# Patient Record
Sex: Female | Born: 1945
Health system: Southern US, Community
[De-identification: ages and names within clinical notes are randomized; demographics above are authoritative.]

## PROBLEM LIST (undated history)

## (undated) DIAGNOSIS — E669 Obesity, unspecified: Secondary | ICD-10-CM

## (undated) DIAGNOSIS — I10 Essential (primary) hypertension: Secondary | ICD-10-CM

## (undated) DIAGNOSIS — D699 Hemorrhagic condition, unspecified: Secondary | ICD-10-CM

## (undated) DIAGNOSIS — R079 Chest pain, unspecified: Secondary | ICD-10-CM

## (undated) DIAGNOSIS — N959 Unspecified menopausal and perimenopausal disorder: Secondary | ICD-10-CM

## (undated) DIAGNOSIS — E7800 Pure hypercholesterolemia, unspecified: Secondary | ICD-10-CM

## (undated) DIAGNOSIS — D219 Benign neoplasm of connective and other soft tissue, unspecified: Secondary | ICD-10-CM

## (undated) DIAGNOSIS — E119 Type 2 diabetes mellitus without complications: Secondary | ICD-10-CM

## (undated) DIAGNOSIS — F411 Generalized anxiety disorder: Secondary | ICD-10-CM

## (undated) HISTORY — DX: Chest pain, unspecified: R07.9

## (undated) HISTORY — PX: BREAST BIOPSY: SHX20

## (undated) HISTORY — DX: Type 2 diabetes mellitus without complications: E11.9

## (undated) HISTORY — PX: OVARIAN CYST SURGERY: SHX726

## (undated) HISTORY — DX: Obesity, unspecified: E66.9

## (undated) HISTORY — PX: FOOT SURGERY: SHX648

## (undated) HISTORY — PX: OOPHORECTOMY: SHX86

## (undated) HISTORY — DX: Generalized anxiety disorder: F41.1

## (undated) HISTORY — PX: SALPINGOOPHORECTOMY: SHX82

## (undated) HISTORY — DX: Essential (primary) hypertension: I10

## (undated) HISTORY — DX: Pure hypercholesterolemia, unspecified: E78.00

## (undated) HISTORY — PX: MOUTH SURGERY: SHX715

## (undated) HISTORY — DX: Hemorrhagic condition, unspecified: D69.9

## (undated) HISTORY — PX: CHOLECYSTECTOMY: SHX55

## (undated) HISTORY — DX: Benign neoplasm of connective and other soft tissue, unspecified: D21.9

## (undated) HISTORY — DX: Unspecified menopausal and perimenopausal disorder: N95.9

## (undated) HISTORY — PX: CATARACT EXTRACTION, BILATERAL: SHX1313

---

## 1981-10-09 HISTORY — PX: ABDOMINAL HYSTERECTOMY: SHX81

## 1999-03-18 ENCOUNTER — Encounter: Admission: RE | Admit: 1999-03-18 | Discharge: 1999-06-16 | Payer: Self-pay | Admitting: Internal Medicine

## 1999-06-21 ENCOUNTER — Encounter: Admission: RE | Admit: 1999-06-21 | Discharge: 1999-09-19 | Payer: Self-pay | Admitting: Internal Medicine

## 1999-12-28 ENCOUNTER — Encounter: Admission: RE | Admit: 1999-12-28 | Discharge: 1999-12-28 | Payer: Self-pay | Admitting: Family Medicine

## 1999-12-28 ENCOUNTER — Encounter: Payer: Self-pay | Admitting: Family Medicine

## 2000-02-27 ENCOUNTER — Ambulatory Visit (HOSPITAL_COMMUNITY): Admission: RE | Admit: 2000-02-27 | Discharge: 2000-02-27 | Payer: Self-pay | Admitting: Gastroenterology

## 2000-03-06 ENCOUNTER — Ambulatory Visit (HOSPITAL_COMMUNITY): Admission: RE | Admit: 2000-03-06 | Discharge: 2000-03-06 | Payer: Self-pay | Admitting: Gastroenterology

## 2000-04-02 ENCOUNTER — Encounter: Payer: Self-pay | Admitting: General Surgery

## 2000-04-02 ENCOUNTER — Ambulatory Visit (HOSPITAL_COMMUNITY): Admission: RE | Admit: 2000-04-02 | Discharge: 2000-04-02 | Payer: Self-pay | Admitting: General Surgery

## 2000-05-11 ENCOUNTER — Inpatient Hospital Stay (HOSPITAL_COMMUNITY): Admission: EM | Admit: 2000-05-11 | Discharge: 2000-05-12 | Payer: Self-pay | Admitting: General Surgery

## 2001-10-11 ENCOUNTER — Other Ambulatory Visit: Admission: RE | Admit: 2001-10-11 | Discharge: 2001-10-11 | Payer: Self-pay | Admitting: Obstetrics and Gynecology

## 2002-06-11 ENCOUNTER — Encounter: Payer: Self-pay | Admitting: Family Medicine

## 2002-06-11 ENCOUNTER — Encounter: Admission: RE | Admit: 2002-06-11 | Discharge: 2002-06-11 | Payer: Self-pay | Admitting: Family Medicine

## 2003-03-19 ENCOUNTER — Other Ambulatory Visit: Admission: RE | Admit: 2003-03-19 | Discharge: 2003-03-19 | Payer: Self-pay | Admitting: Obstetrics and Gynecology

## 2003-05-12 ENCOUNTER — Encounter: Admission: RE | Admit: 2003-05-12 | Discharge: 2003-05-12 | Payer: Self-pay | Admitting: Family Medicine

## 2003-05-12 ENCOUNTER — Encounter: Payer: Self-pay | Admitting: Family Medicine

## 2003-07-21 ENCOUNTER — Encounter: Admission: RE | Admit: 2003-07-21 | Discharge: 2003-10-19 | Payer: Self-pay | Admitting: Family Medicine

## 2004-03-22 ENCOUNTER — Other Ambulatory Visit: Admission: RE | Admit: 2004-03-22 | Discharge: 2004-03-22 | Payer: Self-pay | Admitting: Obstetrics and Gynecology

## 2004-05-12 ENCOUNTER — Encounter: Admission: RE | Admit: 2004-05-12 | Discharge: 2004-05-12 | Payer: Self-pay | Admitting: Obstetrics and Gynecology

## 2004-08-19 ENCOUNTER — Ambulatory Visit: Payer: Self-pay | Admitting: Internal Medicine

## 2004-08-26 ENCOUNTER — Ambulatory Visit (HOSPITAL_COMMUNITY): Admission: RE | Admit: 2004-08-26 | Discharge: 2004-08-26 | Payer: Self-pay | Admitting: Internal Medicine

## 2005-04-19 ENCOUNTER — Other Ambulatory Visit: Admission: RE | Admit: 2005-04-19 | Discharge: 2005-04-19 | Payer: Self-pay | Admitting: Addiction Medicine

## 2005-06-05 ENCOUNTER — Encounter: Admission: RE | Admit: 2005-06-05 | Discharge: 2005-06-05 | Payer: Self-pay | Admitting: Obstetrics and Gynecology

## 2006-05-09 ENCOUNTER — Other Ambulatory Visit: Admission: RE | Admit: 2006-05-09 | Discharge: 2006-05-09 | Payer: Self-pay | Admitting: Obstetrics and Gynecology

## 2006-06-06 ENCOUNTER — Encounter: Admission: RE | Admit: 2006-06-06 | Discharge: 2006-06-06 | Payer: Self-pay | Admitting: Obstetrics and Gynecology

## 2007-07-02 ENCOUNTER — Encounter: Admission: RE | Admit: 2007-07-02 | Discharge: 2007-07-02 | Payer: Self-pay | Admitting: Obstetrics and Gynecology

## 2007-07-30 ENCOUNTER — Other Ambulatory Visit: Admission: RE | Admit: 2007-07-30 | Discharge: 2007-07-30 | Payer: Self-pay | Admitting: Obstetrics and Gynecology

## 2008-07-06 ENCOUNTER — Ambulatory Visit (HOSPITAL_BASED_OUTPATIENT_CLINIC_OR_DEPARTMENT_OTHER): Admission: RE | Admit: 2008-07-06 | Discharge: 2008-07-06 | Payer: Self-pay | Admitting: Obstetrics and Gynecology

## 2009-02-16 ENCOUNTER — Encounter: Payer: Self-pay | Admitting: Obstetrics and Gynecology

## 2009-02-16 ENCOUNTER — Ambulatory Visit: Payer: Self-pay | Admitting: Obstetrics and Gynecology

## 2009-02-16 ENCOUNTER — Other Ambulatory Visit: Admission: RE | Admit: 2009-02-16 | Discharge: 2009-02-16 | Payer: Self-pay | Admitting: Obstetrics and Gynecology

## 2009-02-25 ENCOUNTER — Ambulatory Visit: Payer: Self-pay | Admitting: Obstetrics and Gynecology

## 2009-03-09 ENCOUNTER — Ambulatory Visit: Payer: Self-pay | Admitting: Obstetrics and Gynecology

## 2009-07-20 ENCOUNTER — Ambulatory Visit (HOSPITAL_BASED_OUTPATIENT_CLINIC_OR_DEPARTMENT_OTHER): Admission: RE | Admit: 2009-07-20 | Discharge: 2009-07-20 | Payer: Self-pay | Admitting: Family Medicine

## 2009-07-20 ENCOUNTER — Ambulatory Visit: Payer: Self-pay | Admitting: Diagnostic Radiology

## 2010-07-27 ENCOUNTER — Ambulatory Visit (HOSPITAL_BASED_OUTPATIENT_CLINIC_OR_DEPARTMENT_OTHER): Admission: RE | Admit: 2010-07-27 | Discharge: 2010-07-27 | Payer: Self-pay | Admitting: Family Medicine

## 2010-07-27 ENCOUNTER — Ambulatory Visit: Payer: Self-pay | Admitting: Diagnostic Radiology

## 2010-09-16 ENCOUNTER — Other Ambulatory Visit
Admission: RE | Admit: 2010-09-16 | Discharge: 2010-09-16 | Payer: Self-pay | Source: Home / Self Care | Admitting: Obstetrics and Gynecology

## 2010-09-16 ENCOUNTER — Ambulatory Visit: Payer: Self-pay | Admitting: Women's Health

## 2011-02-10 ENCOUNTER — Other Ambulatory Visit: Payer: Self-pay | Admitting: Family Medicine

## 2011-02-24 NOTE — Discharge Summary (Signed)
[facility]  Patient:    Sydney Avila, Sydney Avila                      MRN: [id number] Adm. Date:  [id number] Disc. Date: [id number] Attending:  Arlis Porta CC:         Talmadge Coventry, M.D.  Anselmo Rod, M.D.   Discharge Summary  PRINCIPAL DISCHARGE DIAGNOSIS:  Gastroesophageal reflux disease.  SECONDARY DIAGNOSES: 1. Small hiatal hernia. 2. Right shoulder strain. 3. Asthma.  PROCEDURES:  Laparoscopic Nissen fundoplication with hiatal hernia repair.  REASON FOR ADMISSION:  This 65 year old female has significant gastroesophageal reflux disease with supralaryngeal manifestations including bronchial asthma.  She was admitted for elective Nissen fundoplication.  HOSPITAL COURSE:  She underwent the above procedure and, postoperatively, she complains of some point tenderness in her right shoulder with some pain.  She had no significant nausea or vomiting.  Her asthma was well controlled.  She had no wheezing, and she was placed back on her medications and p.r.n. albuterol.  She had full range of motion of her right shoulder.  The pain ______  the shoulder was improved.  On postoperative day #2, she was afebrile, tolerating a full liquid diet, and was able to be discharged.  It was felt that the right shoulder strain was potentially secondary to positioning on the operative table.  DISPOSITION:  Discharged to home.  CONDITION ON DISCHARGE:  Satisfactory.  FOLLOW-UP:  She will follow up with me in the office in two weeks.  DIET:  Full liquids.  ACTIVITY:  She was given activity restrictions.  MEDICATIONS:  She is to continue all her home medicines, except stop taking her antireflux medicine.  She was given Tylox for pain, Phenergan suppositories p.r.n., and told to take Advil p.r.n. for her right shoulder strain. DD:  05/12/00 TD:  05/14/00 Job: [id number]

## 2011-02-24 NOTE — Procedures (Signed)
[facility]  Patient:    Sydney Avila, Sydney Avila                      MRN: [id number] Proc. Date: 03/06/00 Adm. Date:  [id number] Attending:  Charna Elizabeth CC:         Talmadge Coventry, M.D.             Adolph Pollack, M.D.                           Procedure Report  DATE OF BIRTH:  February ______, 1947  PROCEDURE PERFORMED:  Esophagogastroduodenoscopy.  ENDOSCOPIST:  Anselmo Rod, M.D.  INSTRUMENT USED:  Olympus video panendoscope.  INDICATIONS:  Epigastric pain and reflux in a 65 year old white female, whose DeMeester score on a 24-hour pH study was 39.3.  Patient is being considered for a Nissen fundoplication.  Rule out peptic ulcer disease, esophagitis, gastritis, etc.  PREPROCEDURE PREPARATION:  Informed consent was procured from the patient. The patient was fasted for 8 hours prior to the procedure.  PREPROCEDURE PHYSICAL:  Patient has stable vital signs.  NECK:  Supple.  CHEST:  Clear to auscultation. S1, S2 regular.  ABDOMEN:  Soft with normal abdominal bowel sounds.  DESCRIPTION OF PROCEDURE:  The patient was placed in left lateral decubitus position and sedated with 70 mg of Demerol and 5 mg of Versed intravenously. Once the patient was adequately sedated and maintained on low-flow oxygen and continuous cardiac monitoring, the Olympus video panendoscope was advanced through the mouth piece, over the tongue into the esophagus under direct vision.  The entire esophagus appeared normal without evidence of rings, strictures, masses, lesions, esophagitis or Barretts mucosa.  The scope was then advanced into the stomach.  There was moderate antral gastritis.  No frank ulcers, erosions or masses were seen.  The duodenal bulb and the small bowel distal to the bulb appeared normal up to 60 cm.  A small hiatal hernia was seen on retroflexion in the high cardia.  The patient tolerated the procedure well without  complications.  IMPRESSION: 1. Small hiatal hernia. 2. Moderate antral gastritis. 3. Otherwise normal EGD.  RECOMMENDATIONS: 1. The patient has been advised to follow up with Dr. Abbey Chatters for    preoperative evaluation for a Nissen fundoplication. 2. Continue PPI for now. 3. Avoid all nonsteroidals. 4. Outpatient follow-up in the next two weeks. DD:  03/06/00 TD:  03/07/00 Job: [id number]

## 2011-02-24 NOTE — H&P (Signed)
[facility]  Patient:    Sydney Avila                       MRN: [id number] Adm. Date:  [id number] Attending:  Arlis Porta                         History and Physical  REASON FOR ADMISSION:  Elective Nissen fundoplication.  HISTORY OF PRESENT ILLNESS:  Ms. Hauschild is a 65 year old female who has been having progressively increasing heartburn from gastroesophageal reflux.  She had started taking over-the-counter medications for this and exercising and noticed that during exercise, she was getting short of breath.  She subsequently started on prescription for her reflux and then was diagnosed with bronchial asthma and had to be treated with inhaler and steroids.  She was started on a proton pump inhibitor and gave her some relief for the heartburn, but she still had problems with what was felt to be reflux-induced asthma and has been treated by Dr. Charlcie Cradle. Delford Field.  She has been evaluated by Dr. Anselmo Rod and underwent a 24-hour pH study, which was abnormal, showing a DeMeester score of 39.3, with the normal being less than 22.  Her esophageal manometry demonstrated normal esophageal motility; however, her lower esophageal sphincter pressure was borderline-low at 15, with the low being 10.  She had a normal gastric emptying scan and she has had a previous cholecystectomy.  She is admitted now for elective fundoplication.  PAST MEDICAL HISTORY: 1. Bronchial asthma. 2. Hiatal hernia. 3. Gastroesophageal reflux disease.  PREVIOUS OPERATIONS:  Laparoscopic cholecystectomy; partial hysterectomy; completion of hysterectomy; tonsillectomy.  ALLERGIES:  SULFA MEDICATIONS.  MEDICINES:  Prevacid, Allegra, Estrace and Questran p.r.n. diarrhea.  SOCIAL HISTORY:  She is divorced.  She works for [employer] [place].  No tobacco or alcohol use.  FAMILY HISTORY:  Father died from complications of interstitial lung disease; he also had heart  problems and diabetes.  Her mother died from cancer.  REVIEW OF SYSTEMS:  Positive for the diarrhea, which is treated with Questran p.r.n., and is felt possibly secondary to post-cholecystectomy diarrhea.  PHYSICAL EXAMINATION:  GENERAL:  A slightly to moderately obese female in no acute distress, pleasant and cooperative.  Weight was 204 pounds in the office.  HEENT:  Eyes:  Extraocular motions were intact and sclerae clear.  NECK:  No palpable masses or thyromegaly.  CARDIOVASCULAR:  Heart demonstrated an increased rate with a regular rhythm and a soft systolic murmur.  There is no lower extremity edema present.  She had palpable pedal pulses.  RESPIRATORY:  Breath sounds are equal and clear and respirations nonlabored. No wheezing present.  ABDOMEN:  Soft with a lower midline scar and multiple small upper abdominal scars.  No tenderness or masses and no hernia present.  MUSCULOSKELETAL:  Full range of motion of extremities with a normal station and gait.  IMPRESSION:  Gastroesophageal reflux disease with supralaryngeal complications including bronchial asthma.  She has fairly adequate therapy with proton pump inhibitor but still had breakthrough problems with asthma which is felt to be reflux induced.  PLAN:  Laparoscopic and possibly open Nissen fundoplication.  The procedure and the risks including, but not limited to, bleeding, infection, injury to stomach, esophagus or intestines, and the risks of anesthetic were explained to her; also explained to her was the fact that there is a 90% success rate of the operation but that means  that 10% of folk may not get improvement.  She seems to understand to all of this and agrees to proceed. DD:  05/10/00 TD:  05/10/00 Job: [id number]

## 2011-02-24 NOTE — Op Note (Signed)
[facility]  Patient:    Sydney Avila                       MRN: [id number] Proc. Date: 05/10/00 Adm. Date:  [id number] Attending:  Arlis Porta CC:         Talmadge Coventry, M.D.  Anselmo Rod, M.D.  Charlcie Cradle Delford Field, M.D. [facility]   Operative Report  PREOPERATIVE DIAGNOSIS:  Gastroesophageal reflux disease with small hiatal hernia.  POSTOPERATIVE DIAGNOSIS:  Gastroesophageal reflux disease with small hiatal hernia.  PROCEDURE:  Laparoscopic hiatal hernia repair and Nissen fundoplication.  SURGEON:  Adolph Pollack, M.D.  ASSISTANT:  Currie Paris, M.D.  ANESTHESIA:  General.  INDICATIONS:  A 65 year old female who has had gastroesophageal reflux disease and ______ manifestations including bronchial asthma.  She still has difficulty with control.  She has a positive pH study and a borderline lower esophageal sphincter pressure.  She has normal esophageal motility.  She now presents for Nissen fundoplication and hiatal hernia repair.  DESCRIPTION OF PROCEDURE:  She was placed supine on the operating table and a general anesthetic was administered.  The abdomen was sterilely prepped and draped.  A small epigastric incision was made, incising the skin sharply.  The subcutaneous tissues were dissected bluntly down to the fascia and a 1 cm incision made in the fascia.  The peritoneal cavity was entered bluntly and under direct vision.  A pursestring suture of 0 Vicryl was placed around the fascial edges.  A Hassan trocar was introduced through the peritoneal cavity and pneumoperitoneum created by insufflation of CO2 gas.  Next, a 30 degree laparoscope was introduced into the peritoneal cavity and I could see some adhesions from previous laparoscopic cholecystectomy.  A 5 mm trocar was placed in the right lateral abdomen and a self-retaining ______ retractor was placed through this and retracting the left lobe of the  liver, exposing the gastroesophageal junction.  Next, an 11 mm trocar was placed through a left upper quadrant incision and two 11 mm trocars were placed through the two right upper quadrant incisions.  The thin gastrohepatic ligament was divided up to the level of the _____ ring was freed up anteriorly by using harmonic scalpel.  I then separated the right crus and the esophagus with blunt dissection, and began a retroesophageal dissection.  I subsequently directed my attention over to the right nasogastrics and mid fundus along the angle of His all the way to the left crus, freeing up the proximal half of the fundus.  Also, I was able to skeletonize the left crus, and I then made a retroesophageal window without difficulty.  There was a small hiatal hernia present and this was easily closed with a single 0 Monocryl suture.  I then passed the fundus through the retroesophageal window and created a left and right leaf for wrap.  Before the wrap was performed however, a size 50 lighted bougie was placed through the gastroesophageal junction into the stomach.  Next, using a size 0 nonabsorbable suture, a 360 degree fundoplication was performed, going from 2.5 cm using three sutures.  The first two proximal sutures incorporated the esophagus and the last one was suture from the right to the left leaf from the wrap.  The dilator was then removed and the wrap was floppy under no tension, and the sutures remained in the 10 oclock to 11 oclock position.  I then anchored the right leaf on the wrap to  the right crus with a single 0 nonabsorbable suture.  I then inspected the area and no bleeding was noted.  The liver retractor was removed and no liver trauma was noted.  All the trocars were removed and the pneumoperitoneum released.  The epigastric fascial defect was closed by tightening up and tying down the pursestring suture.  The skin incisions were closed with 4-0  Monocryl subcuticular stitches followed by Steri-Strips and sterile dressings.  The patient tolerated the procedure well without any apparent complications and was taken to the recovery room in satisfactory condition. DD:  05/10/00 TD:  05/11/00 Job: [id number]

## 2011-05-18 ENCOUNTER — Other Ambulatory Visit: Payer: Self-pay | Admitting: *Deleted

## 2011-05-19 MED ORDER — ESTRADIOL 0.06 MG/24HR TD PTWK: 1.0000 | MEDICATED_PATCH | TRANSDERMAL | Status: DC

## 2011-05-22 ENCOUNTER — Other Ambulatory Visit: Payer: Self-pay | Admitting: *Deleted

## 2011-05-22 MED ORDER — ALPRAZOLAM 0.25 MG PO TABS: 0.2500 mg | ORAL_TABLET | Freq: Three times a day (TID) | ORAL | Status: AC

## 2011-05-22 NOTE — Telephone Encounter (Signed)
Called in refill for Alprazolam 0.25 #30 no refills to [facility] [phone].

## 2011-06-20 ENCOUNTER — Other Ambulatory Visit (HOSPITAL_BASED_OUTPATIENT_CLINIC_OR_DEPARTMENT_OTHER): Payer: Self-pay | Admitting: Family Medicine

## 2011-06-20 DIAGNOSIS — Z139 Encounter for screening, unspecified: Secondary | ICD-10-CM

## 2011-06-23 ENCOUNTER — Other Ambulatory Visit: Payer: Self-pay

## 2011-06-23 MED ORDER — ALPRAZOLAM 0.25 MG PO TABS: 0.2500 mg | ORAL_TABLET | Freq: Every evening | ORAL | Status: DC | PRN

## 2011-06-23 NOTE — Telephone Encounter (Signed)
FAXED TO PHARMACY 06-22-11.

## 2011-07-24 ENCOUNTER — Other Ambulatory Visit: Payer: Self-pay | Admitting: Women's Health

## 2011-07-24 DIAGNOSIS — F419 Anxiety disorder, unspecified: Secondary | ICD-10-CM

## 2011-07-24 DIAGNOSIS — E8940 Asymptomatic postprocedural ovarian failure: Secondary | ICD-10-CM

## 2011-07-24 MED ORDER — ESTRADIOL 0.06 MG/24HR TD PTWK: 1.0000 | MEDICATED_PATCH | TRANSDERMAL | Status: DC

## 2011-07-24 NOTE — Telephone Encounter (Signed)
Left message on patient's cell but that prescription request for phoned into [facility] per her request.

## 2011-07-31 ENCOUNTER — Ambulatory Visit (HOSPITAL_BASED_OUTPATIENT_CLINIC_OR_DEPARTMENT_OTHER): Payer: Self-pay

## 2011-08-01 ENCOUNTER — Ambulatory Visit (HOSPITAL_BASED_OUTPATIENT_CLINIC_OR_DEPARTMENT_OTHER)
Admission: RE | Admit: 2011-08-01 | Discharge: 2011-08-01 | Disposition: A | Discharge status: Home / Self Care | Payer: Managed Care, Other (non HMO) | Source: Ambulatory Visit | Attending: Family Medicine | Admitting: Family Medicine

## 2011-08-01 DIAGNOSIS — Z1231 Encounter for screening mammogram for malignant neoplasm of breast: Secondary | ICD-10-CM

## 2011-08-01 DIAGNOSIS — Z139 Encounter for screening, unspecified: Secondary | ICD-10-CM

## 2011-10-14 ENCOUNTER — Other Ambulatory Visit: Payer: Self-pay | Admitting: Women's Health

## 2011-10-16 NOTE — Telephone Encounter (Signed)
Patient is overdue for yearly. Last one 09/16/10. Chart is on your desk.

## 2011-10-16 NOTE — Telephone Encounter (Signed)
Have pt schedule aex

## 2011-10-16 NOTE — Telephone Encounter (Signed)
Pharmacy notified by telephone that refill request okay.  I will call patient to schedule CE for her.

## 2011-10-19 ENCOUNTER — Other Ambulatory Visit: Payer: Self-pay

## 2011-10-19 MED ORDER — ALPRAZOLAM 0.25 MG PO TABS: 0.2500 mg | ORAL_TABLET | Freq: Every evening | ORAL | Status: DC | PRN

## 2011-10-19 NOTE — Telephone Encounter (Signed)
Sydney Avila, Her last RGCE 09/17/11 with you. Nothing scheduled. I will call her and schedule CE.

## 2011-10-19 NOTE — Telephone Encounter (Signed)
Addended by: Keenan Bachelor on: 10/19/2011 04:47 PM   Modules accepted: Orders

## 2011-10-19 NOTE — Telephone Encounter (Signed)
We received fax request for Sydney Avila for generic Alprazolam 0.25mg .  [place] okayed it and I told her I would call patient and schedule CE. I contacted patient and told her [place] had okayed refill on RX but asked that I call her to schedule CE since last seen last Dec.  Patient said she was not sure she was coming back here and it would not be [place] it would be Dr. Reece Agar if she did. She said Dr. Sigmund Hazel is who prescribes her Xanax and she is not sure why I was calling her.  I told her we received faxed refill request with [place] name on it and were just trying to take care of that.  She asked me for my name I told her my first name and she wanted my last name as well which I kindly provided.  Meantime she will check with Dr. Hyacinth Meeker for her Xanax and I did NOT refill it.

## 2011-10-19 NOTE — Telephone Encounter (Signed)
Deleted the refill that Retta Mac and contact [facility] and told them NOT to send that refill request here again.

## 2011-10-19 NOTE — Telephone Encounter (Signed)
Needs aex 

## 2012-06-11 ENCOUNTER — Encounter: Payer: Self-pay | Admitting: Gynecology

## 2012-06-11 DIAGNOSIS — E118 Type 2 diabetes mellitus with unspecified complications: Secondary | ICD-10-CM | POA: Insufficient documentation

## 2012-06-11 DIAGNOSIS — D219 Benign neoplasm of connective and other soft tissue, unspecified: Secondary | ICD-10-CM | POA: Insufficient documentation

## 2012-06-12 ENCOUNTER — Encounter: Payer: Managed Care, Other (non HMO) | Admitting: Obstetrics and Gynecology

## 2012-06-19 ENCOUNTER — Encounter: Payer: Self-pay | Admitting: Obstetrics and Gynecology

## 2012-06-19 ENCOUNTER — Ambulatory Visit (INDEPENDENT_AMBULATORY_CARE_PROVIDER_SITE_OTHER): Payer: Managed Care, Other (non HMO) | Admitting: Obstetrics and Gynecology

## 2012-06-19 VITALS — BP 124/80 | Ht 62.0 in | Wt 179.0 lb

## 2012-06-19 DIAGNOSIS — Z01419 Encounter for gynecological examination (general) (routine) without abnormal findings: Secondary | ICD-10-CM

## 2012-06-19 DIAGNOSIS — E8941 Symptomatic postprocedural ovarian failure: Secondary | ICD-10-CM

## 2012-06-19 DIAGNOSIS — E8940 Asymptomatic postprocedural ovarian failure: Secondary | ICD-10-CM

## 2012-06-19 DIAGNOSIS — N898 Other specified noninflammatory disorders of vagina: Secondary | ICD-10-CM

## 2012-06-19 LAB — WET PREP FOR TRICH, YEAST, CLUE: Clue Cells Wet Prep HPF POC: NONE SEEN

## 2012-06-19 MED ORDER — ESTRADIOL 0.06 MG/24HR TD PTWK: 1.0000 | MEDICATED_PATCH | TRANSDERMAL | Status: DC

## 2012-06-19 MED ORDER — KETOCONAZOLE 200 MG PO TABS: 200.0000 mg | ORAL_TABLET | Freq: Two times a day (BID) | ORAL | Status: AC

## 2012-06-19 MED ORDER — TERCONAZOLE 0.8 % VA CREA: 1.0000 | TOPICAL_CREAM | Freq: Every day | VAGINAL | Status: AC

## 2012-06-19 NOTE — Progress Notes (Signed)
Patient came to see me today for her annual GYN exam. She remains on Climara patch for hot flashes. She only needs to take it during the summer. She is having significant vaginal itching and discharge. She had a bad case of poison sumac. She was on systemic steroids. The vaginal infection started then. She took 3 days of Diflucan. She has been intermittently better. She has had no vaginal bleeding. She has had no pelvic pain. She is due for her mammogram in November. She has had 2 normal bone densities. Her last bone density was 2010. She had a TAH, BSO in 1983 for fibroids. She has always had normal Pap smears. Her last Pap smear was 2011. She does her lab through her PCP.  HEENT: Within normal limits.Kennon Portela present. Neck: No masses. Supraclavicular lymph nodes: Not enlarged. Breasts: Examined in both sitting and lying position. Symmetrical without skin changes or masses. Abdomen: Soft no masses guarding or rebound. No hernias. Pelvic: External within normal limits. BUS within normal limits. Vaginal examination shows good estrogen effect, no cystocele enterocele or rectocele. There is some vaginal discharge consistent with yeast but not confirmed on wet prep. Cervix and uterus absent. Adnexa within normal limits. Rectovaginal confirmatory. Extremities within normal limits.  Assessment: #1. Yeast vaginitis #2. Menopausal symptoms  Plan: Terconazole 3 cream. Patient also has a prescription for Nizoral if needed. Climara patch refilled. She will use as above. No Pap done.The new Pap smear guidelines were discussed with the patient.

## 2012-06-19 NOTE — Patient Instructions (Signed)
Mammogram in November

## 2012-06-20 LAB — URINALYSIS W MICROSCOPIC + REFLEX CULTURE
Bilirubin Urine: NEGATIVE
Casts: NONE SEEN
Crystals: NONE SEEN
Ketones, ur: NEGATIVE mg/dL
Nitrite: NEGATIVE
Protein, ur: NEGATIVE mg/dL
Urobilinogen, UA: 0.2 mg/dL (ref 0.0–1.0)

## 2012-06-21 LAB — URINE CULTURE

## 2012-06-24 ENCOUNTER — Telehealth: Payer: Self-pay | Admitting: *Deleted

## 2012-06-24 NOTE — Telephone Encounter (Signed)
Pt informed with the below note. 

## 2012-06-24 NOTE — Telephone Encounter (Signed)
Left message for pt to call.

## 2012-06-24 NOTE — Telephone Encounter (Signed)
Initially I thought it was be bv but after the exam I thought it was yeast. I gave her terconazole 3 cream and the Nizoral was only if the terconazole did not work. Let me know if her symptoms persist.

## 2012-06-24 NOTE — Telephone Encounter (Signed)
Pt was seen for annual on 06/19/12, pt said you told her that she had BV infection. Pt asked should the ketoconazole take care of BV?

## 2012-06-26 ENCOUNTER — Encounter: Payer: Self-pay | Admitting: Obstetrics and Gynecology

## 2012-07-02 ENCOUNTER — Other Ambulatory Visit: Payer: Self-pay | Admitting: Obstetrics and Gynecology

## 2012-07-02 DIAGNOSIS — Z1231 Encounter for screening mammogram for malignant neoplasm of breast: Secondary | ICD-10-CM

## 2012-07-08 ENCOUNTER — Other Ambulatory Visit: Payer: Self-pay | Admitting: Dermatology

## 2012-07-24 LAB — HM PAP SMEAR: HM Pap smear: NORMAL

## 2012-08-01 ENCOUNTER — Ambulatory Visit (HOSPITAL_BASED_OUTPATIENT_CLINIC_OR_DEPARTMENT_OTHER): Payer: Managed Care, Other (non HMO)

## 2012-08-15 ENCOUNTER — Ambulatory Visit (HOSPITAL_BASED_OUTPATIENT_CLINIC_OR_DEPARTMENT_OTHER)
Admission: RE | Admit: 2012-08-15 | Discharge: 2012-08-15 | Disposition: A | Discharge status: Home / Self Care | Payer: Managed Care, Other (non HMO) | Source: Ambulatory Visit | Attending: Obstetrics and Gynecology | Admitting: Obstetrics and Gynecology

## 2012-08-15 DIAGNOSIS — Z1231 Encounter for screening mammogram for malignant neoplasm of breast: Secondary | ICD-10-CM | POA: Insufficient documentation

## 2012-10-09 LAB — HM DIABETES EYE EXAM

## 2013-04-30 ENCOUNTER — Encounter: Payer: Self-pay | Admitting: Cardiovascular Disease

## 2013-06-30 ENCOUNTER — Encounter: Payer: Self-pay | Admitting: *Deleted

## 2013-06-30 ENCOUNTER — Ambulatory Visit (INDEPENDENT_AMBULATORY_CARE_PROVIDER_SITE_OTHER): Payer: Managed Care, Other (non HMO) | Admitting: Cardiovascular Disease

## 2013-06-30 VITALS — BP 128/78 | HR 78 | Ht 62.0 in | Wt 180.8 lb

## 2013-06-30 DIAGNOSIS — R0789 Other chest pain: Secondary | ICD-10-CM

## 2013-06-30 NOTE — Patient Instructions (Addendum)
Your physician recommends that you schedule a follow-up appointment in: as needed basis   Your physician recommends that you continue on your current medications as directed. Please refer to the Current Medication list given to you today.   

## 2013-06-30 NOTE — Progress Notes (Signed)
Sydney Avila Date of Birth  [birth year]       [facility] Office [address], [address]  [address], [address] [place], [place]  [address]   [place], [place]  [address] [phone]     [phone]   Fax  [phone]    Fax [phone]  Problem List: 1. Atypical CP ( worse with picking up bag of fertilizer)  2. diabetes mellitus 3. Hyperlipidemia 4. Anxiety 5. Obesity 6. Hypertension  History of Present Illness:  Sydney Avila is a 67 yo with multiple risk factors for CAD.  Her brother recently had CABG and has been begging both his sisters to be evaluated.  Her glucose levels have been elevated ( was using raw sugar).  Her new diabetic meds have been changed.  She used to exercise regularly but has not been exercising regularly as she used to.  She walks 15 minutes twice a day.  She has no symptoms with her walking.  She has lost several pounds.  A screening ECG showed poor R wave progression.  She denies any CP or dyspnea.    She had some tightness that occurred immediately when she picked up a bag of fertilizer this past summer.    Current Outpatient Prescriptions on File Prior to Visit  Medication Sig Dispense Refill  . ALPRAZolam (XANAX) 0.25 MG tablet TAKE 1 TABLET BY MOUTH EVERY 8 HOURS AS NEEDED  30 tablet  0  . Cholestyramine (QUESTRAN PO) Take by mouth.      . estradiol (CLIMARA) 0.06 MG/24HR Place 1 patch onto the skin once a week.  12 patch  3  . lisinopril (PRINIVIL,ZESTRIL) 10 MG tablet Take 10 mg by mouth daily.      . metFORMIN (GLUCOPHAGE) 500 MG tablet Take 500 mg by mouth 2 (two) times daily with a meal.      . simvastatin (ZOCOR) 20 MG tablet Take 20 mg by mouth every evening.       No current facility-administered medications on file prior to visit.    Allergies  Allergen Reactions  . Sulfa Antibiotics     Past Medical History  Diagnosis Date  . Fibroid   . HTN (hypertension)   . Hypercholesterolemia   . Obesity   .  Anxiety state, unspecified   . Unspecified menopausal and postmenopausal disorder   . Chest pain, unspecified   . Unspecified hemorrhagic conditions   . Type II or unspecified type diabetes mellitus without mention of complication, not stated as uncontrolled     Past Surgical History  Procedure Laterality Date  . Oophorectomy      BSO  . Abdominal hysterectomy  1983    TAH BSO  . Cholecystectomy    . Mouth surgery    . Ovarian cyst surgery    . Foot surgery    . Cataract extraction, bilateral    . Salpingoophorectomy      History  Smoking status  . Former Smoker  Smokeless tobacco  . Not on file    History  Alcohol Use  . Yes    Comment: Rare    Family History  Problem Relation Age of Onset  . Uterine cancer Mother   . Diabetes Father   . Heart disease Father   . Diabetes Maternal Grandmother   . Diabetes Maternal Grandfather     Reviw of Systems:  Reviewed in the HPI.  All other systems are negative.  Physical Exam: Blood  pressure 128/78, pulse 78, height 5\' 2"  (1.575 m), weight 180 lb 12.8 oz (82.01 kg). General: Well developed, well nourished, in no acute distress.  Head: Normocephalic, atraumatic, sclera non-icteric, mucus membranes are moist,   Neck: Supple. Carotids are 2 + without bruits. No JVD   Lungs: Clear   Heart: RR, normal S1, S2.   Abdomen: Soft, non-tender, non-distended with normal bowel sounds.  Msk:  Strength and tone are normal   Extremities: No clubbing or cyanosis. No edema.  Distal pedal pulses are 2+ and equal    Neuro: CN II - XII intact.  Alert and oriented X 3.   Psych:  Normal   ECG: Sept. 22, 2014:  NSR at 49.  Low voltage.   Assessment / Plan:

## 2013-06-30 NOTE — Assessment & Plan Note (Signed)
Sydney Avila presents with atypical CP that occurred when she was lifting up a bag of fertilizer. The Chest pain was clearly muscle skeletal. She does have some risk factors for coronary artery disease but these seemed to be well-controlled. Her LDL is 48. She notes that her diabetes needs better control and she is working with her medical doctor on this. Her triglycerides are elevated but this is related to her diabetes mellitus.  Her EKG shows poor R-wave progression which I suspect is due to lead placement abnormalities.    there is no evidence of a previous myocardial infarction.  She exercises fairly regularly without chest pain or dyspnea.  At this point given her lack of symptoms, I do not think that stress testing would be productive. Her pretest probability of having significant coronary artery disease is low.

## 2013-07-14 ENCOUNTER — Other Ambulatory Visit (HOSPITAL_BASED_OUTPATIENT_CLINIC_OR_DEPARTMENT_OTHER): Payer: Self-pay | Admitting: Family Medicine

## 2013-07-14 DIAGNOSIS — Z1231 Encounter for screening mammogram for malignant neoplasm of breast: Secondary | ICD-10-CM

## 2013-07-30 LAB — HM DIABETES EYE EXAM: HM Diabetic Eye Exam: NORMAL

## 2013-08-20 ENCOUNTER — Ambulatory Visit (HOSPITAL_BASED_OUTPATIENT_CLINIC_OR_DEPARTMENT_OTHER): Payer: Managed Care, Other (non HMO)

## 2013-09-08 ENCOUNTER — Other Ambulatory Visit: Payer: Self-pay | Admitting: Obstetrics and Gynecology

## 2013-09-10 ENCOUNTER — Ambulatory Visit (HOSPITAL_BASED_OUTPATIENT_CLINIC_OR_DEPARTMENT_OTHER): Payer: Managed Care, Other (non HMO)

## 2013-09-26 ENCOUNTER — Ambulatory Visit (HOSPITAL_BASED_OUTPATIENT_CLINIC_OR_DEPARTMENT_OTHER): Payer: Managed Care, Other (non HMO)

## 2013-10-15 ENCOUNTER — Ambulatory Visit (HOSPITAL_BASED_OUTPATIENT_CLINIC_OR_DEPARTMENT_OTHER)
Admission: RE | Admit: 2013-10-15 | Discharge: 2013-10-15 | Disposition: A | Discharge status: Home / Self Care | Payer: Managed Care, Other (non HMO) | Source: Ambulatory Visit | Attending: Family Medicine | Admitting: Family Medicine

## 2013-10-15 DIAGNOSIS — Z1231 Encounter for screening mammogram for malignant neoplasm of breast: Secondary | ICD-10-CM | POA: Insufficient documentation

## 2013-10-15 LAB — HM MAMMOGRAPHY: HM MAMMO: NORMAL

## 2013-10-24 ENCOUNTER — Telehealth: Payer: Self-pay

## 2013-10-24 NOTE — Telephone Encounter (Signed)
The patient is hoping to be seen as a new pt.  Do you want her added onto your schedule?

## 2013-10-24 NOTE — Telephone Encounter (Signed)
ok 

## 2013-10-24 NOTE — Telephone Encounter (Signed)
Called pt, lvmom.

## 2013-10-27 LAB — BASIC METABOLIC PANEL
BUN: 12 mg/dL (ref 4–21)
Creatinine: 0.8 mg/dL (ref 0.5–1.1)
GLUCOSE: 218 mg/dL
Potassium: 3.9 mmol/L (ref 3.4–5.3)
Sodium: 138 mmol/L (ref 137–147)

## 2013-10-27 LAB — LIPID PANEL
CHOLESTEROL: 107 mg/dL (ref 0–200)
HDL: 41 mg/dL (ref 35–70)
LDL CALC: 30 mg/dL
LDl/HDL Ratio: 2.6
Triglycerides: 179 mg/dL — AB (ref 40–160)

## 2013-10-27 LAB — CBC AND DIFFERENTIAL
HCT: 36 % (ref 36–46)
Hemoglobin: 12 g/dL (ref 12.0–16.0)
Platelets: 176 10*3/uL (ref 150–399)
WBC: 5.7 10*3/mL

## 2013-10-27 LAB — HEPATIC FUNCTION PANEL
ALK PHOS: 61 U/L (ref 25–125)
ALT: 21 U/L (ref 7–35)
AST: 18 U/L (ref 13–35)
Bilirubin, Total: 0.9 mg/dL

## 2013-10-27 LAB — HEMOGLOBIN A1C: Hgb A1c MFr Bld: 8.1 % — AB (ref 4.0–6.0)

## 2013-10-31 ENCOUNTER — Encounter: Payer: Self-pay | Admitting: Internal Medicine

## 2013-10-31 ENCOUNTER — Ambulatory Visit (INDEPENDENT_AMBULATORY_CARE_PROVIDER_SITE_OTHER): Payer: Managed Care, Other (non HMO) | Admitting: Internal Medicine

## 2013-10-31 VITALS — BP 136/88 | HR 81 | Temp 97.8°F | Resp 16 | Ht 62.0 in | Wt 182.0 lb

## 2013-10-31 DIAGNOSIS — E785 Hyperlipidemia, unspecified: Secondary | ICD-10-CM | POA: Insufficient documentation

## 2013-10-31 DIAGNOSIS — I10 Essential (primary) hypertension: Secondary | ICD-10-CM | POA: Insufficient documentation

## 2013-10-31 DIAGNOSIS — E669 Obesity, unspecified: Secondary | ICD-10-CM | POA: Insufficient documentation

## 2013-10-31 DIAGNOSIS — Z0000 Encounter for general adult medical examination without abnormal findings: Secondary | ICD-10-CM | POA: Insufficient documentation

## 2013-10-31 DIAGNOSIS — E1165 Type 2 diabetes mellitus with hyperglycemia: Principal | ICD-10-CM

## 2013-10-31 DIAGNOSIS — Z1231 Encounter for screening mammogram for malignant neoplasm of breast: Secondary | ICD-10-CM | POA: Insufficient documentation

## 2013-10-31 DIAGNOSIS — IMO0001 Reserved for inherently not codable concepts without codable children: Secondary | ICD-10-CM

## 2013-10-31 LAB — HM DIABETES FOOT EXAM

## 2013-10-31 MED ORDER — LISINOPRIL 10 MG PO TABS: 10.0000 mg | ORAL_TABLET | Freq: Every day | ORAL | Status: DC

## 2013-10-31 MED ORDER — CANAGLIFLOZIN-METFORMIN HCL 150-500 MG PO TABS: 1.0000 | ORAL_TABLET | Freq: Two times a day (BID) | ORAL | Status: DC

## 2013-10-31 NOTE — Patient Instructions (Signed)
Type 2 Diabetes Mellitus, Adult Type 2 diabetes mellitus, often simply referred to as type 2 diabetes, is a long-lasting (chronic) disease. In type 2 diabetes, the pancreas does not make enough insulin (a hormone), the cells are less responsive to the insulin that is made (insulin resistance), or both. Normally, insulin moves sugars from food into the tissue cells. The tissue cells use the sugars for energy. The lack of insulin or the lack of normal response to insulin causes excess sugars to build up in the blood instead of going into the tissue cells. As a result, high blood sugar (hyperglycemia) develops. The effect of high sugar (glucose) levels can cause many complications. Type 2 diabetes was also previously called adult-onset diabetes but it can occur at any age.  RISK FACTORS  A person is predisposed to developing type 2 diabetes if someone in the family has the disease and also has one or more of the following primary risk factors:  Overweight.  An inactive lifestyle.  A history of consistently eating high-calorie foods. Maintaining a normal weight and regular physical activity can reduce the chance of developing type 2 diabetes. SYMPTOMS  A person with type 2 diabetes may not show symptoms initially. The symptoms of type 2 diabetes appear slowly. The symptoms include:  Increased thirst (polydipsia).  Increased urination (polyuria).  Increased urination during the night (nocturia).  Weight loss. This weight loss may be rapid.  Frequent, recurring infections.  Tiredness (fatigue).  Weakness.  Vision changes, such as blurred vision.  Fruity smell to your breath.  Abdominal pain.  Nausea or vomiting.  Cuts or bruises which are slow to heal.  Tingling or numbness in the hands or feet. DIAGNOSIS Type 2 diabetes is frequently not diagnosed until complications of diabetes are present. Type 2 diabetes is diagnosed when symptoms or complications are present and when blood  glucose levels are increased. Your blood glucose level may be checked by one or more of the following blood tests:  A fasting blood glucose test. You will not be allowed to eat for at least 8 hours before a blood sample is taken.  A random blood glucose test. Your blood glucose is checked at any time of the day regardless of when you ate.  A hemoglobin A1c blood glucose test. A hemoglobin A1c test provides information about blood glucose control over the previous 3 months.  An oral glucose tolerance test (OGTT). Your blood glucose is measured after you have not eaten (fasted) for 2 hours and then after you drink a glucose-containing beverage. TREATMENT   You may need to take insulin or diabetes medicine daily to keep blood glucose levels in the desired range.  You will need to match insulin dosing with exercise and healthy food choices. The treatment goal is to maintain the before meal blood sugar (preprandial glucose) level at 70 130 mg/dL. HOME CARE INSTRUCTIONS   Have your hemoglobin A1c level checked twice a year.  Perform daily blood glucose monitoring as directed by your caregiver.  Monitor urine ketones when you are ill and as directed by your caregiver.  Take your diabetes medicine or insulin as directed by your caregiver to maintain your blood glucose levels in the desired range.  Never run out of diabetes medicine or insulin. It is needed every day.  Adjust insulin based on your intake of carbohydrates. Carbohydrates can raise blood glucose levels but need to be included in your diet. Carbohydrates provide vitamins, minerals, and fiber which are an essential part of   a healthy diet. Carbohydrates are found in fruits, vegetables, whole grains, dairy products, legumes, and foods containing added sugars.    Eat healthy foods. Alternate 3 meals with 3 snacks.  Lose weight if overweight.  Carry a medical alert card or wear your medical alert jewelry.  Carry a 15 gram  carbohydrate snack with you at all times to treat low blood glucose (hypoglycemia). Some examples of 15 gram carbohydrate snacks include:  Glucose tablets, 3 or 4   Glucose gel, 15 gram tube  Raisins, 2 tablespoons (24 grams)  Jelly beans, 6  Animal crackers, 8  Regular pop, 4 ounces (120 mL)  Gummy treats, 9  Recognize hypoglycemia. Hypoglycemia occurs with blood glucose levels of 70 mg/dL and below. The risk for hypoglycemia increases when fasting or skipping meals, during or after intense exercise, and during sleep. Hypoglycemia symptoms can include:  Tremors or shakes.  Decreased ability to concentrate.  Sweating.  Increased heart rate.  Headache.  Dry mouth.  Hunger.  Irritability.  Anxiety.  Restless sleep.  Altered speech or coordination.  Confusion.  Treat hypoglycemia promptly. If you are alert and able to safely swallow, follow the 15:15 rule:  Take 15 20 grams of rapid-acting glucose or carbohydrate. Rapid-acting options include glucose gel, glucose tablets, or 4 ounces (120 mL) of fruit juice, regular soda, or low fat milk.  Check your blood glucose level 15 minutes after taking the glucose.  Take 15 20 grams more of glucose if the repeat blood glucose level is still 70 mg/dL or below.  Eat a meal or snack within 1 hour once blood glucose levels return to normal.    Be alert to polyuria and polydipsia which are early signs of hyperglycemia. An early awareness of hyperglycemia allows for prompt treatment. Treat hyperglycemia as directed by your caregiver.  Engage in at least 150 minutes of moderate-intensity physical activity a week, spread over at least 3 days of the week or as directed by your caregiver. In addition, you should engage in resistance exercise at least 2 times a week or as directed by your caregiver.  Adjust your medicine and food intake as needed if you start a new exercise or sport.  Follow your sick day plan at any time you  are unable to eat or drink as usual.  Avoid tobacco use.  Limit alcohol intake to no more than 1 drink per day for nonpregnant women and 2 drinks per day for men. You should drink alcohol only when you are also eating food. Talk with your caregiver whether alcohol is safe for you. Tell your caregiver if you drink alcohol several times a week.  Follow up with your caregiver regularly.  Schedule an eye exam soon after the diagnosis of type 2 diabetes and then annually.  Perform daily skin and foot care. Examine your skin and feet daily for cuts, bruises, redness, nail problems, bleeding, blisters, or sores. A foot exam by a caregiver should be done annually.  Brush your teeth and gums at least twice a day and floss at least once a day. Follow up with your dentist regularly.  Share your diabetes management plan with your workplace or school.  Stay up-to-date with immunizations.  Learn to manage stress.  Obtain ongoing diabetes education and support as needed.  Participate in, or seek rehabilitation as needed to maintain or improve independence and quality of life. Request a physical or occupational therapy referral if you are having foot or hand numbness or difficulties with grooming,   dressing, eating, or physical activity. SEEK MEDICAL CARE IF:   You are unable to eat food or drink fluids for more than 6 hours.  You have nausea and vomiting for more than 6 hours.  Your blood glucose level is over 240 mg/dL.  There is a change in mental status.  You develop an additional serious illness.  You have diarrhea for more than 6 hours.  You have been sick or have had a fever for a couple of days and are not getting better.  You have pain during any physical activity.  SEEK IMMEDIATE MEDICAL CARE IF:  You have difficulty breathing.  You have moderate to large ketone levels. MAKE SURE YOU:  Understand these instructions.  Will watch your condition.  Will get help right away if  you are not doing well or get worse. Document Released: 09/25/2005 Document Revised: 06/19/2012 Document Reviewed: 04/23/2012 [employer] Patient Information 2014 [employer].  

## 2013-10-31 NOTE — Assessment & Plan Note (Signed)
Her BP is well controlled 

## 2013-10-31 NOTE — Progress Notes (Signed)
Subjective:    Patient ID: Sydney Avila, female    DOB: [birth year], 68 y.o.   MRN: [id number]  Diabetes She presents for her follow-up diabetic visit. She has type 2 diabetes mellitus. The initial diagnosis of diabetes was made 3 years ago. Her disease course has been worsening. There are no hypoglycemic associated symptoms. Pertinent negatives for hypoglycemia include no dizziness, headaches or tremors. There are no diabetic associated symptoms. Pertinent negatives for diabetes include no blurred vision, no chest pain, no fatigue, no foot paresthesias, no foot ulcerations, no polydipsia, no polyphagia, no polyuria, no visual change, no weakness and no weight loss. There are no hypoglycemic complications. Symptoms are stable. There are no diabetic complications. Current diabetic treatment includes oral agent (monotherapy). She is compliant with treatment most of the time. Her weight is stable. She is following a generally healthy diet. Meal planning includes avoidance of concentrated sweets. She has not had a previous visit with a dietician. She participates in exercise intermittently. Her home blood glucose trend is increasing steadily. Her breakfast blood glucose range is generally >200 mg/dl. Her lunch blood glucose range is generally >200 mg/dl. Her dinner blood glucose range is generally >200 mg/dl. Her highest blood glucose is >200 mg/dl. Her overall blood glucose range is >200 mg/dl. An ACE inhibitor/angiotensin II receptor blocker is being taken. She does not see a podiatrist.Eye exam is current.      Review of Systems  Constitutional: Negative.  Negative for fever, chills, weight loss, diaphoresis, appetite change and fatigue.  HENT: Negative.   Eyes: Negative.  Negative for blurred vision.  Respiratory: Negative.  Negative for cough, choking, chest tightness, shortness of breath, wheezing and stridor.   Cardiovascular: Negative.  Negative for chest pain, palpitations and leg swelling.    Gastrointestinal: Negative.  Negative for nausea, vomiting, abdominal pain, diarrhea, constipation and blood in stool.  Endocrine: Negative.  Negative for polydipsia, polyphagia and polyuria.  Genitourinary: Negative.   Skin: Negative.   Allergic/Immunologic: Negative.   Neurological: Negative.  Negative for dizziness, tremors, weakness, light-headedness and headaches.  Hematological: Negative.  Negative for adenopathy. Does not bruise/bleed easily.  Psychiatric/Behavioral: Negative.        Objective:   Physical Exam  Vitals reviewed. Constitutional: She is oriented to person, place, and time. She appears well-developed and well-nourished. No distress.  HENT:  Head: Normocephalic and atraumatic.  Mouth/Throat: Oropharynx is clear and moist. No oropharyngeal exudate.  Eyes: Conjunctivae are normal. Right eye exhibits no discharge. Left eye exhibits no discharge. No scleral icterus.  Neck: Normal range of motion. Neck supple. No JVD present. No tracheal deviation present. No thyromegaly present.  Cardiovascular: Normal rate, regular rhythm, normal heart sounds and intact distal pulses.  Exam reveals no gallop and no friction rub.   No murmur heard. Pulmonary/Chest: Effort normal and breath sounds normal. No stridor. No respiratory distress. She has no wheezes. She has no rales. She exhibits no tenderness.  Abdominal: Soft. Bowel sounds are normal. She exhibits no distension and no mass. There is no tenderness. There is no rebound and no guarding.  Musculoskeletal: Normal range of motion. She exhibits no edema and no tenderness.  Lymphadenopathy:    She has no cervical adenopathy.  Neurological: She is oriented to person, place, and time.  Skin: Skin is warm and dry. No rash noted. She is not diaphoretic. No erythema. No pallor.  Psychiatric: She has a normal mood and affect. Her behavior is normal. Judgment and thought content normal.  Lab Results  Component Value Date   WBC 5.7  10/27/2013   HGB 12.0 10/27/2013   HCT 36 10/27/2013   PLT 176 10/27/2013   CHOL 107 10/27/2013   TRIG 179* 10/27/2013   HDL 41 10/27/2013   LDLCALC 30 10/27/2013   ALT 21 10/27/2013   AST 18 10/27/2013   NA 138 10/27/2013   K 3.9 10/27/2013   CREATININE 0.8 10/27/2013   BUN 12 10/27/2013   HGBA1C 8.1* 10/27/2013       Assessment & Plan:

## 2013-10-31 NOTE — Assessment & Plan Note (Signed)
I have asked her to switch to invokana for control of the blood sugar

## 2013-10-31 NOTE — Assessment & Plan Note (Signed)
She has achieved her LDL goal 

## 2013-10-31 NOTE — Progress Notes (Signed)
Pre visit review using our clinic review tool, if applicable. No additional management support is needed unless otherwise documented below in the visit note. 

## 2013-10-31 NOTE — Assessment & Plan Note (Signed)
She will work on her diet and exercise to lose weight

## 2013-11-03 ENCOUNTER — Telehealth: Payer: Self-pay | Admitting: Internal Medicine

## 2013-11-03 NOTE — Telephone Encounter (Signed)
Relevant patient education assigned to patient using Emmi. ° °

## 2013-11-04 ENCOUNTER — Telehealth: Payer: Self-pay

## 2013-11-04 NOTE — Telephone Encounter (Signed)
Relevant patient education assigned to patient using Emmi. ° °

## 2013-12-12 ENCOUNTER — Encounter: Payer: Self-pay | Admitting: Internal Medicine

## 2013-12-22 ENCOUNTER — Telehealth: Payer: Self-pay | Admitting: *Deleted

## 2013-12-22 NOTE — Telephone Encounter (Signed)
Pt called requesting refill on Losartin, however medication is no longer on current medication list.  On historical list there is a note stating change in therapy.  Please advise

## 2013-12-22 NOTE — Telephone Encounter (Signed)
Left detailed message on pts VM. 

## 2013-12-22 NOTE — Telephone Encounter (Signed)
It appears that this was changed to lisinopril

## 2014-01-07 ENCOUNTER — Other Ambulatory Visit: Payer: Self-pay | Admitting: *Deleted

## 2014-01-07 DIAGNOSIS — E8940 Asymptomatic postprocedural ovarian failure: Secondary | ICD-10-CM

## 2014-01-07 MED ORDER — ESTRADIOL 0.06 MG/24HR TD PTWK: 1.0000 | MEDICATED_PATCH | TRANSDERMAL | Status: DC

## 2014-01-19 ENCOUNTER — Other Ambulatory Visit: Payer: Self-pay | Admitting: *Deleted

## 2014-01-19 DIAGNOSIS — I10 Essential (primary) hypertension: Secondary | ICD-10-CM

## 2014-01-19 DIAGNOSIS — E1165 Type 2 diabetes mellitus with hyperglycemia: Principal | ICD-10-CM

## 2014-01-19 DIAGNOSIS — E8940 Asymptomatic postprocedural ovarian failure: Secondary | ICD-10-CM

## 2014-01-19 DIAGNOSIS — IMO0001 Reserved for inherently not codable concepts without codable children: Secondary | ICD-10-CM

## 2014-01-19 MED ORDER — CHOLESTYRAMINE 4 GM/DOSE PO POWD: 4.0000 g | Freq: Every day | ORAL | Status: DC

## 2014-01-19 MED ORDER — LISINOPRIL 10 MG PO TABS: 10.0000 mg | ORAL_TABLET | Freq: Every day | ORAL | Status: DC

## 2014-01-19 MED ORDER — SIMVASTATIN 20 MG PO TABS: 20.0000 mg | ORAL_TABLET | Freq: Every evening | ORAL | Status: DC

## 2014-01-29 LAB — HM DIABETES EYE EXAM

## 2014-02-16 ENCOUNTER — Telehealth: Payer: Self-pay | Admitting: Internal Medicine

## 2014-02-16 NOTE — Telephone Encounter (Signed)
Rx refill for xanax was denied. Pt needs a follow up appt.

## 2014-02-16 NOTE — Telephone Encounter (Signed)
rx refill for Alprazolam .5mg . OV scheduled for 02/25/14. Is this okay to refill?

## 2014-02-17 ENCOUNTER — Telehealth: Payer: Self-pay | Admitting: Internal Medicine

## 2014-02-17 NOTE — Telephone Encounter (Signed)
Pt is very upset that her xanax was denied.  She has an appt on May 20.  She want to know if it can be refilled before the appt.

## 2014-02-18 NOTE — Telephone Encounter (Signed)
rx called in and pt informed.

## 2014-02-18 NOTE — Telephone Encounter (Signed)
yes

## 2014-02-25 ENCOUNTER — Other Ambulatory Visit (INDEPENDENT_AMBULATORY_CARE_PROVIDER_SITE_OTHER): Payer: Managed Care, Other (non HMO)

## 2014-02-25 ENCOUNTER — Ambulatory Visit (INDEPENDENT_AMBULATORY_CARE_PROVIDER_SITE_OTHER): Payer: Managed Care, Other (non HMO) | Admitting: Internal Medicine

## 2014-02-25 ENCOUNTER — Encounter: Payer: Self-pay | Admitting: Internal Medicine

## 2014-02-25 ENCOUNTER — Telehealth: Payer: Self-pay | Admitting: Internal Medicine

## 2014-02-25 VITALS — BP 124/70 | HR 74 | Temp 97.8°F | Resp 16 | Ht 62.0 in | Wt 176.0 lb

## 2014-02-25 DIAGNOSIS — Z0000 Encounter for general adult medical examination without abnormal findings: Secondary | ICD-10-CM

## 2014-02-25 DIAGNOSIS — IMO0001 Reserved for inherently not codable concepts without codable children: Secondary | ICD-10-CM

## 2014-02-25 DIAGNOSIS — F411 Generalized anxiety disorder: Secondary | ICD-10-CM

## 2014-02-25 DIAGNOSIS — I10 Essential (primary) hypertension: Secondary | ICD-10-CM

## 2014-02-25 DIAGNOSIS — E1165 Type 2 diabetes mellitus with hyperglycemia: Principal | ICD-10-CM

## 2014-02-25 DIAGNOSIS — L301 Dyshidrosis [pompholyx]: Secondary | ICD-10-CM | POA: Insufficient documentation

## 2014-02-25 LAB — BASIC METABOLIC PANEL
BUN: 21 mg/dL (ref 6–23)
CHLORIDE: 103 meq/L (ref 96–112)
CO2: 27 meq/L (ref 19–32)
CREATININE: 1 mg/dL (ref 0.4–1.2)
Calcium: 9.1 mg/dL (ref 8.4–10.5)
GFR: 58.22 mL/min — ABNORMAL LOW (ref >60.00)
GLUCOSE: 132 mg/dL — AB (ref 70–99)
Potassium: 4.1 mEq/L (ref 3.5–5.1)
Sodium: 138 mEq/L (ref 135–145)

## 2014-02-25 LAB — HEMOGLOBIN A1C: HEMOGLOBIN A1C: 7.8 % — AB (ref 4.6–6.5)

## 2014-02-25 MED ORDER — TRIAMCINOLONE ACETONIDE 0.5 % EX CREA: 1.0000 "application " | TOPICAL_CREAM | Freq: Three times a day (TID) | CUTANEOUS | Status: DC

## 2014-02-25 MED ORDER — ALPRAZOLAM 0.5 MG PO TABS: 0.5000 mg | ORAL_TABLET | Freq: Every evening | ORAL | Status: DC | PRN

## 2014-02-25 MED ORDER — INSULIN DETEMIR 100 UNIT/ML FLEXPEN: 20.0000 [IU] | PEN_INJECTOR | Freq: Every day | SUBCUTANEOUS | Status: DC

## 2014-02-25 NOTE — Progress Notes (Signed)
Subjective:    Patient ID: Sydney Avila, female    DOB: 12/20/1945, 68 y.o.   MRN: [id number]  Diabetes She presents for her follow-up diabetic visit. She has type 2 diabetes mellitus. Her disease course has been stable. Hypoglycemia symptoms include nervousness/anxiousness. Pertinent negatives for hypoglycemia include no confusion, dizziness, headaches or pallor. Associated symptoms include polydipsia, polyphagia and polyuria. Pertinent negatives for diabetes include no blurred vision, no chest pain, no fatigue, no foot paresthesias, no foot ulcerations, no visual change, no weakness and no weight loss. There are no hypoglycemic complications. Symptoms are stable. There are no diabetic complications. Current diabetic treatment includes oral agent (dual therapy). She is compliant with treatment all of the time. Her weight is decreasing steadily. She is following a generally healthy diet. Meal planning includes avoidance of concentrated sweets. She participates in exercise intermittently. There is no change in her home blood glucose trend. An ACE inhibitor/angiotensin II receptor blocker is being taken. She does not see a podiatrist.Eye exam is current.      Review of Systems  Constitutional: Negative.  Negative for fever, chills, weight loss, diaphoresis, appetite change and fatigue.  HENT: Negative.   Eyes: Negative.  Negative for blurred vision.  Respiratory: Negative.   Cardiovascular: Negative.  Negative for chest pain, palpitations and leg swelling.  Gastrointestinal: Negative.  Negative for nausea, vomiting, abdominal pain, diarrhea and constipation.  Endocrine: Positive for polydipsia, polyphagia and polyuria.  Genitourinary: Negative.  Negative for dysuria, frequency, vaginal discharge, enuresis, difficulty urinating and vaginal pain.  Musculoskeletal: Negative.  Negative for arthralgias, back pain, myalgias and neck stiffness.  Skin: Positive for rash. Negative for color change,  pallor and wound.       She has a recurrent itchy rash on her right calf.  Allergic/Immunologic: Negative.   Neurological: Negative.  Negative for dizziness, weakness and headaches.  Hematological: Negative.  Negative for adenopathy. Does not bruise/bleed easily.  Psychiatric/Behavioral: Positive for sleep disturbance. Negative for suicidal ideas, hallucinations, behavioral problems, confusion, self-injury, dysphoric mood, decreased concentration and agitation. The patient is nervous/anxious. The patient is not hyperactive.        Objective:   Physical Exam  Vitals reviewed. Constitutional: She is oriented to person, place, and time. She appears well-developed and well-nourished. No distress.  HENT:  Head: Normocephalic and atraumatic.  Mouth/Throat: Oropharynx is clear and moist. No oropharyngeal exudate.  Eyes: Conjunctivae are normal. Right eye exhibits no discharge. Left eye exhibits no discharge. No scleral icterus.  Neck: Normal range of motion. Neck supple. No JVD present. No tracheal deviation present. No thyromegaly present.  Cardiovascular: Normal rate, regular rhythm, normal heart sounds and intact distal pulses.  Exam reveals no gallop and no friction rub.   No murmur heard. Pulmonary/Chest: Effort normal and breath sounds normal. No stridor. No respiratory distress. She has no wheezes. She has no rales. She exhibits no tenderness.  Abdominal: Soft. Bowel sounds are normal. She exhibits no distension and no mass. There is no tenderness. There is no rebound and no guarding.  Musculoskeletal: Normal range of motion. She exhibits no edema and no tenderness.  Lymphadenopathy:    She has no cervical adenopathy.  Neurological: She is oriented to person, place, and time.  Skin: Skin is warm, dry and intact. Rash noted. No abrasion, no bruising, no burn, no ecchymosis, no laceration, no lesion, no petechiae and no purpura noted. Rash is papular. Rash is not macular, not maculopapular,  not nodular, not pustular, not vesicular and not urticarial.  She is not diaphoretic. No erythema. No pallor.     Psychiatric: She has a normal mood and affect. Her behavior is normal. Judgment and thought content normal.     Lab Results  Component Value Date   WBC 5.7 10/27/2013   HGB 12.0 10/27/2013   HCT 36 10/27/2013   PLT 176 10/27/2013   CHOL 107 10/27/2013   TRIG 179* 10/27/2013   HDL 41 10/27/2013   LDLCALC 30 10/27/2013   ALT 21 10/27/2013   AST 18 10/27/2013   NA 138 10/27/2013   K 3.9 10/27/2013   CREATININE 0.8 10/27/2013   BUN 12 10/27/2013   HGBA1C 8.1* 10/27/2013       Assessment & Plan:

## 2014-02-25 NOTE — Patient Instructions (Signed)
Type 2 Diabetes Mellitus, Adult Type 2 diabetes mellitus, often simply referred to as type 2 diabetes, is a long-lasting (chronic) disease. In type 2 diabetes, the pancreas does not make enough insulin (a hormone), the cells are less responsive to the insulin that is made (insulin resistance), or both. Normally, insulin moves sugars from food into the tissue cells. The tissue cells use the sugars for energy. The lack of insulin or the lack of normal response to insulin causes excess sugars to build up in the blood instead of going into the tissue cells. As a result, high blood sugar (hyperglycemia) develops. The effect of high sugar (glucose) levels can cause many complications. Type 2 diabetes was also previously called adult-onset diabetes but it can occur at any age.  RISK FACTORS  A person is predisposed to developing type 2 diabetes if someone in the family has the disease and also has one or more of the following primary risk factors:  Overweight.  An inactive lifestyle.  A history of consistently eating high-calorie foods. Maintaining a normal weight and regular physical activity can reduce the chance of developing type 2 diabetes. SYMPTOMS  A person with type 2 diabetes may not show symptoms initially. The symptoms of type 2 diabetes appear slowly. The symptoms include:  Increased thirst (polydipsia).  Increased urination (polyuria).  Increased urination during the night (nocturia).  Weight loss. This weight loss may be rapid.  Frequent, recurring infections.  Tiredness (fatigue).  Weakness.  Vision changes, such as blurred vision.  Fruity smell to your breath.  Abdominal pain.  Nausea or vomiting.  Cuts or bruises which are slow to heal.  Tingling or numbness in the hands or feet. DIAGNOSIS Type 2 diabetes is frequently not diagnosed until complications of diabetes are present. Type 2 diabetes is diagnosed when symptoms or complications are present and when blood  glucose levels are increased. Your blood glucose level may be checked by one or more of the following blood tests:  A fasting blood glucose test. You will not be allowed to eat for at least 8 hours before a blood sample is taken.  A random blood glucose test. Your blood glucose is checked at any time of the day regardless of when you ate.  A hemoglobin A1c blood glucose test. A hemoglobin A1c test provides information about blood glucose control over the previous 3 months.  An oral glucose tolerance test (OGTT). Your blood glucose is measured after you have not eaten (fasted) for 2 hours and then after you drink a glucose-containing beverage. TREATMENT   You may need to take insulin or diabetes medicine daily to keep blood glucose levels in the desired range.  You will need to match insulin dosing with exercise and healthy food choices. The treatment goal is to maintain the before meal blood sugar (preprandial glucose) level at 70 130 mg/dL. HOME CARE INSTRUCTIONS   Have your hemoglobin A1c level checked twice a year.  Perform daily blood glucose monitoring as directed by your caregiver.  Monitor urine ketones when you are ill and as directed by your caregiver.  Take your diabetes medicine or insulin as directed by your caregiver to maintain your blood glucose levels in the desired range.  Never run out of diabetes medicine or insulin. It is needed every day.  Adjust insulin based on your intake of carbohydrates. Carbohydrates can raise blood glucose levels but need to be included in your diet. Carbohydrates provide vitamins, minerals, and fiber which are an essential part of   a healthy diet. Carbohydrates are found in fruits, vegetables, whole grains, dairy products, legumes, and foods containing added sugars.    Eat healthy foods. Alternate 3 meals with 3 snacks.  Lose weight if overweight.  Carry a medical alert card or wear your medical alert jewelry.  Carry a 15 gram  carbohydrate snack with you at all times to treat low blood glucose (hypoglycemia). Some examples of 15 gram carbohydrate snacks include:  Glucose tablets, 3 or 4   Glucose gel, 15 gram tube  Raisins, 2 tablespoons (24 grams)  Jelly beans, 6  Animal crackers, 8  Regular pop, 4 ounces (120 mL)  Gummy treats, 9  Recognize hypoglycemia. Hypoglycemia occurs with blood glucose levels of 70 mg/dL and below. The risk for hypoglycemia increases when fasting or skipping meals, during or after intense exercise, and during sleep. Hypoglycemia symptoms can include:  Tremors or shakes.  Decreased ability to concentrate.  Sweating.  Increased heart rate.  Headache.  Dry mouth.  Hunger.  Irritability.  Anxiety.  Restless sleep.  Altered speech or coordination.  Confusion.  Treat hypoglycemia promptly. If you are alert and able to safely swallow, follow the 15:15 rule:  Take 15 20 grams of rapid-acting glucose or carbohydrate. Rapid-acting options include glucose gel, glucose tablets, or 4 ounces (120 mL) of fruit juice, regular soda, or low fat milk.  Check your blood glucose level 15 minutes after taking the glucose.  Take 15 20 grams more of glucose if the repeat blood glucose level is still 70 mg/dL or below.  Eat a meal or snack within 1 hour once blood glucose levels return to normal.    Be alert to polyuria and polydipsia which are early signs of hyperglycemia. An early awareness of hyperglycemia allows for prompt treatment. Treat hyperglycemia as directed by your caregiver.  Engage in at least 150 minutes of moderate-intensity physical activity a week, spread over at least 3 days of the week or as directed by your caregiver. In addition, you should engage in resistance exercise at least 2 times a week or as directed by your caregiver.  Adjust your medicine and food intake as needed if you start a new exercise or sport.  Follow your sick day plan at any time you  are unable to eat or drink as usual.  Avoid tobacco use.  Limit alcohol intake to no more than 1 drink per day for nonpregnant women and 2 drinks per day for men. You should drink alcohol only when you are also eating food. Talk with your caregiver whether alcohol is safe for you. Tell your caregiver if you drink alcohol several times a week.  Follow up with your caregiver regularly.  Schedule an eye exam soon after the diagnosis of type 2 diabetes and then annually.  Perform daily skin and foot care. Examine your skin and feet daily for cuts, bruises, redness, nail problems, bleeding, blisters, or sores. A foot exam by a caregiver should be done annually.  Brush your teeth and gums at least twice a day and floss at least once a day. Follow up with your dentist regularly.  Share your diabetes management plan with your workplace or school.  Stay up-to-date with immunizations.  Learn to manage stress.  Obtain ongoing diabetes education and support as needed.  Participate in, or seek rehabilitation as needed to maintain or improve independence and quality of life. Request a physical or occupational therapy referral if you are having foot or hand numbness or difficulties with grooming,   dressing, eating, or physical activity. SEEK MEDICAL CARE IF:   You are unable to eat food or drink fluids for more than 6 hours.  You have nausea and vomiting for more than 6 hours.  Your blood glucose level is over 240 mg/dL.  There is a change in mental status.  You develop an additional serious illness.  You have diarrhea for more than 6 hours.  You have been sick or have had a fever for a couple of days and are not getting better.  You have pain during any physical activity.  SEEK IMMEDIATE MEDICAL CARE IF:  You have difficulty breathing.  You have moderate to large ketone levels. MAKE SURE YOU:  Understand these instructions.  Will watch your condition.  Will get help right away if  you are not doing well or get worse. Document Released: 09/25/2005 Document Revised: 06/19/2012 Document Reviewed: 04/23/2012 [employer] Patient Information 2014 [employer].  

## 2014-02-25 NOTE — Progress Notes (Signed)
Pre visit review using our clinic review tool, if applicable. No additional management support is needed unless otherwise documented below in the visit note. 

## 2014-02-25 NOTE — Telephone Encounter (Signed)
Please tell her that the blood sugars have not improved much, I think she should start an insulin injection and stay on the current meds I have sent the insulin Rx to her pharmacy but she may want to come in for [place] to show her how to give herself the insulin I have also sent a referral to diabetic education to help manage the insulin and blood sugar management

## 2014-02-26 ENCOUNTER — Telehealth: Payer: Self-pay | Admitting: *Deleted

## 2014-02-26 NOTE — Assessment & Plan Note (Signed)
Will treat with TAC cream 

## 2014-02-26 NOTE — Assessment & Plan Note (Signed)
Her A1C is still a little too high and she she complains of poly's so I think she should start a basal insulin injection She is not comfortable with this and requests a referral to ENDO

## 2014-02-26 NOTE — Assessment & Plan Note (Signed)
She will cont xanax as needed 

## 2014-02-26 NOTE — Assessment & Plan Note (Signed)
Her BP is well controlled 

## 2014-02-26 NOTE — Telephone Encounter (Signed)
Spoke with pt advised of MDs message.  Pt would like to be scheduled with an Endocrinologist for a second opinion.  Pt does not feel comfortable with insulin.  Please advise

## 2014-02-26 NOTE — Telephone Encounter (Signed)
Called patient lmovm advising per MD and to schedule appointment if needed.

## 2014-02-27 ENCOUNTER — Ambulatory Visit: Payer: Managed Care, Other (non HMO) | Admitting: Internal Medicine

## 2014-03-05 NOTE — Telephone Encounter (Signed)
I ordered a referral one week ago

## 2014-03-05 NOTE — Telephone Encounter (Signed)
Referral complete.

## 2014-03-09 ENCOUNTER — Encounter: Payer: Self-pay | Admitting: Internal Medicine

## 2014-03-10 ENCOUNTER — Encounter: Payer: Self-pay | Admitting: Internal Medicine

## 2014-03-10 ENCOUNTER — Ambulatory Visit (INDEPENDENT_AMBULATORY_CARE_PROVIDER_SITE_OTHER): Payer: Managed Care, Other (non HMO) | Admitting: Internal Medicine

## 2014-03-10 VITALS — BP 122/74 | HR 80 | Temp 98.2°F | Resp 12 | Wt 171.0 lb

## 2014-03-10 DIAGNOSIS — IMO0001 Reserved for inherently not codable concepts without codable children: Secondary | ICD-10-CM

## 2014-03-10 DIAGNOSIS — E1165 Type 2 diabetes mellitus with hyperglycemia: Principal | ICD-10-CM

## 2014-03-10 MED ORDER — METFORMIN HCL 1000 MG PO TABS: 1000.0000 mg | ORAL_TABLET | Freq: Two times a day (BID) | ORAL | Status: DC

## 2014-03-10 MED ORDER — CANAGLIFLOZIN 300 MG PO TABS: ORAL_TABLET | ORAL | Status: DC

## 2014-03-10 NOTE — Progress Notes (Signed)
Patient ID: Sydney Avila, female   DOB: January 25, 1946, 68 y.o.   MRN: [id number]  HPI: Sydney Avila is a 68 y.o.-year-old female, self-referred, for management of DM2, non-insulin-dependent, uncontrolled, without complications.  Patient has been diagnosed with diabetes in 2000; she did not start insulin as advised by PCP few days ago. Last hemoglobin A1c was: Lab Results  Component Value Date   HGBA1C 7.8* 02/25/2014   HGBA1C 8.1* 10/27/2013   Pt is on a regimen of: - Invokamet 150-500 mg 2x a day started in 10/2013 >> started to lose weight. She had 2 yeast infections. She was on Metformin initially.   Pt checks her sugars once a day and they are -only started to check sugars few days ago - am: 200 > 198 > 198 > 164 (lower every day)  No lows. ; she has hypoglycemia awareness at 68.   Pt's meals are: - Breakfast: yoghurt, fruit - Lunch: salad with tuna - Dinner: meat + vegetables - Snacks: 2x  Exercising by walking/gardening 2x a week.  - no CKD, last BUN/creatinine:  Lab Results  Component Value Date   BUN 21 02/25/2014   CREATININE 1.0 02/25/2014  On Lisinopril. - last set of lipids: Lab Results  Component Value Date   CHOL 107 10/27/2013   HDL 41 10/27/2013   LDLCALC 30 10/27/2013   TRIG 179* 10/27/2013  On Simvastatin. - last eye exam was in 01/2014 (Dr Gershon Crane). No DR. She had cataracts >> removed. She will have palpebral ptosis surgery. - no numbness and tingling in her feet.  Pt has FH of DM in brother, sister, father, GGM, MGF.  ROS: Constitutional: no weight gain/loss, no fatigue, no subjective hyperthermia/hypothermia, + increased urination, + poor sleep Eyes: no blurry vision, no xerophthalmia ENT: no sore throat, no nodules palpated in throat, no dysphagia/odynophagia, no hoarseness Cardiovascular: no CP/SOB/palpitations/leg swelling Respiratory: no cough/SOB Gastrointestinal: no N/V/+ D/+ C Musculoskeletal: + muscle/no joint aches Skin: no rashes, + easy  bruising Neurological: no tremors/numbness/tingling/dizziness Psychiatric: no depression/anxiety  Past Medical History  Diagnosis Date  . Fibroid   . HTN (hypertension)   . Hypercholesterolemia   . Obesity   . Anxiety state, unspecified   . Unspecified menopausal and postmenopausal disorder   . Chest pain, unspecified   . Unspecified hemorrhagic conditions   . Type II or unspecified type diabetes mellitus without mention of complication, not stated as uncontrolled    Past Surgical History  Procedure Laterality Date  . Oophorectomy      BSO  . Abdominal hysterectomy  1983    TAH BSO  . Cholecystectomy    . Mouth surgery    . Ovarian cyst surgery    . Foot surgery    . Cataract extraction, bilateral    . Salpingoophorectomy     History   Social History  . Marital Status: Divorced    Spouse Name: N/A    Number of Children: 1   Occupational History  . Mediator - [employer] History Main Topics  . Smoking status: Former Research scientist (life sciences)  . Smokeless tobacco: Never Used  . Alcohol Use: No     Comment: Rare - wine - social  . Drug Use: No   Current Outpatient Prescriptions on File Prior to Visit  Medication Sig Dispense Refill  . ALPRAZolam (XANAX) 0.5 MG tablet Take 1 tablet (0.5 mg total) by mouth at bedtime as needed for anxiety.  30 tablet  3  . cholestyramine Lucrezia Starch)  4 GM/DOSE powder Take 1 packet (4 g total) by mouth daily.  378 g  12  . estradiol (CLIMARA) 0.06 MG/24HR Place 1 patch onto the skin once a week.  12 patch  3  . lisinopril (PRINIVIL,ZESTRIL) 10 MG tablet Take 1 tablet (10 mg total) by mouth daily.  90 tablet  3  . simvastatin (ZOCOR) 20 MG tablet Take 1 tablet (20 mg total) by mouth every evening.  30 tablet  11  . triamcinolone cream (KENALOG) 0.5 % Apply 1 application topically 3 (three) times daily.  30 g  2  . Insulin Detemir (LEVEMIR FLEXTOUCH) 100 UNIT/ML Pen Inject 20 Units into the skin daily at 10 pm.  15 mL  11   No current  facility-administered medications on file prior to visit.   Allergies  Allergen Reactions  . Amoxicillin     Upset GI per pt account  . Sulfa Antibiotics    Family History  Problem Relation Age of Onset  . Uterine cancer Mother   . Diabetes Father   . Heart disease Father   . Diabetes Maternal Grandmother   . Diabetes Maternal Grandfather   . Early death Neg Hx   . Kidney disease Neg Hx   . Hyperlipidemia Neg Hx   . Stroke Neg Hx    PE: BP 122/74  Pulse 80  Temp(Src) 98.2 F (36.8 C) (Oral)  Resp 12  Wt 171 lb (77.565 kg)  SpO2 95% Wt Readings from Last 3 Encounters:  03/10/14 171 lb (77.565 kg)  02/25/14 176 lb (79.833 kg)  10/31/13 182 lb (82.555 kg)   Constitutional: overweight, in NAD Eyes: PERRLA, EOMI, no exophthalmos ENT: moist mucous membranes, no thyromegaly, no cervical lymphadenopathy Cardiovascular: RRR, No MRG Respiratory: CTA B Gastrointestinal: abdomen soft, NT, ND, BS+ Musculoskeletal: no deformities, strength intact in all 4 Skin: moist, warm, no rashes Neurological: no tremor with outstretched hands, DTR normal in all 4  ASSESSMENT: 1. DM2, non-insulin-dependent, uncontrolled, without complications  PLAN:  1. Patient with long-standing, recently more uncontrolled diabetes, on oral antidiabetic regimen, which became insufficient - We discussed about options for treatment, and I suggested to increase Metformin and take all Invokana in am:  Patient Instructions  Please increase the Invokamet to 2 tablets in am and add a Metformin tablet of 1000 mg with dinner.  When you finish Invokamet, start: -  Metformin 1000 mg 2x a day with meal - Invokana 300 mg daily before breakfast Please return in 1 month with your sugar log.  - given discount card for Invokana - she would like to continue with Invokana despite the yeast inf - Strongly advised her to start checking sugars at different times of the day - check 2 times a day, rotating checks - given  sugar log and advised how to fill it and to bring it at next appt  - given foot care handout and explained the principles  - given instructions for hypoglycemia management "15-15 rule"  - advised for yearly eye exams >> she is up to date - Return to clinic in 1 mo with sugar log

## 2014-03-10 NOTE — Patient Instructions (Signed)
Please increase the Invokamet to 2 tablets in am and add a Metformin tablet of 1000 mg with dinner.  When you finish Invokamet, start: -  Metformin 1000 mg 2x a day with meal - Invokana 300 mg daily before breakfast  Please return in 1 month with your sugar log.   PATIENT INSTRUCTIONS FOR TYPE 2 DIABETES:  **Please join MyChart!** - see attached instructions about how to join if you have not done so already.  DIET AND EXERCISE Diet and exercise is an important part of diabetic treatment.  We recommended aerobic exercise in the form of brisk walking (working between 40-60% of maximal aerobic capacity, similar to brisk walking) for 150 minutes per week (such as 30 minutes five days per week) along with 3 times per week performing 'resistance' training (using various gauge rubber tubes with handles) 5-10 exercises involving the major muscle groups (upper body, lower body and core) performing 10-15 repetitions (or near fatigue) each exercise. Start at half the above goal but build slowly to reach the above goals. If limited by weight, joint pain, or disability, we recommend daily walking in a swimming pool with water up to waist to reduce pressure from joints while allow for adequate exercise.    BLOOD GLUCOSES Monitoring your blood glucoses is important for continued management of your diabetes. Please check your blood glucoses 2-4 times a day: fasting, before meals and at bedtime (you can rotate these measurements - e.g. one day check before the 3 meals, the next day check before 2 of the meals and before bedtime, etc.).   HYPOGLYCEMIA (low blood sugar) Hypoglycemia is usually a reaction to not eating, exercising, or taking too much insulin/ other diabetes drugs.  Symptoms include tremors, sweating, hunger, confusion, headache, etc. Treat IMMEDIATELY with 15 grams of Carbs:   4 glucose tablets    cup regular juice/soda   2 tablespoons raisins   4 teaspoons sugar   1 tablespoon honey Recheck  blood glucose in 15 mins and repeat above if still symptomatic/blood glucose <100.  RECOMMENDATIONS TO REDUCE YOUR RISK OF DIABETIC COMPLICATIONS: * Take your prescribed MEDICATION(S) * Follow a DIABETIC diet: Complex carbs, fiber rich foods, (monounsaturated and polyunsaturated) fats * AVOID saturated/trans fats, high fat foods, >2,300 mg salt per day. * EXERCISE at least 5 times a week for 30 minutes or preferably daily.  * DO NOT SMOKE OR DRINK more than 1 drink a day. * Check your FEET every day. Do not wear tightfitting shoes. Contact us if you develop an ulcer * See your EYE doctor once a year or more if needed * Get a FLU shot once a year * Get a PNEUMONIA vaccine once before and once after age 16 years  GOALS:  * Your Hemoglobin A1c of <7%  * fasting sugars need to be <130 * after meals sugars need to be <180 (2h after you start eating) * Your Systolic BP should be 601 or lower  * Your Diastolic BP should be 80 or lower  * Your HDL (Good Cholesterol) should be 40 or higher  * Your LDL (Bad Cholesterol) should be 100 or lower. * Your Triglycerides should be 150 or lower  * Your Urine microalbumin (kidney function) should be <30 * Your Body Mass Index should be 25 or lower   We will be glad to help you achieve these goals. Our telephone number is: [phone].

## 2014-03-25 ENCOUNTER — Other Ambulatory Visit: Payer: Self-pay | Admitting: *Deleted

## 2014-03-25 MED ORDER — GLUCOSE BLOOD VI STRP: ORAL_STRIP | Status: DC

## 2014-04-06 ENCOUNTER — Ambulatory Visit (AMBULATORY_SURGERY_CENTER): Payer: Self-pay

## 2014-04-06 VITALS — Ht 62.0 in | Wt 167.0 lb

## 2014-04-06 DIAGNOSIS — Z1211 Encounter for screening for malignant neoplasm of colon: Secondary | ICD-10-CM

## 2014-04-06 MED ORDER — MOVIPREP 100 G PO SOLR: 1.0000 | Freq: Once | ORAL | Status: DC

## 2014-04-06 NOTE — Progress Notes (Signed)
No allergies to eggs or soy No home oxygen No diet/weight loss meds No past problems with anesthesia  Has email  Emmi instructions given for colonoscopy 

## 2014-04-09 ENCOUNTER — Encounter: Payer: Self-pay | Admitting: Internal Medicine

## 2014-04-16 ENCOUNTER — Encounter: Payer: Self-pay | Admitting: Internal Medicine

## 2014-04-16 ENCOUNTER — Ambulatory Visit (AMBULATORY_SURGERY_CENTER): Payer: Managed Care, Other (non HMO) | Admitting: Internal Medicine

## 2014-04-16 VITALS — BP 139/66 | HR 67 | Temp 97.3°F | Resp 20 | Ht 62.0 in | Wt 167.0 lb

## 2014-04-16 DIAGNOSIS — Z1211 Encounter for screening for malignant neoplasm of colon: Secondary | ICD-10-CM

## 2014-04-16 HISTORY — PX: COLONOSCOPY: SHX174

## 2014-04-16 MED ORDER — SODIUM CHLORIDE 0.9 % IV SOLN: 500.0000 mL | INTRAVENOUS | Status: DC

## 2014-04-16 NOTE — Op Note (Signed)
[facility]  [address] [place], [id number]   COLONOSCOPY PROCEDURE REPORT  PATIENT: Sydney Avila, Sydney Avila  MR#: [id number] BIRTHDATE: November 11, 1945 , 68  yrs. old GENDER: Female ENDOSCOPIST: Jerene Bears, MD REFERRED IB:[id number] Evalina Field, M.D. PROCEDURE DATE:  04/16/2014 PROCEDURE:   Colonoscopy, screening First Screening Colonoscopy - Avg.  risk and is 50 yrs.  old or older Yes.  Prior Negative Screening - Now for repeat screening. N/A  History of Adenoma - Now for follow-up colonoscopy & has been > or = to 3 yrs.  N/A  Polyps Removed Today? Yes. ASA CLASS:   Class III INDICATIONS:average risk screening and first colonoscopy. MEDICATIONS: MAC sedation, administered by CRNA and propofol (Diprivan) 160mg  IV  DESCRIPTION OF PROCEDURE:   After the risks benefits and alternatives of the procedure were thoroughly explained, informed consent was obtained.  A digital rectal exam revealed several skin tags.   The LB [id number]  endoscope was introduced through the anus and advanced to the cecum, which was identified by both the appendix and ileocecal valve. No adverse events experienced. The quality of the prep was good, using MoviPrep  The instrument was then slowly withdrawn as the colon was fully examined.   COLON FINDINGS: Moderate diverticulosis was noted in the ascending colon, descending colon, and sigmoid colon.   The colon mucosa was otherwise normal.  Retroflexed views revealed internal/external hemorrhoids. The time to cecum=4 minutes 34 seconds.  Withdrawal time=10 minutes 35 seconds.  The scope was withdrawn and the procedure completed.  COMPLICATIONS: There were no complications.  ENDOSCOPIC IMPRESSION: 1.   Moderate diverticulosis was noted in the ascending colon, descending colon, and sigmoid colon 2.   The colon mucosa was otherwise normal  RECOMMENDATIONS: 1.  High fiber diet 2.  You should continue to follow colorectal cancer  screening guidelines for "routine risk" patients with a repeat colonoscopy in 10 years.  There is no need for FOBT (stool) testing for at least 5 years.   eSigned:  Jerene Bears, MD 04/16/2014 8:31 AM   cc: The Patient and Janith Lima, MD

## 2014-04-16 NOTE — Patient Instructions (Signed)
YOU HAD AN ENDOSCOPIC PROCEDURE TODAY AT [facility]: Refer to the procedure report that was given to you for any specific questions about what was found during the examination.  If the procedure report does not answer your questions, please call your gastroenterologist to clarify.  If you requested that your care partner not be given the details of your procedure findings, then the procedure report has been included in a sealed envelope for you to review at your convenience later.  YOU SHOULD EXPECT: Some feelings of bloating in the abdomen. Passage of more gas than usual.  Walking can help get rid of the air that was put into your GI tract during the procedure and reduce the bloating. If you had a lower endoscopy (such as a colonoscopy or flexible sigmoidoscopy) you may notice spotting of blood in your stool or on the toilet paper. If you underwent a bowel prep for your procedure, then you may not have a normal bowel movement for a few days.  DIET: Your first meal following the procedure should be a light meal and then it is ok to progress to your normal diet.  A half-sandwich or bowl of soup is an example of a good first meal.  Heavy or fried foods are harder to digest and may make you feel nauseous or bloated.  Likewise meals heavy in dairy and vegetables can cause extra gas to form and this can also increase the bloating.  Drink plenty of fluids but you should avoid alcoholic beverages for 24 hours.  Try to increase the fiber in your diet.  ACTIVITY: Your care partner should take you home directly after the procedure.  You should plan to take it easy, moving slowly for the rest of the day.  You can resume normal activity the day after the procedure however you should NOT DRIVE or use heavy machinery for 24 hours (because of the sedation medicines used during the test).    SYMPTOMS TO REPORT IMMEDIATELY: A gastroenterologist can be reached at any hour.  During normal business hours,  8:30 AM to 5:00 PM Monday through Friday, call [phone].  After hours and on weekends, please call the GI answering service at [phone] who will take a message and have the physician on call contact you.   Following lower endoscopy (colonoscopy or flexible sigmoidoscopy):  Excessive amounts of blood in the stool  Significant tenderness or worsening of abdominal pains  Swelling of the abdomen that is new, acute  Fever of 100F or higher  FOLLOW UP: If any biopsies were taken you will be contacted by phone or by letter within the next 1-3 weeks.  Call your gastroenterologist if you have not heard about the biopsies in 3 weeks.  Our staff will call the home number listed on your records the next business day following your procedure to check on you and address any questions or concerns that you may have at that time regarding the information given to you following your procedure. This is a courtesy call and so if there is no answer at the home number and we have not heard from you through the emergency physician on call, we will assume that you have returned to your regular daily activities without incident.  SIGNATURES/CONFIDENTIALITY: You and/or your care partner have signed paperwork which will be entered into your electronic medical record.  These signatures attest to the fact that that the information above on your After Visit Summary has been reviewed and is  Full responsibility of the confidentiality of this discharge information lies with you and/or your care-partner.  Please, read the handouts given to you by your recovery room nurse. 

## 2014-04-16 NOTE — Progress Notes (Signed)
A/ox3 pleased with MAC, report to Suzanne RN 

## 2014-04-17 ENCOUNTER — Telehealth: Payer: Self-pay

## 2014-04-17 NOTE — Telephone Encounter (Signed)
  Follow up Call-  Call back number 04/16/2014  Post procedure Call Back phone  # [phone]  Permission to leave phone message Yes     Patient questions:  Do you have a fever, pain , or abdominal swelling? No. Pain Score  0 *  Have you tolerated food without any problems? Yes.    Have you been able to return to your normal activities? Yes.    Do you have any questions about your discharge instructions: Diet   No. Medications  No. Follow up visit  No.  Do you have questions or concerns about your Care? No.  Actions: * If pain score is 4 or above: No action needed, pain <4.

## 2014-04-21 ENCOUNTER — Ambulatory Visit (INDEPENDENT_AMBULATORY_CARE_PROVIDER_SITE_OTHER): Payer: Managed Care, Other (non HMO) | Admitting: Internal Medicine

## 2014-04-21 ENCOUNTER — Encounter: Payer: Self-pay | Admitting: Internal Medicine

## 2014-04-21 VITALS — BP 116/72 | HR 88 | Temp 98.0°F | Resp 12 | Wt 166.0 lb

## 2014-04-21 DIAGNOSIS — E1165 Type 2 diabetes mellitus with hyperglycemia: Principal | ICD-10-CM

## 2014-04-21 DIAGNOSIS — IMO0001 Reserved for inherently not codable concepts without codable children: Secondary | ICD-10-CM

## 2014-04-21 LAB — HM COLONOSCOPY: HM Colonoscopy: NORMAL

## 2014-04-21 MED ORDER — METFORMIN HCL ER 500 MG PO TB24: 1000.0000 mg | ORAL_TABLET | Freq: Two times a day (BID) | ORAL | Status: DC

## 2014-04-21 MED ORDER — GLUCOSE BLOOD VI STRP: ORAL_STRIP | Status: DC

## 2014-04-21 NOTE — Progress Notes (Signed)
Patient ID: Sydney Avila, female   DOB: 1946-03-01, 68 y.o.   MRN: [id number]  HPI: Sydney Avila is a 68 y.o.-year-old female, returning for f/u for DM2, dx 2000, non-insulin-dependent, uncontrolled, without complications. Last visit 1.5 mo ago.  Last hemoglobin A1c was: Lab Results  Component Value Date   HGBA1C 7.8* 02/25/2014   HGBA1C 8.1* 10/27/2013   Pt is on a regimen of: - Invokamet 150-500 mg x2 taken 2x a day! She did not understand to take 2 tabs in am and start Metformin 1000 mg at night as advised at last visit! >> she is on 600 mg Invokana a day! She had 2 yeast infections, not recently  Pt checks her sugars 3-4x a day and they are still higher in am and 2h after dinner, due to snacking: - am: 200 > 198 > 198 > 164 (lower every day) >> 127-167 (184 x 1) - 2h after b'fast: 129-158 - lunch: 98-123 - 2h after lunch: 99-155 - dinner: 102-154 - 2h after dinner: 131-178 (208, 213) No lows; she has hypoglycemia awareness at 68.   Pt's meals are: - Breakfast: yoghurt, fruit - Lunch: salad with tuna - Dinner: meat + vegetables - Snacks: 2x  Exercising by walking/gardening 2x a week.  - no CKD, last BUN/creatinine:  Lab Results  Component Value Date   BUN 21 02/25/2014   CREATININE 1.0 02/25/2014  On Lisinopril. - last set of lipids: Lab Results  Component Value Date   CHOL 107 10/27/2013   HDL 41 10/27/2013   LDLCALC 30 10/27/2013   TRIG 179* 10/27/2013  On Simvastatin. - last eye exam was in 01/2014 (Dr Gershon Crane). No DR. She had cataracts >> removed.  - no numbness and tingling in her feet.  I reviewed pt's medications, allergies, PMH, social hx, family hx and no changes required, except as mentioned above.  ROS: Constitutional: +  weight gain/loss, no fatigue, no subjective hyperthermia/hypothermia, no excessive urination Eyes: no blurry vision, no xerophthalmia ENT: no sore throat, no nodules palpated in throat, no dysphagia/odynophagia, no  hoarseness Cardiovascular: no CP/SOB/palpitations/leg swelling Respiratory: no cough/SOB Gastrointestinal: no N/V/D/C Musculoskeletal: no muscle/no joint aches Skin: + rash - L side of nose Neurological: no tremors/numbness/tingling/dizziness  PE: BP 116/72  Pulse 88  Temp(Src) 98 F (36.7 C) (Oral)  Resp 12  Wt 166 lb (75.297 kg)  SpO2 97% Wt Readings from Last 3 Encounters:  04/21/14 166 lb (75.297 kg)  04/16/14 167 lb (75.751 kg)  04/06/14 167 lb (75.751 kg)   Constitutional: overweight, in NAD Eyes: PERRLA, EOMI, no exophthalmos ENT: moist mucous membranes, no thyromegaly, no cervical lymphadenopathy Cardiovascular: RRR, No MRG Respiratory: CTA B Gastrointestinal: abdomen soft, NT, ND, BS+ Musculoskeletal: no deformities, strength intact in all 4 Skin: moist, warm, no rashes Neurological: no tremor with outstretched hands, DTR normal in all 4  ASSESSMENT: 1. DM2, non-insulin-dependent, uncontrolled, without complications  PLAN:  1. Patient with long-standing, uncontrolled diabetes, on oral antidiabetic regimen with 2000 mg Metformin ER and 600 mg Invokana >> she wanted to use all her Invokamet before he switched to the new regimen (aplitting the  - We discussed about options for treatment, and I suggested to increase Metformin and take all Invokana in am:  Patient Instructions  Please take the Invokamet 2 tablets in am and add Metformin ER 1000 mg with dinner.  When you finish Invokamet, start: - Metformin ER 1000 mg 2x a day with meal - Invokana 300 mg daily before breakfast I  have referred you to nutrition classes - you will be called with the appointment. Please return in 2 months with your sugar log.  - she would like to continue with Invokana, but we ned to decrease the dose to half - continue checking sugars at different times of the day - check 2 times a day, rotating checks - referred her to nutrition as she interested in the nutrition classes - suggested  the "Kind" bars - only 8g of carbs to snack at night - she is up to date with yearly eye exams   - will check HbA1c at next visit - Return to clinic in 2 mo with sugar log

## 2014-04-21 NOTE — Patient Instructions (Addendum)
Please take the Invokamet 2 tablets in am and add Metformin ER 1000 mg with dinner.  When you finish Invokamet, start: - Metformin ER 1000 mg 2x a day with meal - Invokana 300 mg daily before breakfast Please return in 1 month with your sugar log.   I have referred you to nutrition classes - you will be called with the appointment.  Please return in 2 months with your sugar log.

## 2014-04-23 ENCOUNTER — Telehealth: Payer: Self-pay | Admitting: *Deleted

## 2014-04-23 MED ORDER — CANAGLIFLOZIN 300 MG PO TABS: ORAL_TABLET | ORAL | Status: DC

## 2014-04-23 NOTE — Telephone Encounter (Signed)
Clarified with pt about medication and ordered Invokana.

## 2014-05-05 ENCOUNTER — Encounter: Payer: Managed Care, Other (non HMO) | Attending: Internal Medicine

## 2014-05-05 VITALS — Ht 62.0 in | Wt 169.6 lb

## 2014-05-05 DIAGNOSIS — Z713 Dietary counseling and surveillance: Secondary | ICD-10-CM | POA: Insufficient documentation

## 2014-05-05 DIAGNOSIS — IMO0001 Reserved for inherently not codable concepts without codable children: Secondary | ICD-10-CM | POA: Diagnosis present

## 2014-05-05 DIAGNOSIS — E1165 Type 2 diabetes mellitus with hyperglycemia: Principal | ICD-10-CM

## 2014-05-06 NOTE — Progress Notes (Signed)
Patient was seen on 05/05/14 for the first of a series of three diabetes self-management courses at [facility].  Patient Education Plan per assessed needs and concerns is to attend four course education program for Diabetes Self Management Education.  Current HbA1c: 7.8%  The following learning objectives were met by the patient during this class:  Describe diabetes  State some common risk factors for diabetes  Defines the role of glucose and insulin  Identifies type of diabetes and pathophysiology  Describe the relationship between diabetes and cardiovascular risk  State the members of the Healthcare Team  States the rationale for glucose monitoring  State when to test glucose  State their individual Target Range  State the importance of logging glucose readings  Describe how to interpret glucose readings  Identifies A1C target  Explain the correlation between A1c and eAG values  State symptoms and treatment of high blood glucose  State symptoms and treatment of low blood glucose  Explain proper technique for glucose testing  Identifies proper sharps disposal  Handouts given during class include:  Living Well with Diabetes book  Carb Counting and Meal Planning book  Meal Plan Card  Carbohydrate guide  Meal planning worksheet  Low Sodium Flavoring Tips  The diabetes portion plate  X1G to eAG Conversion Chart  Diabetes Medications  Diabetes Recommended Care Schedule  Support Group  Diabetes Success Plan  Core Class Satisfaction Survey  Follow-Up Plan:  Attend core 2

## 2014-05-12 ENCOUNTER — Encounter: Payer: Managed Care, Other (non HMO) | Attending: Internal Medicine

## 2014-05-12 DIAGNOSIS — Z713 Dietary counseling and surveillance: Secondary | ICD-10-CM | POA: Diagnosis present

## 2014-05-12 DIAGNOSIS — IMO0001 Reserved for inherently not codable concepts without codable children: Secondary | ICD-10-CM | POA: Diagnosis present

## 2014-05-12 DIAGNOSIS — E1165 Type 2 diabetes mellitus with hyperglycemia: Principal | ICD-10-CM

## 2014-05-12 NOTE — Progress Notes (Signed)

## 2014-05-19 ENCOUNTER — Ambulatory Visit: Payer: Managed Care, Other (non HMO)

## 2014-05-19 HISTORY — PX: EYE SURGERY: SHX253

## 2014-06-09 ENCOUNTER — Encounter: Payer: Managed Care, Other (non HMO) | Attending: Internal Medicine

## 2014-06-09 DIAGNOSIS — Z713 Dietary counseling and surveillance: Secondary | ICD-10-CM | POA: Insufficient documentation

## 2014-06-09 DIAGNOSIS — IMO0001 Reserved for inherently not codable concepts without codable children: Secondary | ICD-10-CM | POA: Diagnosis present

## 2014-06-09 DIAGNOSIS — E1165 Type 2 diabetes mellitus with hyperglycemia: Principal | ICD-10-CM

## 2014-06-10 NOTE — Progress Notes (Signed)
Patient was seen on 06-09-14 for the third of a series of three diabetes self-management courses at the [facility]. The following learning objectives were met by the patient during this class:    State the amount of activity recommended for healthy living   Describe activities suitable for individual needs   Identify ways to regularly incorporate activity into daily life   Identify barriers to activity and ways to over come these barriers  Identify diabetes medications being personally used and their primary action for lowering glucose and possible side effects   Describe role of stress on blood glucose and develop strategies to address psychosocial issues   Identify diabetes complications and ways to prevent them  Explain how to manage diabetes during illness   Evaluate success in meeting personal goal   Establish 2-3 goals that they will plan to diligently work on until they return for the  85-monthfollow-up visit  Goals:  Follow Diabetes Meal Plan as instructed  Aim for 15-30 mins of physical activity daily as tolerated  Bring food record and glucose log to your follow up visit  Your patient has established the following 4 month goals in their individualized success plan: I will count my carb choices at most meals and snacks I will be active  more during the week   Your patient has identified these potential barriers to change:  Stress  Your patient has identified their diabetes self-care support plan as  Sister and Family  Plan:  Attend Core 4 in 4 months

## 2014-06-22 ENCOUNTER — Ambulatory Visit (INDEPENDENT_AMBULATORY_CARE_PROVIDER_SITE_OTHER): Payer: Managed Care, Other (non HMO) | Admitting: Internal Medicine

## 2014-06-22 ENCOUNTER — Encounter: Payer: Self-pay | Admitting: Internal Medicine

## 2014-06-22 VITALS — BP 118/66 | HR 86 | Temp 97.6°F | Resp 12 | Wt 165.0 lb

## 2014-06-22 DIAGNOSIS — E1165 Type 2 diabetes mellitus with hyperglycemia: Principal | ICD-10-CM

## 2014-06-22 DIAGNOSIS — IMO0001 Reserved for inherently not codable concepts without codable children: Secondary | ICD-10-CM

## 2014-06-22 NOTE — Progress Notes (Signed)
Patient ID: Sydney Avila, female   DOB: 04-Jul-1946, 68 y.o.   MRN: [id number]  HPI: Sydney Avila is a 68 y.o.-year-old female, returning for f/u for DM2, dx 2000, non-insulin-dependent, uncontrolled, without complications. Last visit 2 mo ago.  She had palpebral ptosis surgery 05/19/2014.   Last hemoglobin A1c was: Lab Results  Component Value Date   HGBA1C 7.8* 02/25/2014   HGBA1C 8.1* 10/27/2013   Pt was on a regimen of: - Invokamet 150-500 mg x2 taken 2x a day! She did not understand to take 2 tabs in am and start Metformin 1000 mg at night >> 600 mg Invokana a day! She had 2 yeast infections, not recently.  She is now on: - Invokana 300 mg daily - Metformin ER 1000 mg 2x a day with Lunch and dinner  Pt checks her sugars 3-4x a day and they are better: - am: 200 > 198 > 198 > 164 (lower every day) >> 127-167 (184 x 1) >> 129-163 - 2h after b'fast: 129-158 >> 104-163 - lunch: 98-123 >> 97-115 - 2h after lunch: 99-155 >> 109-127 - dinner: 102-154 >>106, 116 - 2h after dinner: 131-178 (208, 213) >> 114-143 No lows; she has hypoglycemia awareness at 68.   Pt's meals are: - Breakfast: yoghurt, fruit - Lunch: salad with tuna - Dinner: meat + vegetables - Snacks: 2x  Exercising by walking/gardening 2x a week.  - no CKD, last BUN/creatinine:  Lab Results  Component Value Date   BUN 21 02/25/2014   CREATININE 1.0 02/25/2014  On Lisinopril. - last set of lipids: Lab Results  Component Value Date   CHOL 107 10/27/2013   HDL 41 10/27/2013   LDLCALC 30 10/27/2013   TRIG 179* 10/27/2013  On Simvastatin. - last eye exam was in 01/2014 (Dr Gershon Crane). No DR. She had cataracts >> removed.  - no numbness and tingling in her feet.  I reviewed pt's medications, allergies, PMH, social hx, family hx and no changes required, except as mentioned above.  ROS: Constitutional: no weight gain/loss, no fatigue, no subjective hyperthermia/hypothermia, no excessive urination Eyes: no blurry  vision, no xerophthalmia ENT: no sore throat, no nodules palpated in throat, no dysphagia/odynophagia, no hoarseness Cardiovascular: no CP/SOB/palpitations/leg swelling Respiratory: no cough/SOB Gastrointestinal: no N/V/D/C Musculoskeletal: no muscle/no joint aches Skin: no rash Neurological: no tremors/numbness/tingling/dizziness  PE: BP 118/66  Pulse 86  Temp(Src) 97.6 F (36.4 C) (Oral)  Resp 12  Wt 165 lb (74.844 kg)  SpO2 96% Wt Readings from Last 3 Encounters:  06/22/14 165 lb (74.844 kg)  05/06/14 169 lb 9.6 oz (76.93 kg)  04/21/14 166 lb (75.297 kg)   Constitutional: overweight, in NAD Eyes: PERRLA, EOMI, no exophthalmos ENT: moist mucous membranes, no thyromegaly, no cervical lymphadenopathy Cardiovascular: RRR, No MRG Respiratory: CTA B Gastrointestinal: abdomen soft, NT, ND, BS+ Musculoskeletal: no deformities, strength intact in all 4 Skin: moist, warm, no rashes Neurological: no tremor with outstretched hands, DTR normal in all 4  ASSESSMENT: 1. DM2, non-insulin-dependent, uncontrolled, without complications  PLAN:  1. Patient with long-standing, uncontrolled diabetes, on oral antidiabetic regimen withMetformin ER and Invokana - We discussed about options for treatment, and I suggested to:  Patient Instructions  Please continue: - Invokana 300 mg daily (can try to take it at 5 am for 3 days to see if this helps) - Metformin ER 1000 mg 2x a day >> move this with breakfast and lunch Please return in 3 months with your sugar log.  Please stop at  the lab. - continue checking sugars at different times of the day - check 2 times a day, rotating checks - had nutrition appt - she is up to date with yearly eye exams   - will check HbA1c today - Return to clinic in 3 mo with sugar log   Office Visit on 06/22/2014  Component Date Value Ref Range Status  . Hemoglobin A1C 06/22/2014 6.6* 4.6 - 6.5 % Final   Glycemic Control Guidelines for People with Diabetes:Non  Diabetic:  <6%Goal of Therapy: <7%Additional Action Suggested:  >8%    HbA1c excellent!

## 2014-06-22 NOTE — Patient Instructions (Signed)
Please continue: - Invokana 300 mg daily (can try to take it at 5 am for 3 days to see if this helps) - Metformin ER 1000 mg 2x a day >> move this with breakfast and lunch  Please return in 3 months with your sugar log.   Please stop at the lab.

## 2014-06-24 ENCOUNTER — Telehealth: Payer: Self-pay | Admitting: Endocrinology

## 2014-06-24 LAB — HEMOGLOBIN A1C: HEMOGLOBIN A1C: 6.6 % — AB (ref 4.6–6.5)

## 2014-06-24 NOTE — Telephone Encounter (Signed)
See below, Thanks!  

## 2014-06-24 NOTE — Telephone Encounter (Signed)
Patient would like the A1C results please

## 2014-06-24 NOTE — Telephone Encounter (Signed)
Pt called requesting A1c lab results. Please advise.

## 2014-06-24 NOTE — Telephone Encounter (Signed)
Called pt and advised her A1c much better. It is 6.6.

## 2014-06-24 NOTE — Telephone Encounter (Signed)
I just sent it to her. It returned ~1 h ago.  Dear Ms Stann Mainland,  HbA1c just returned >> much better! Sincerely, Philemon Kingdom MD

## 2014-07-15 ENCOUNTER — Other Ambulatory Visit: Payer: Self-pay | Admitting: *Deleted

## 2014-07-15 DIAGNOSIS — F411 Generalized anxiety disorder: Secondary | ICD-10-CM

## 2014-07-16 ENCOUNTER — Telehealth: Payer: Self-pay | Admitting: *Deleted

## 2014-07-16 DIAGNOSIS — F411 Generalized anxiety disorder: Secondary | ICD-10-CM

## 2014-07-16 MED ORDER — ALPRAZOLAM 0.5 MG PO TABS: 0.5000 mg | ORAL_TABLET | Freq: Every evening | ORAL | Status: DC | PRN

## 2014-07-16 NOTE — Telephone Encounter (Signed)
Pharmacy faxed over refill request hasn't heard anything was just inquiring about it if you could refax it for her so she can get her medication

## 2014-07-16 NOTE — Telephone Encounter (Signed)
I can refill for only 1 month, but she will need a PCP for further refills.

## 2014-07-16 NOTE — Telephone Encounter (Signed)
Pt also called and said that she did not want to return to Dr Ronnald Ramp and would like for Dr Cruzita Lederer to please refill her alprazolam. Please advise.

## 2014-08-10 ENCOUNTER — Encounter: Payer: Self-pay | Admitting: Internal Medicine

## 2014-08-10 ENCOUNTER — Other Ambulatory Visit: Payer: Self-pay | Admitting: *Deleted

## 2014-08-10 DIAGNOSIS — F411 Generalized anxiety disorder: Secondary | ICD-10-CM

## 2014-08-11 ENCOUNTER — Other Ambulatory Visit: Payer: Self-pay | Admitting: Internal Medicine

## 2014-08-14 ENCOUNTER — Other Ambulatory Visit: Payer: Self-pay | Admitting: Internal Medicine

## 2014-08-14 NOTE — Telephone Encounter (Signed)
Rx last filled on 10.8.30 #30.  Pls advise.

## 2014-08-17 ENCOUNTER — Telehealth: Payer: Self-pay | Admitting: Internal Medicine

## 2014-08-17 NOTE — Telephone Encounter (Signed)
Please read note below and advise.  

## 2014-08-17 NOTE — Telephone Encounter (Signed)
Xanax needs to be refilled please call into [facility] thank you.

## 2014-08-17 NOTE — Telephone Encounter (Signed)
[phone] please call pt once this is complete

## 2014-09-09 NOTE — Telephone Encounter (Signed)
Please read note below and advise if ok. Thank you.

## 2014-09-09 NOTE — Telephone Encounter (Signed)
Patient is coming in for lab work A1C Jan 12@ 3:45 and ask if some orders can be put in. please advise

## 2014-09-10 ENCOUNTER — Other Ambulatory Visit: Payer: Self-pay | Admitting: *Deleted

## 2014-09-10 DIAGNOSIS — E1165 Type 2 diabetes mellitus with hyperglycemia: Secondary | ICD-10-CM

## 2014-09-10 DIAGNOSIS — IMO0002 Reserved for concepts with insufficient information to code with codable children: Secondary | ICD-10-CM

## 2014-09-10 NOTE — Telephone Encounter (Signed)
Done

## 2014-09-10 NOTE — Telephone Encounter (Signed)
OK, can put the order it but she needs to come after 09/21/2014 to be covered by her insurance.

## 2014-09-15 ENCOUNTER — Other Ambulatory Visit (HOSPITAL_BASED_OUTPATIENT_CLINIC_OR_DEPARTMENT_OTHER): Payer: Self-pay | Admitting: Orthopedic Surgery

## 2014-09-15 DIAGNOSIS — Z1231 Encounter for screening mammogram for malignant neoplasm of breast: Secondary | ICD-10-CM

## 2014-09-21 ENCOUNTER — Ambulatory Visit: Payer: Managed Care, Other (non HMO) | Admitting: Internal Medicine

## 2014-09-23 ENCOUNTER — Other Ambulatory Visit: Payer: Self-pay | Admitting: Plastic Surgery

## 2014-10-05 ENCOUNTER — Other Ambulatory Visit: Payer: Self-pay | Admitting: Dermatology

## 2014-10-10 ENCOUNTER — Other Ambulatory Visit: Payer: Self-pay | Admitting: Internal Medicine

## 2014-10-12 ENCOUNTER — Ambulatory Visit: Payer: Managed Care, Other (non HMO)

## 2014-10-12 LAB — HM DIABETES EYE EXAM

## 2014-10-17 ENCOUNTER — Other Ambulatory Visit: Payer: Self-pay | Admitting: Internal Medicine

## 2014-10-19 ENCOUNTER — Telehealth: Payer: Self-pay | Admitting: Internal Medicine

## 2014-10-19 ENCOUNTER — Other Ambulatory Visit: Payer: Self-pay | Admitting: Internal Medicine

## 2014-10-19 ENCOUNTER — Other Ambulatory Visit: Payer: Self-pay | Admitting: *Deleted

## 2014-10-19 MED ORDER — EMPAGLIFLOZIN 25 MG PO TABS: 25.0000 mg | ORAL_TABLET | Freq: Every day | ORAL | Status: DC

## 2014-10-19 MED ORDER — CANAGLIFLOZIN 300 MG PO TABS: ORAL_TABLET | ORAL | Status: DC

## 2014-10-19 NOTE — Telephone Encounter (Signed)
Patient states that her pharmacy has been sending over numerous requests for her xanax  Per patient please fill today as she has been out for over a week    Please advise   Thank you

## 2014-10-19 NOTE — Telephone Encounter (Signed)
Dr Ronnald Ramp is seeing her for Generalized anxiety disorder. I did not evaluate her for this and feel uncomfortable to prescribe it.

## 2014-10-19 NOTE — Telephone Encounter (Signed)
She stated she has an appt with Dr Ronnald Ramp in 3 weeks. She is scheduled to see you on 1/14.

## 2014-10-19 NOTE — Telephone Encounter (Signed)
Rx refill's have been denied due to Dr Cruzita Lederer advising the pt that she needs to get a PCP. Dr Cruzita Lederer has denied the refills because she is not a PCP and this needs to be managed by a PCP.

## 2014-10-19 NOTE — Telephone Encounter (Signed)
Medication change due to insurance. Change from Invokana to Jardiance 25 mg qd.

## 2014-10-19 NOTE — Telephone Encounter (Signed)
She needs to get it from PCP, I am not sure if she needs to be seen before obtaining it.

## 2014-10-19 NOTE — Telephone Encounter (Signed)
Informed pt of note regarding her xanax. She cannot get in to see her PCP right now and is asking for just one more refill to hold until 3 wks Dr. Ronnald Ramp appt

## 2014-10-19 NOTE — Telephone Encounter (Signed)
Please read note below and advise.  

## 2014-10-20 ENCOUNTER — Telehealth: Payer: Self-pay

## 2014-10-20 ENCOUNTER — Other Ambulatory Visit (INDEPENDENT_AMBULATORY_CARE_PROVIDER_SITE_OTHER): Payer: Managed Care, Other (non HMO)

## 2014-10-20 DIAGNOSIS — E1165 Type 2 diabetes mellitus with hyperglycemia: Secondary | ICD-10-CM

## 2014-10-20 DIAGNOSIS — F411 Generalized anxiety disorder: Secondary | ICD-10-CM

## 2014-10-20 DIAGNOSIS — IMO0002 Reserved for concepts with insufficient information to code with codable children: Secondary | ICD-10-CM

## 2014-10-20 LAB — HEMOGLOBIN A1C: Hgb A1c MFr Bld: 6.8 % — ABNORMAL HIGH (ref 4.6–6.5)

## 2014-10-20 MED ORDER — ALPRAZOLAM 0.5 MG PO TABS
0.5000 mg | ORAL_TABLET | Freq: Every evening | ORAL | Status: DC | PRN
Start: 2014-10-20 — End: 2014-10-22

## 2014-10-20 NOTE — Telephone Encounter (Signed)
rx request for alprazolam 0.5 mg to [facility] [phone] fax)

## 2014-10-20 NOTE — Telephone Encounter (Signed)
done

## 2014-10-22 ENCOUNTER — Other Ambulatory Visit (INDEPENDENT_AMBULATORY_CARE_PROVIDER_SITE_OTHER): Payer: Managed Care, Other (non HMO)

## 2014-10-22 ENCOUNTER — Ambulatory Visit (INDEPENDENT_AMBULATORY_CARE_PROVIDER_SITE_OTHER): Payer: Managed Care, Other (non HMO) | Admitting: Internal Medicine

## 2014-10-22 ENCOUNTER — Encounter: Payer: Self-pay | Admitting: Internal Medicine

## 2014-10-22 VITALS — BP 114/62 | HR 99 | Temp 98.2°F | Resp 12 | Wt 164.0 lb

## 2014-10-22 VITALS — BP 128/80 | HR 80 | Temp 98.2°F | Resp 16 | Ht 62.0 in | Wt 164.0 lb

## 2014-10-22 DIAGNOSIS — E8940 Asymptomatic postprocedural ovarian failure: Secondary | ICD-10-CM

## 2014-10-22 DIAGNOSIS — E1165 Type 2 diabetes mellitus with hyperglycemia: Secondary | ICD-10-CM

## 2014-10-22 DIAGNOSIS — E785 Hyperlipidemia, unspecified: Secondary | ICD-10-CM

## 2014-10-22 DIAGNOSIS — IMO0002 Reserved for concepts with insufficient information to code with codable children: Secondary | ICD-10-CM

## 2014-10-22 DIAGNOSIS — F411 Generalized anxiety disorder: Secondary | ICD-10-CM

## 2014-10-22 DIAGNOSIS — I10 Essential (primary) hypertension: Secondary | ICD-10-CM

## 2014-10-22 DIAGNOSIS — E119 Type 2 diabetes mellitus without complications: Secondary | ICD-10-CM

## 2014-10-22 DIAGNOSIS — Z0000 Encounter for general adult medical examination without abnormal findings: Secondary | ICD-10-CM

## 2014-10-22 LAB — CBC WITH DIFFERENTIAL/PLATELET
BASOS ABS: 0 10*3/uL (ref 0.0–0.1)
Basophils Relative: 0.6 % (ref 0.0–3.0)
EOS PCT: 0.9 % (ref 0.0–5.0)
Eosinophils Absolute: 0.1 10*3/uL (ref 0.0–0.7)
HEMATOCRIT: 41.9 % (ref 36.0–46.0)
Hemoglobin: 13.7 g/dL (ref 12.0–15.0)
LYMPHS ABS: 2.2 10*3/uL (ref 0.7–4.0)
Lymphocytes Relative: 28.5 % (ref 12.0–46.0)
MCHC: 32.7 g/dL (ref 30.0–36.0)
MCV: 91.2 fl (ref 78.0–100.0)
MONOS PCT: 5.5 % (ref 3.0–12.0)
Monocytes Absolute: 0.4 10*3/uL (ref 0.1–1.0)
Neutro Abs: 5 10*3/uL (ref 1.4–7.7)
Neutrophils Relative %: 64.5 % (ref 43.0–77.0)
Platelets: 248 10*3/uL (ref 150.0–400.0)
RBC: 4.59 Mil/uL (ref 3.87–5.11)
RDW: 13.5 % (ref 11.5–15.5)
WBC: 7.8 10*3/uL (ref 4.0–10.5)

## 2014-10-22 LAB — COMPREHENSIVE METABOLIC PANEL
ALT: 12 U/L (ref 0–35)
AST: 13 U/L (ref 0–37)
Albumin: 4.6 g/dL (ref 3.5–5.2)
Alkaline Phosphatase: 54 U/L (ref 39–117)
BUN: 24 mg/dL — AB (ref 6–23)
CO2: 25 mEq/L (ref 19–32)
Calcium: 9.3 mg/dL (ref 8.4–10.5)
Chloride: 104 mEq/L (ref 96–112)
Creatinine, Ser: 0.85 mg/dL (ref 0.40–1.20)
GFR: 70.1 mL/min (ref >60.00)
Glucose, Bld: 106 mg/dL — ABNORMAL HIGH (ref 70–99)
Potassium: 4.1 mEq/L (ref 3.5–5.1)
Sodium: 137 mEq/L (ref 135–145)
Total Bilirubin: 0.9 mg/dL (ref 0.2–1.2)
Total Protein: 7.1 g/dL (ref 6.0–8.3)

## 2014-10-22 LAB — LIPID PANEL
CHOL/HDL RATIO: 3
Cholesterol: 122 mg/dL (ref 0–200)
HDL: 42 mg/dL (ref >39.00)
NonHDL: 80
Triglycerides: 211 mg/dL — ABNORMAL HIGH (ref 0.0–149.0)
VLDL: 42.2 mg/dL — ABNORMAL HIGH (ref 0.0–40.0)

## 2014-10-22 LAB — TSH: TSH: 3.03 u[IU]/mL (ref 0.35–4.50)

## 2014-10-22 LAB — LDL CHOLESTEROL, DIRECT: Direct LDL: 50 mg/dL

## 2014-10-22 MED ORDER — ESTRADIOL 0.06 MG/24HR TD PTWK: 1.0000 | MEDICATED_PATCH | TRANSDERMAL | Status: DC

## 2014-10-22 MED ORDER — ALPRAZOLAM 0.5 MG PO TABS: 0.5000 mg | ORAL_TABLET | Freq: Every evening | ORAL | Status: DC | PRN

## 2014-10-22 MED ORDER — LISINOPRIL 10 MG PO TABS: 10.0000 mg | ORAL_TABLET | Freq: Every day | ORAL | Status: DC

## 2014-10-22 MED ORDER — SIMVASTATIN 20 MG PO TABS: 20.0000 mg | ORAL_TABLET | Freq: Every evening | ORAL | Status: DC

## 2014-10-22 NOTE — Progress Notes (Signed)
Patient ID: Sydney Avila, female   DOB: 05-Feb-1946, 69 y.o.   MRN: [id number]  HPI: Sydney Avila is a 69 y.o.-year-old female, returning for f/u for DM2, dx 2000, non-insulin-dependent, uncontrolled, without complications. Last visit 3 mo ago.  Last hemoglobin A1c was: Lab Results  Component Value Date   HGBA1C 6.8* 10/20/2014   HGBA1C 6.6* 06/22/2014   HGBA1C 7.8* 02/25/2014   Pt was on a regimen of: - Invokamet 150-500 mg x2 taken 2x a day! She did not understand to take 2 tabs in am and start Metformin 1000 mg at night >> 600 mg Invokana a day! She had 2 yeast infections, not recently.  She is now on: - Invokana 300 mg daily >> need to change to Jardiance 25 2/2 insurance - Metformin ER 1000 mg 2x a day with Lunch and dinner  She still works full time and is worried if the regimen will be too expensive for her then.  Pt checks her sugars 3-4x a day and they are better: - am: 200 > 198 > 198 > 164 (lower every day) >> 127-167 (184 x 1) >> 129-163 >> 149-161 - 2h after b'fast: 129-158 >> 104-163 >> 99-112 - lunch: 98-123 >> 97-115 - 2h after lunch: 99-155 >> 109-127 >> 100-112 - dinner: 102-154 >>106, 116 - 2h after dinner: 131-178 (208, 213) >> 114-143 >> n/c No lows; she has hypoglycemia awareness at 68.   Pt's meals are: - Breakfast: yoghurt, fruit - Lunch: salad with tuna - Dinner: meat + vegetables - Snacks: 2x  Exercising by walking/gardening 2x a week.  - no CKD, last BUN/creatinine:  Lab Results  Component Value Date   BUN 24* 10/22/2014   CREATININE 0.85 10/22/2014  On Lisinopril. - last set of lipids: Lab Results  Component Value Date   CHOL 122 10/22/2014   HDL 42.00 10/22/2014   LDLCALC 30 10/27/2013   LDLDIRECT 50.0 10/22/2014   TRIG 211.0* 10/22/2014   CHOLHDL 3 10/22/2014  On Simvastatin. - last eye exam was in 10/12/2014 (Dr Sydney Avila). No DR. She had cataracts >> removed.  - no numbness and tingling in her feet.  I reviewed pt's medications,  allergies, PMH, social hx, family hx, and changes were documented in the history of present illness. Otherwise, unchanged from my initial visit note.  ROS: Constitutional: no weight gain/loss, no fatigue, no subjective hyperthermia/hypothermia, no excessive urination Eyes: no blurry vision, no xerophthalmia ENT: no sore throat, no nodules palpated in throat, no dysphagia/odynophagia, no hoarseness Cardiovascular: no CP/SOB/palpitations/leg swelling Respiratory: no cough/SOB Gastrointestinal: no N/V/D/C Musculoskeletal: no muscle/no joint aches Skin: no rash, + easy bruising Neurological: no tremors/numbness/tingling/dizziness  PE: BP 114/62 mmHg  Pulse 99  Temp(Src) 98.2 F (36.8 C) (Oral)  Resp 12  Wt 164 lb (74.39 kg)  SpO2 94% Wt Readings from Last 3 Encounters:  10/22/14 164 lb (74.39 kg)  10/22/14 164 lb (74.39 kg)  06/22/14 165 lb (74.844 kg)   Constitutional: overweight, in NAD Eyes: PERRLA, EOMI, no exophthalmos ENT: moist mucous membranes, no thyromegaly, no cervical lymphadenopathy Cardiovascular: RRR, No MRG Respiratory: CTA B Gastrointestinal: abdomen soft, NT, ND, BS+ Musculoskeletal: no deformities, strength intact in all 4 Skin: moist, warm, no rashes Neurological: no tremor with outstretched hands, DTR normal in all 4  ASSESSMENT: 1. DM2, non-insulin-dependent, uncontrolled, without complications  PLAN:  1. Patient with long-standing, now well controlled diabetes, on oral antidiabetic regimen with Metformin ER and Invokana >> now we need to change to Jardiance 2/2  pharmacy coverage - We discussed about options for treatment, and I suggested to:   Patient Instructions  Please start: - Jardiance 25 mg daily in am Continue: - Metformin ER 1000 mg 2x a day with breakfast and lunch Please return in 3 months with your sugar log.   - continue checking sugars at different times of the day - check 2 times a day, rotating checks - she is up to date with yearly  eye exams   - reviewed recent HbA1c with the pt >> excellent - also reviewed the labs she had today with PCP >> all good (TG higher, but she was not fasting) - Return to clinic in 3 mo with sugar log

## 2014-10-22 NOTE — Patient Instructions (Signed)
Patient Instructions  Please start: - Jardiance 25 mg daily in am Continue: - Metformin ER 1000 mg 2x a day with breakfast and lunch Please return in 3 months with your sugar log.

## 2014-10-22 NOTE — Patient Instructions (Signed)
Hypertension Hypertension, commonly called high blood pressure, is when the force of blood pumping through your arteries is too strong. Your arteries are the blood vessels that carry blood from your heart throughout your body. A blood pressure reading consists of a higher number over a lower number, such as 110/72. The higher number (systolic) is the pressure inside your arteries when your heart pumps. The lower number (diastolic) is the pressure inside your arteries when your heart relaxes. Ideally you want your blood pressure below 120/80. Hypertension forces your heart to work harder to pump blood. Your arteries may become narrow or stiff. Having hypertension puts you at risk for heart disease, stroke, and other problems.  RISK FACTORS Some risk factors for high blood pressure are controllable. Others are not.  Risk factors you cannot control include:   Race. You may be at higher risk if you are African American.  Age. Risk increases with age.  Gender. Men are at higher risk than women before age 45 years. After age 65, women are at higher risk than men. Risk factors you can control include:  Not getting enough exercise or physical activity.  Being overweight.  Getting too much fat, sugar, calories, or salt in your diet.  Drinking too much alcohol. SIGNS AND SYMPTOMS Hypertension does not usually cause signs or symptoms. Extremely high blood pressure (hypertensive crisis) may cause headache, anxiety, shortness of breath, and nosebleed. DIAGNOSIS  To check if you have hypertension, your health care provider will measure your blood pressure while you are seated, with your arm held at the level of your heart. It should be measured at least twice using the same arm. Certain conditions can cause a difference in blood pressure between your right and left arms. A blood pressure reading that is higher than normal on one occasion does not mean that you need treatment. If one blood pressure reading  is high, ask your health care provider about having it checked again. TREATMENT  Treating high blood pressure includes making lifestyle changes and possibly taking medicine. Living a healthy lifestyle can help lower high blood pressure. You may need to change some of your habits. Lifestyle changes may include:  Following the DASH diet. This diet is high in fruits, vegetables, and whole grains. It is low in salt, red meat, and added sugars.  Getting at least 2 hours of brisk physical activity every week.  Losing weight if necessary.  Not smoking.  Limiting alcoholic beverages.  Learning ways to reduce stress. If lifestyle changes are not enough to get your blood pressure under control, your health care provider may prescribe medicine. You may need to take more than one. Work closely with your health care provider to understand the risks and benefits. HOME CARE INSTRUCTIONS  Have your blood pressure rechecked as directed by your health care provider.   Take medicines only as directed by your health care provider. Follow the directions carefully. Blood pressure medicines must be taken as prescribed. The medicine does not work as well when you skip doses. Skipping doses also puts you at risk for problems.   Do not smoke.   Monitor your blood pressure at home as directed by your health care provider. SEEK MEDICAL CARE IF:   You think you are having a reaction to medicines taken.  You have recurrent headaches or feel dizzy.  You have swelling in your ankles.  You have trouble with your vision. SEEK IMMEDIATE MEDICAL CARE IF:  You develop a severe headache or confusion.    You have unusual weakness, numbness, or feel faint.  You have severe chest or abdominal pain.  You vomit repeatedly.  You have trouble breathing. MAKE SURE YOU:   Understand these instructions.  Will watch your condition.  Will get help right away if you are not doing well or get worse. Document  Released: 09/25/2005 Document Revised: 02/09/2014 Document Reviewed: 07/18/2013 [facility] Patient Information 2015 [place], [place]. This information is not intended to replace advice given to you by your health care provider. Make sure you discuss any questions you have with your health care provider. Preventive Care for Adults A healthy lifestyle and preventive care can promote health and wellness. Preventive health guidelines for women include the following key practices.  A routine yearly physical is a good way to check with your health care provider about your health and preventive screening. It is a chance to share any concerns and updates on your health and to receive a thorough exam.  Visit your dentist for a routine exam and preventive care every 6 months. Brush your teeth twice a day and floss once a day. Good oral hygiene prevents tooth decay and gum disease.  The frequency of eye exams is based on your age, health, family medical history, use of contact lenses, and other factors. Follow your health care provider's recommendations for frequency of eye exams.  Eat a healthy diet. Foods like vegetables, fruits, whole grains, low-fat dairy products, and lean protein foods contain the nutrients you need without too many calories. Decrease your intake of foods high in solid fats, added sugars, and salt. Eat the right amount of calories for you.Get information about a proper diet from your health care provider, if necessary.  Regular physical exercise is one of the most important things you can do for your health. Most adults should get at least 150 minutes of moderate-intensity exercise (any activity that increases your heart rate and causes you to sweat) each week. In addition, most adults need muscle-strengthening exercises on 2 or more days a week.  Maintain a healthy weight. The body mass index (BMI) is a screening tool to identify possible weight problems. It provides an estimate of body fat  based on height and weight. Your health care provider can find your BMI and can help you achieve or maintain a healthy weight.For adults 20 years and older:  A BMI below 18.5 is considered underweight.  A BMI of 18.5 to 24.9 is normal.  A BMI of 25 to 29.9 is considered overweight.  A BMI of 30 and above is considered obese.  Maintain normal blood lipids and cholesterol levels by exercising and minimizing your intake of saturated fat. Eat a balanced diet with plenty of fruit and vegetables. Blood tests for lipids and cholesterol should begin at age 43 and be repeated every 5 years. If your lipid or cholesterol levels are high, you are over 50, or you are at high risk for heart disease, you may need your cholesterol levels checked more frequently.Ongoing high lipid and cholesterol levels should be treated with medicines if diet and exercise are not working.  If you smoke, find out from your health care provider how to quit. If you do not use tobacco, do not start.  Lung cancer screening is recommended for adults aged 21-80 years who are at high risk for developing lung cancer because of a history of smoking. A yearly low-dose CT scan of the lungs is recommended for people who have at least a 30-pack-year history of smoking  and are a current smoker or have quit within the past 15 years. A pack year of smoking is smoking an average of 1 pack of cigarettes a day for 1 year (for example: 1 pack a day for 30 years or 2 packs a day for 15 years). Yearly screening should continue until the smoker has stopped smoking for at least 15 years. Yearly screening should be stopped for people who develop a health problem that would prevent them from having lung cancer treatment.  If you are pregnant, do not drink alcohol. If you are breastfeeding, be very cautious about drinking alcohol. If you are not pregnant and choose to drink alcohol, do not have more than 1 drink per day. One drink is considered to be 12  ounces (355 mL) of beer, 5 ounces (148 mL) of wine, or 1.5 ounces (44 mL) of liquor.  Avoid use of street drugs. Do not share needles with anyone. Ask for help if you need support or instructions about stopping the use of drugs.  High blood pressure causes heart disease and increases the risk of stroke. Your blood pressure should be checked at least every 1 to 2 years. Ongoing high blood pressure should be treated with medicines if weight loss and exercise do not work.  If you are 54-39 years old, ask your health care provider if you should take aspirin to prevent strokes.  Diabetes screening involves taking a blood sample to check your fasting blood sugar level. This should be done once every 3 years, after age 66, if you are within normal weight and without risk factors for diabetes. Testing should be considered at a younger age or be carried out more frequently if you are overweight and have at least 1 risk factor for diabetes.  Breast cancer screening is essential preventive care for women. You should practice "breast self-awareness." This means understanding the normal appearance and feel of your breasts and may include breast self-examination. Any changes detected, no matter how small, should be reported to a health care provider. Women in their 71s and 30s should have a clinical breast exam (CBE) by a health care provider as part of a regular health exam every 1 to 3 years. After age 42, women should have a CBE every year. Starting at age 47, women should consider having a mammogram (breast X-ray test) every year. Women who have a family history of breast cancer should talk to their health care provider about genetic screening. Women at a high risk of breast cancer should talk to their health care providers about having an MRI and a mammogram every year.  Breast cancer gene (BRCA)-related cancer risk assessment is recommended for women who have family members with BRCA-related cancers.  BRCA-related cancers include breast, ovarian, tubal, and peritoneal cancers. Having family members with these cancers may be associated with an increased risk for harmful changes (mutations) in the breast cancer genes BRCA1 and BRCA2. Results of the assessment will determine the need for genetic counseling and BRCA1 and BRCA2 testing.  Routine pelvic exams to screen for cancer are no longer recommended for nonpregnant women who are considered low risk for cancer of the pelvic organs (ovaries, uterus, and vagina) and who do not have symptoms. Ask your health care provider if a screening pelvic exam is right for you.  If you have had past treatment for cervical cancer or a condition that could lead to cancer, you need Pap tests and screening for cancer for at least 20 years after  your treatment. If Pap tests have been discontinued, your risk factors (such as having a new sexual partner) need to be reassessed to determine if screening should be resumed. Some women have medical problems that increase the chance of getting cervical cancer. In these cases, your health care provider may recommend more frequent screening and Pap tests.  The HPV test is an additional test that may be used for cervical cancer screening. The HPV test looks for the virus that can cause the cell changes on the cervix. The cells collected during the Pap test can be tested for HPV. The HPV test could be used to screen women aged 71 years and older, and should be used in women of any age who have unclear Pap test results. After the age of 78, women should have HPV testing at the same frequency as a Pap test.  Colorectal cancer can be detected and often prevented. Most routine colorectal cancer screening begins at the age of 62 years and continues through age 65 years. However, your health care provider may recommend screening at an earlier age if you have risk factors for colon cancer. On a yearly basis, your health care provider may  provide home test kits to check for hidden blood in the stool. Use of a small camera at the end of a tube, to directly examine the colon (sigmoidoscopy or colonoscopy), can detect the earliest forms of colorectal cancer. Talk to your health care provider about this at age 66, when routine screening begins. Direct exam of the colon should be repeated every 5-10 years through age 5 years, unless early forms of pre-cancerous polyps or small growths are found.  People who are at an increased risk for hepatitis B should be screened for this virus. You are considered at high risk for hepatitis B if:  You were born in a country where hepatitis B occurs often. Talk with your health care provider about which countries are considered high risk.  Your parents were born in a high-risk country and you have not received a shot to protect against hepatitis B (hepatitis B vaccine).  You have HIV or AIDS.  You use needles to inject street drugs.  You live with, or have sex with, someone who has hepatitis B.  You get hemodialysis treatment.  You take certain medicines for conditions like cancer, organ transplantation, and autoimmune conditions.  Hepatitis C blood testing is recommended for all people born from [birth year] through 1967 and any individual with known risks for hepatitis C.  Practice safe sex. Use condoms and avoid high-risk sexual practices to reduce the spread of sexually transmitted infections (STIs). STIs include gonorrhea, chlamydia, syphilis, trichomonas, herpes, HPV, and human immunodeficiency virus (HIV). Herpes, HIV, and HPV are viral illnesses that have no cure. They can result in disability, cancer, and death.  You should be screened for sexually transmitted illnesses (STIs) including gonorrhea and chlamydia if:  You are sexually active and are younger than 24 years.  You are older than 24 years and your health care provider tells you that you are at risk for this type of  infection.  Your sexual activity has changed since you were last screened and you are at an increased risk for chlamydia or gonorrhea. Ask your health care provider if you are at risk.  If you are at risk of being infected with HIV, it is recommended that you take a prescription medicine daily to prevent HIV infection. This is called preexposure prophylaxis (PrEP). You are considered  at risk if:  You are a heterosexual woman, are sexually active, and are at increased risk for HIV infection.  You take drugs by injection.  You are sexually active with a partner who has HIV.  Talk with your health care provider about whether you are at high risk of being infected with HIV. If you choose to begin PrEP, you should first be tested for HIV. You should then be tested every 3 months for as long as you are taking PrEP.  Osteoporosis is a disease in which the bones lose minerals and strength with aging. This can result in serious bone fractures or breaks. The risk of osteoporosis can be identified using a bone density scan. Women ages 16 years and over and women at risk for fractures or osteoporosis should discuss screening with their health care providers. Ask your health care provider whether you should take a calcium supplement or vitamin D to reduce the rate of osteoporosis.  Menopause can be associated with physical symptoms and risks. Hormone replacement therapy is available to decrease symptoms and risks. You should talk to your health care provider about whether hormone replacement therapy is right for you.  Use sunscreen. Apply sunscreen liberally and repeatedly throughout the day. You should seek shade when your shadow is shorter than you. Protect yourself by wearing long sleeves, pants, a wide-brimmed hat, and sunglasses year round, whenever you are outdoors.  Once a month, do a whole body skin exam, using a mirror to look at the skin on your back. Tell your health care provider of new moles,  moles that have irregular borders, moles that are larger than a pencil eraser, or moles that have changed in shape or color.  Stay current with required vaccines (immunizations).  Influenza vaccine. All adults should be immunized every year.  Tetanus, diphtheria, and acellular pertussis (Td, Tdap) vaccine. Pregnant women should receive 1 dose of Tdap vaccine during each pregnancy. The dose should be obtained regardless of the length of time since the last dose. Immunization is preferred during the 27th-36th week of gestation. An adult who has not previously received Tdap or who does not know her vaccine status should receive 1 dose of Tdap. This initial dose should be followed by tetanus and diphtheria toxoids (Td) booster doses every 10 years. Adults with an unknown or incomplete history of completing a 3-dose immunization series with Td-containing vaccines should begin or complete a primary immunization series including a Tdap dose. Adults should receive a Td booster every 10 years.  Varicella vaccine. An adult without evidence of immunity to varicella should receive 2 doses or a second dose if she has previously received 1 dose. Pregnant females who do not have evidence of immunity should receive the first dose after pregnancy. This first dose should be obtained before leaving the health care facility. The second dose should be obtained 4-8 weeks after the first dose.  Human papillomavirus (HPV) vaccine. Females aged 13-26 years who have not received the vaccine previously should obtain the 3-dose series. The vaccine is not recommended for use in pregnant females. However, pregnancy testing is not needed before receiving a dose. If a female is found to be pregnant after receiving a dose, no treatment is needed. In that case, the remaining doses should be delayed until after the pregnancy. Immunization is recommended for any person with an immunocompromised condition through the age of 95 years if she  did not get any or all doses earlier. During the 3-dose series, the second  dose should be obtained 4-8 weeks after the first dose. The third dose should be obtained 24 weeks after the first dose and 16 weeks after the second dose.  Zoster vaccine. One dose is recommended for adults aged 86 years or older unless certain conditions are present.  Measles, mumps, and rubella (MMR) vaccine. Adults born before [birth year] generally are considered immune to measles and mumps. Adults born in [birth year] or later should have 1 or more doses of MMR vaccine unless there is a contraindication to the vaccine or there is laboratory evidence of immunity to each of the three diseases. A routine second dose of MMR vaccine should be obtained at least 28 days after the first dose for students attending postsecondary schools, health care workers, or international travelers. People who received inactivated measles vaccine or an unknown type of measles vaccine during 1963-1967 should receive 2 doses of MMR vaccine. People who received inactivated mumps vaccine or an unknown type of mumps vaccine before 1979 and are at high risk for mumps infection should consider immunization with 2 doses of MMR vaccine. For females of childbearing age, rubella immunity should be determined. If there is no evidence of immunity, females who are not pregnant should be vaccinated. If there is no evidence of immunity, females who are pregnant should delay immunization until after pregnancy. Unvaccinated health care workers born before [birth year] who lack laboratory evidence of measles, mumps, or rubella immunity or laboratory confirmation of disease should consider measles and mumps immunization with 2 doses of MMR vaccine or rubella immunization with 1 dose of MMR vaccine.  Pneumococcal 13-valent conjugate (PCV13) vaccine. When indicated, a person who is uncertain of her immunization history and has no record of immunization should receive the PCV13 vaccine. An adult  aged 74 years or older who has certain medical conditions and has not been previously immunized should receive 1 dose of PCV13 vaccine. This PCV13 should be followed with a dose of pneumococcal polysaccharide (PPSV23) vaccine. The PPSV23 vaccine dose should be obtained at least 8 weeks after the dose of PCV13 vaccine. An adult aged 53 years or older who has certain medical conditions and previously received 1 or more doses of PPSV23 vaccine should receive 1 dose of PCV13. The PCV13 vaccine dose should be obtained 1 or more years after the last PPSV23 vaccine dose.  Pneumococcal polysaccharide (PPSV23) vaccine. When PCV13 is also indicated, PCV13 should be obtained first. All adults aged 25 years and older should be immunized. An adult younger than age 79 years who has certain medical conditions should be immunized. Any person who resides in a nursing home or long-term care facility should be immunized. An adult smoker should be immunized. People with an immunocompromised condition and certain other conditions should receive both PCV13 and PPSV23 vaccines. People with human immunodeficiency virus (HIV) infection should be immunized as soon as possible after diagnosis. Immunization during chemotherapy or radiation therapy should be avoided. Routine use of PPSV23 vaccine is not recommended for American Indians, [place] Natives, or people younger than 65 years unless there are medical conditions that require PPSV23 vaccine. When indicated, people who have unknown immunization and have no record of immunization should receive PPSV23 vaccine. One-time revaccination 5 years after the first dose of PPSV23 is recommended for people aged 19-64 years who have chronic kidney failure, nephrotic syndrome, asplenia, or immunocompromised conditions. People who received 1-2 doses of PPSV23 before age 61 years should receive another dose of PPSV23 vaccine at age 31 years or later if  at least 5 years have passed since the previous  dose. Doses of PPSV23 are not needed for people immunized with PPSV23 at or after age 46 years.  Meningococcal vaccine. Adults with asplenia or persistent complement component deficiencies should receive 2 doses of quadrivalent meningococcal conjugate (MenACWY-D) vaccine. The doses should be obtained at least 2 months apart. Microbiologists working with certain meningococcal bacteria, [place] recruits, people at risk during an outbreak, and people who travel to or live in countries with a high rate of meningitis should be immunized. A first-year college student up through age 76 years who is living in a residence hall should receive a dose if she did not receive a dose on or after her 16th birthday. Adults who have certain high-risk conditions should receive one or more doses of vaccine.  Hepatitis A vaccine. Adults who wish to be protected from this disease, have certain high-risk conditions, work with hepatitis A-infected animals, work in hepatitis A research labs, or travel to or work in countries with a high rate of hepatitis A should be immunized. Adults who were previously unvaccinated and who anticipate close contact with an international adoptee during the first 60 days after arrival in the [place] from a country with a high rate of hepatitis A should be immunized.  Hepatitis B vaccine. Adults who wish to be protected from this disease, have certain high-risk conditions, may be exposed to blood or other infectious body fluids, are household contacts or sex partners of hepatitis B positive people, are clients or workers in certain care facilities, or travel to or work in countries with a high rate of hepatitis B should be immunized.  Haemophilus influenzae type b (Hib) vaccine. A previously unvaccinated person with asplenia or sickle cell disease or having a scheduled splenectomy should receive 1 dose of Hib vaccine. Regardless of previous immunization, a recipient of a hematopoietic stem cell  transplant should receive a 3-dose series 6-12 months after her successful transplant. Hib vaccine is not recommended for adults with HIV infection. Preventive Services / Frequency Ages 67 to 70 years  Blood pressure check.** / Every 1 to 2 years.  Lipid and cholesterol check.** / Every 5 years beginning at age 80.  Clinical breast exam.** / Every 3 years for women in their 66s and 71s.  BRCA-related cancer risk assessment.** / For women who have family members with a BRCA-related cancer (breast, ovarian, tubal, or peritoneal cancers).  Pap test.** / Every 2 years from ages 77 through 25. Every 3 years starting at age 58 through age 46 or 74 with a history of 3 consecutive normal Pap tests.  HPV screening.** / Every 3 years from ages 67 through ages 9 to 54 with a history of 3 consecutive normal Pap tests.  Hepatitis C blood test.** / For any individual with known risks for hepatitis C.  Skin self-exam. / Monthly.  Influenza vaccine. / Every year.  Tetanus, diphtheria, and acellular pertussis (Tdap, Td) vaccine.** / Consult your health care provider. Pregnant women should receive 1 dose of Tdap vaccine during each pregnancy. 1 dose of Td every 10 years.  Varicella vaccine.** / Consult your health care provider. Pregnant females who do not have evidence of immunity should receive the first dose after pregnancy.  HPV vaccine. / 3 doses over 6 months, if 57 and younger. The vaccine is not recommended for use in pregnant females. However, pregnancy testing is not needed before receiving a dose.  Measles, mumps, rubella (MMR) vaccine.** / You need  at least 1 dose of MMR if you were born in 1957 or later. You may also need a 2nd dose. For females of childbearing age, rubella immunity should be determined. If there is no evidence of immunity, females who are not pregnant should be vaccinated. If there is no evidence of immunity, females who are pregnant should delay immunization until after  pregnancy.  Pneumococcal 13-valent conjugate (PCV13) vaccine.** / Consult your health care provider.  Pneumococcal polysaccharide (PPSV23) vaccine.** / 1 to 2 doses if you smoke cigarettes or if you have certain conditions.  Meningococcal vaccine.** / 1 dose if you are age 27 to 78 years and a Market researcher living in a residence hall, or have one of several medical conditions, you need to get vaccinated against meningococcal disease. You may also need additional booster doses.  Hepatitis A vaccine.** / Consult your health care provider.  Hepatitis B vaccine.** / Consult your health care provider.  Haemophilus influenzae type b (Hib) vaccine.** / Consult your health care provider. Ages 86 to 75 years  Blood pressure check.** / Every 1 to 2 years.  Lipid and cholesterol check.** / Every 5 years beginning at age 69 years.  Lung cancer screening. / Every year if you are aged 28-80 years and have a 30-pack-year history of smoking and currently smoke or have quit within the past 15 years. Yearly screening is stopped once you have quit smoking for at least 15 years or develop a health problem that would prevent you from having lung cancer treatment.  Clinical breast exam.** / Every year after age 45 years.  BRCA-related cancer risk assessment.** / For women who have family members with a BRCA-related cancer (breast, ovarian, tubal, or peritoneal cancers).  Mammogram.** / Every year beginning at age 71 years and continuing for as long as you are in good health. Consult with your health care provider.  Pap test.** / Every 3 years starting at age 67 years through age 4 or 11 years with a history of 3 consecutive normal Pap tests.  HPV screening.** / Every 3 years from ages 8 years through ages 8 to 47 years with a history of 3 consecutive normal Pap tests.  Fecal occult blood test (FOBT) of stool. / Every year beginning at age 26 years and continuing until age 72 years. You may  not need to do this test if you get a colonoscopy every 10 years.  Flexible sigmoidoscopy or colonoscopy.** / Every 5 years for a flexible sigmoidoscopy or every 10 years for a colonoscopy beginning at age 38 years and continuing until age 12 years.  Hepatitis C blood test.** / For all people born from [birth year] through 1967 and any individual with known risks for hepatitis C.  Skin self-exam. / Monthly.  Influenza vaccine. / Every year.  Tetanus, diphtheria, and acellular pertussis (Tdap/Td) vaccine.** / Consult your health care provider. Pregnant women should receive 1 dose of Tdap vaccine during each pregnancy. 1 dose of Td every 10 years.  Varicella vaccine.** / Consult your health care provider. Pregnant females who do not have evidence of immunity should receive the first dose after pregnancy.  Zoster vaccine.** / 1 dose for adults aged 80 years or older.  Measles, mumps, rubella (MMR) vaccine.** / You need at least 1 dose of MMR if you were born in 1957 or later. You may also need a 2nd dose. For females of childbearing age, rubella immunity should be determined. If there is no evidence of immunity, females who  are not pregnant should be vaccinated. If there is no evidence of immunity, females who are pregnant should delay immunization until after pregnancy.  Pneumococcal 13-valent conjugate (PCV13) vaccine.** / Consult your health care provider.  Pneumococcal polysaccharide (PPSV23) vaccine.** / 1 to 2 doses if you smoke cigarettes or if you have certain conditions.  Meningococcal vaccine.** / Consult your health care provider.  Hepatitis A vaccine.** / Consult your health care provider.  Hepatitis B vaccine.** / Consult your health care provider.  Haemophilus influenzae type b (Hib) vaccine.** / Consult your health care provider. Ages 11 years and over  Blood pressure check.** / Every 1 to 2 years.  Lipid and cholesterol check.** / Every 5 years beginning at age 36 years.  Lung  cancer screening. / Every year if you are aged 73-80 years and have a 30-pack-year history of smoking and currently smoke or have quit within the past 15 years. Yearly screening is stopped once you have quit smoking for at least 15 years or develop a health problem that would prevent you from having lung cancer treatment.  Clinical breast exam.** / Every year after age 29 years.  BRCA-related cancer risk assessment.** / For women who have family members with a BRCA-related cancer (breast, ovarian, tubal, or peritoneal cancers).  Mammogram.** / Every year beginning at age 18 years and continuing for as long as you are in good health. Consult with your health care provider.  Pap test.** / Every 3 years starting at age 7 years through age 32 or 58 years with 3 consecutive normal Pap tests. Testing can be stopped between 65 and 70 years with 3 consecutive normal Pap tests and no abnormal Pap or HPV tests in the past 10 years.  HPV screening.** / Every 3 years from ages 33 years through ages 61 or 77 years with a history of 3 consecutive normal Pap tests. Testing can be stopped between 65 and 70 years with 3 consecutive normal Pap tests and no abnormal Pap or HPV tests in the past 10 years.  Fecal occult blood test (FOBT) of stool. / Every year beginning at age 27 years and continuing until age 34 years. You may not need to do this test if you get a colonoscopy every 10 years.  Flexible sigmoidoscopy or colonoscopy.** / Every 5 years for a flexible sigmoidoscopy or every 10 years for a colonoscopy beginning at age 60 years and continuing until age 59 years.  Hepatitis C blood test.** / For all people born from [birth year] through 1967 and any individual with known risks for hepatitis C.  Osteoporosis screening.** / A one-time screening for women ages 53 years and over and women at risk for fractures or osteoporosis.  Skin self-exam. / Monthly.  Influenza vaccine. / Every year.  Tetanus, diphtheria, and  acellular pertussis (Tdap/Td) vaccine.** / 1 dose of Td every 10 years.  Varicella vaccine.** / Consult your health care provider.  Zoster vaccine.** / 1 dose for adults aged 38 years or older.  Pneumococcal 13-valent conjugate (PCV13) vaccine.** / Consult your health care provider.  Pneumococcal polysaccharide (PPSV23) vaccine.** / 1 dose for all adults aged 8 years and older.  Meningococcal vaccine.** / Consult your health care provider.  Hepatitis A vaccine.** / Consult your health care provider.  Hepatitis B vaccine.** / Consult your health care provider.  Haemophilus influenzae type b (Hib) vaccine.** / Consult your health care provider. ** Family history and personal history of risk and conditions may change your health care provider's  recommendations. Document Released: 11/21/2001 Document Revised: 02/09/2014 Document Reviewed: 02/20/2011 [facility] Patient Information 2015 [place], [place]. This information is not intended to replace advice given to you by your health care provider. Make sure you discuss any questions you have with your health care provider.

## 2014-10-22 NOTE — Assessment & Plan Note (Signed)
Her BP is well controlled Her lytes and renal function are normal 

## 2014-10-22 NOTE — Progress Notes (Signed)
Pre visit review using our clinic review tool, if applicable. No additional management support is needed unless otherwise documented below in the visit note. 

## 2014-10-22 NOTE — Progress Notes (Signed)
   Subjective:    Patient ID: Sydney Avila, female    DOB: 04/10/1946, 69 y.o.   MRN: [id number]  Hypertension This is a chronic problem. The current episode started more than 1 year ago. The problem has been gradually improving since onset. The problem is controlled. Associated symptoms include anxiety. Pertinent negatives include no blurred vision, chest pain, headaches, malaise/fatigue, neck pain, orthopnea, palpitations, peripheral edema, PND, shortness of breath or sweats. There are no associated agents to hypertension. Past treatments include ACE inhibitors. The current treatment provides significant improvement. There are no compliance problems.       Review of Systems  Constitutional: Negative for fever, chills, malaise/fatigue, diaphoresis, appetite change and fatigue.  HENT: Negative.   Eyes: Negative.  Negative for blurred vision.  Respiratory: Negative.  Negative for cough, choking, chest tightness, shortness of breath and stridor.   Cardiovascular: Negative.  Negative for chest pain, palpitations, orthopnea and PND.  Gastrointestinal: Negative.  Negative for nausea, vomiting, abdominal pain, diarrhea, constipation and blood in stool.  Endocrine: Negative.  Negative for polydipsia, polyphagia and polyuria.  Genitourinary: Negative.  Negative for dysuria, urgency, hematuria, flank pain, decreased urine volume, difficulty urinating and dyspareunia.  Musculoskeletal: Negative.  Negative for neck pain.  Skin: Negative.   Allergic/Immunologic: Negative.   Neurological: Negative.  Negative for headaches.  Hematological: Negative.  Negative for adenopathy. Does not bruise/bleed easily.  Psychiatric/Behavioral: Negative.        Objective:   Physical Exam  Constitutional: She is oriented to person, place, and time. She appears well-developed and well-nourished. No distress.  HENT:  Head: Normocephalic and atraumatic.  Mouth/Throat: Oropharynx is clear and moist. No oropharyngeal  exudate.  Eyes: Conjunctivae are normal. Right eye exhibits no discharge. Left eye exhibits no discharge. No scleral icterus.  Neck: Normal range of motion. Neck supple. No JVD present. No tracheal deviation present. No thyromegaly present.  Cardiovascular: Normal rate, regular rhythm, normal heart sounds and intact distal pulses.  Exam reveals no gallop and no friction rub.   No murmur heard. Pulmonary/Chest: Effort normal and breath sounds normal. No stridor. No respiratory distress. She has no wheezes. She has no rales. She exhibits no tenderness.  Abdominal: Soft. Bowel sounds are normal. She exhibits no distension and no mass. There is no tenderness. There is no rebound and no guarding.  Musculoskeletal: Normal range of motion. She exhibits no edema or tenderness.  Lymphadenopathy:    She has no cervical adenopathy.  Neurological: She is oriented to person, place, and time.  Skin: Skin is warm and dry. No rash noted. She is not diaphoretic. No erythema. No pallor.  Psychiatric: She has a normal mood and affect. Her behavior is normal. Judgment and thought content normal.  Vitals reviewed.     Lab Results  Component Value Date   WBC 5.7 10/27/2013   HGB 12.0 10/27/2013   HCT 36 10/27/2013   PLT 176 10/27/2013   GLUCOSE 132* 02/25/2014   CHOL 107 10/27/2013   TRIG 179* 10/27/2013   HDL 41 10/27/2013   LDLCALC 30 10/27/2013   ALT 21 10/27/2013   AST 18 10/27/2013   NA 138 02/25/2014   K 4.1 02/25/2014   CL 103 02/25/2014   CREATININE 1.0 02/25/2014   BUN 21 02/25/2014   CO2 27 02/25/2014   HGBA1C 6.8* 10/20/2014      Assessment & Plan:

## 2014-10-22 NOTE — Assessment & Plan Note (Signed)
She has achieved her LDL goal Will cont simvastatin

## 2014-10-22 NOTE — Assessment & Plan Note (Signed)

## 2014-10-22 NOTE — Assessment & Plan Note (Signed)
Her blood sugars have improved She was congratulated on the wt loss She will cont to see ENDO

## 2014-10-23 ENCOUNTER — Other Ambulatory Visit: Payer: Self-pay | Admitting: *Deleted

## 2014-10-23 MED ORDER — EMPAGLIFLOZIN 25 MG PO TABS: 25.0000 mg | ORAL_TABLET | Freq: Every day | ORAL | Status: DC

## 2014-11-17 ENCOUNTER — Ambulatory Visit (HOSPITAL_BASED_OUTPATIENT_CLINIC_OR_DEPARTMENT_OTHER): Payer: Managed Care, Other (non HMO)

## 2014-11-24 ENCOUNTER — Ambulatory Visit (HOSPITAL_BASED_OUTPATIENT_CLINIC_OR_DEPARTMENT_OTHER)
Admission: RE | Admit: 2014-11-24 | Discharge: 2014-11-24 | Disposition: A | Discharge status: Home / Self Care | Payer: Managed Care, Other (non HMO) | Source: Ambulatory Visit | Attending: Orthopedic Surgery | Admitting: Orthopedic Surgery

## 2014-11-24 DIAGNOSIS — Z1231 Encounter for screening mammogram for malignant neoplasm of breast: Secondary | ICD-10-CM | POA: Insufficient documentation

## 2014-12-30 ENCOUNTER — Other Ambulatory Visit: Payer: Self-pay | Admitting: Internal Medicine

## 2014-12-31 ENCOUNTER — Telehealth: Payer: Self-pay | Admitting: Internal Medicine

## 2014-12-31 NOTE — Telephone Encounter (Signed)
Is requesting script for climara to be sent to [place].

## 2014-12-31 NOTE — Telephone Encounter (Signed)
CLIMARA 0.06 MG/24HR 12 patch 3 12/30/2014       Sig: APPLY 1 PATCH TO CLEAN DRY SKIN ONCE A WEEK     E-Prescribing Status: Receipt confirmed by pharmacy (12/30/2014 3:25 PM EDT)       Rx already approved and received by pharmacy.

## 2015-01-13 ENCOUNTER — Other Ambulatory Visit: Payer: Self-pay | Admitting: Internal Medicine

## 2015-01-14 ENCOUNTER — Other Ambulatory Visit: Payer: Self-pay | Admitting: *Deleted

## 2015-01-14 MED ORDER — EMPAGLIFLOZIN 25 MG PO TABS: ORAL_TABLET | ORAL | Status: DC

## 2015-03-19 ENCOUNTER — Telehealth: Payer: Self-pay | Admitting: Internal Medicine

## 2015-03-19 DIAGNOSIS — F411 Generalized anxiety disorder: Secondary | ICD-10-CM

## 2015-03-19 MED ORDER — ALPRAZOLAM 0.5 MG PO TABS: 0.5000 mg | ORAL_TABLET | Freq: Every evening | ORAL | Status: DC | PRN

## 2015-03-19 NOTE — Telephone Encounter (Signed)
OK to fill this prescription with additional refills x0 Sch OV w/Dr TJ Thank you!  

## 2015-03-19 NOTE — Telephone Encounter (Signed)
MD out of office pls advise.../lmb 

## 2015-03-19 NOTE — Telephone Encounter (Signed)
Called refill into [facility] had to leave on pharmacist vm left md approval.../lmb

## 2015-03-19 NOTE — Telephone Encounter (Signed)
Requesting refill on xanax. 

## 2015-03-22 ENCOUNTER — Other Ambulatory Visit: Payer: Self-pay

## 2015-03-22 DIAGNOSIS — F411 Generalized anxiety disorder: Secondary | ICD-10-CM

## 2015-03-22 LAB — HM DIABETES EYE EXAM

## 2015-03-23 MED ORDER — ALPRAZOLAM 0.5 MG PO TABS: 0.5000 mg | ORAL_TABLET | Freq: Every evening | ORAL | Status: DC | PRN

## 2015-04-08 ENCOUNTER — Encounter: Payer: Self-pay | Admitting: Internal Medicine

## 2015-04-11 ENCOUNTER — Other Ambulatory Visit: Payer: Self-pay | Admitting: Internal Medicine

## 2015-04-17 ENCOUNTER — Other Ambulatory Visit: Payer: Self-pay | Admitting: Internal Medicine

## 2015-04-19 ENCOUNTER — Other Ambulatory Visit: Payer: Self-pay | Admitting: *Deleted

## 2015-04-19 DIAGNOSIS — E785 Hyperlipidemia, unspecified: Secondary | ICD-10-CM

## 2015-04-19 DIAGNOSIS — IMO0002 Reserved for concepts with insufficient information to code with codable children: Secondary | ICD-10-CM

## 2015-04-19 DIAGNOSIS — E1165 Type 2 diabetes mellitus with hyperglycemia: Secondary | ICD-10-CM

## 2015-04-19 MED ORDER — METFORMIN HCL ER 500 MG PO TB24: ORAL_TABLET | ORAL | Status: DC

## 2015-04-19 MED ORDER — EMPAGLIFLOZIN 25 MG PO TABS: ORAL_TABLET | ORAL | Status: DC

## 2015-04-26 ENCOUNTER — Telehealth: Payer: Self-pay | Admitting: Internal Medicine

## 2015-04-26 DIAGNOSIS — I10 Essential (primary) hypertension: Secondary | ICD-10-CM

## 2015-04-26 DIAGNOSIS — E785 Hyperlipidemia, unspecified: Secondary | ICD-10-CM

## 2015-04-26 DIAGNOSIS — E119 Type 2 diabetes mellitus without complications: Secondary | ICD-10-CM

## 2015-04-26 NOTE — Telephone Encounter (Signed)
Patient states that she was taking a medication called empagliflozin (JARDIANCE) 25 MG TABS tablet [[id number]]. Her question is if she stops taking it today, how long would it take to get out of her system? She is experiencing hair loss. She also requests an A1c.

## 2015-04-26 NOTE — Telephone Encounter (Signed)
Per Epic, pt diabetic care is handled by Dr. Cruzita Lederer. Thanks

## 2015-04-26 NOTE — Telephone Encounter (Signed)
I spoke to the patient, who advised me that dr. Cruzita Lederer is no longer managing diabetes. She states that she wants Dr. Ronnald Ramp to manage her diabetes now, because the Vania Rea is the problem itself, which was RX from dr Cruzita Lederer. Please call the patient to clarify questions, as she became somewhat agitated with me. I have listed her mobile # below. Thank you.   M: [phone]

## 2015-04-27 NOTE — Telephone Encounter (Signed)
Patient states that she was taking a medication called empagliflozin (JARDIANCE) 25 MG TABS tablet [[id number]]. Her question is if she stops taking it today, how long would it take to get out of her system? She is experiencing hair loss. She also requests an A1c.

## 2015-04-27 NOTE — Telephone Encounter (Signed)
She needs to be seen.

## 2015-04-28 NOTE — Telephone Encounter (Signed)
i scheduled. The patient feels that she needs to get an A1c before she comes in to the office on 05/03/2015 because dr Ronnald Ramp will not have anything to refer to. I advised that I would be happy to bring it up to see if an A1c is necessary prior to the visit.,

## 2015-04-30 NOTE — Telephone Encounter (Signed)
Returned call to pt//LMOVM advising lab ordered.

## 2015-04-30 NOTE — Telephone Encounter (Signed)
Labs ordered.

## 2015-05-03 ENCOUNTER — Ambulatory Visit (INDEPENDENT_AMBULATORY_CARE_PROVIDER_SITE_OTHER): Payer: Managed Care, Other (non HMO) | Admitting: Internal Medicine

## 2015-05-03 ENCOUNTER — Other Ambulatory Visit (INDEPENDENT_AMBULATORY_CARE_PROVIDER_SITE_OTHER): Payer: Managed Care, Other (non HMO)

## 2015-05-03 VITALS — BP 126/72 | HR 85 | Temp 98.0°F | Resp 16 | Ht 62.0 in | Wt 170.0 lb

## 2015-05-03 DIAGNOSIS — Z23 Encounter for immunization: Secondary | ICD-10-CM

## 2015-05-03 DIAGNOSIS — E119 Type 2 diabetes mellitus without complications: Secondary | ICD-10-CM | POA: Diagnosis not present

## 2015-05-03 DIAGNOSIS — I10 Essential (primary) hypertension: Secondary | ICD-10-CM

## 2015-05-03 DIAGNOSIS — E785 Hyperlipidemia, unspecified: Secondary | ICD-10-CM

## 2015-05-03 LAB — URINALYSIS, ROUTINE W REFLEX MICROSCOPIC
Bilirubin Urine: NEGATIVE
HGB URINE DIPSTICK: NEGATIVE
Ketones, ur: NEGATIVE
Nitrite: NEGATIVE
RBC / HPF: NONE SEEN (ref 0–?)
Specific Gravity, Urine: <=1.005 — AB (ref 1.000–1.030)
TOTAL PROTEIN, URINE-UPE24: NEGATIVE
URINE GLUCOSE: NEGATIVE
UROBILINOGEN UA: 0.2 (ref 0.0–1.0)
pH: 6 (ref 5.0–8.0)

## 2015-05-03 LAB — BASIC METABOLIC PANEL
BUN: 18 mg/dL (ref 6–23)
CO2: 29 meq/L (ref 19–32)
Calcium: 9.4 mg/dL (ref 8.4–10.5)
Chloride: 102 mEq/L (ref 96–112)
Creatinine, Ser: 0.92 mg/dL (ref 0.40–1.20)
GFR: 63.88 mL/min (ref >60.00)
Glucose, Bld: 136 mg/dL — ABNORMAL HIGH (ref 70–99)
POTASSIUM: 4.1 meq/L (ref 3.5–5.1)
Sodium: 139 mEq/L (ref 135–145)

## 2015-05-03 LAB — LIPID PANEL
Cholesterol: 129 mg/dL (ref 0–200)
HDL: 44.8 mg/dL (ref >39.00)
NonHDL: 84.2
Total CHOL/HDL Ratio: 3
Triglycerides: 231 mg/dL — ABNORMAL HIGH (ref 0.0–149.0)
VLDL: 46.2 mg/dL — ABNORMAL HIGH (ref 0.0–40.0)

## 2015-05-03 LAB — HEMOGLOBIN A1C: HEMOGLOBIN A1C: 6.7 % — AB (ref 4.6–6.5)

## 2015-05-03 LAB — MICROALBUMIN / CREATININE URINE RATIO
CREATININE, U: 45.6 mg/dL
Microalb Creat Ratio: 0 mg/g (ref 0.0–30.0)
Microalb, Ur: 0 mg/dL (ref 0.0–1.9)

## 2015-05-03 LAB — LDL CHOLESTEROL, DIRECT: Direct LDL: 63 mg/dL

## 2015-05-03 MED ORDER — LINAGLIPTIN-METFORMIN HCL 2.5-850 MG PO TABS: 1.0000 | ORAL_TABLET | Freq: Two times a day (BID) | ORAL | Status: DC

## 2015-05-03 NOTE — Patient Instructions (Signed)

## 2015-05-03 NOTE — Progress Notes (Signed)
Pre visit review using our clinic review tool, if applicable. No additional management support is needed unless otherwise documented below in the visit note. 

## 2015-05-04 ENCOUNTER — Encounter: Payer: Self-pay | Admitting: Internal Medicine

## 2015-05-04 NOTE — Telephone Encounter (Signed)
Pt called in said that she would like for nurse to give her a call with her lab results.  Work number is best

## 2015-05-04 NOTE — Telephone Encounter (Signed)
-----   Message sent from Janith Lima, MD to Sydney Avila at 05/04/2015 7:10 AM -----      Your blood sugars are adequately well controlled.    The other labs are okay.    MD has sent mychart message to patient.

## 2015-05-04 NOTE — Progress Notes (Signed)
Subjective:  Patient ID: Sydney Avila, female    DOB: 03/16/1946  Age: 69 y.o. MRN: [id number]  CC: Diabetes   HPI Sydney Avila presents for a follow-up on diabetes. She has decided that Jardiance causes a rash and hair loss so she quit taking it about 4 days ago. The rash has resolved. She saw a dermatologist and is using some scalp applications for the hair loss. She wants to change her diabetes medications to [facility]. She offers no other complaints.  Outpatient Prescriptions Prior to Visit  Medication Sig Dispense Refill  . ALPRAZolam (XANAX) 0.5 MG tablet Take 1 tablet (0.5 mg total) by mouth at bedtime as needed for anxiety. 30 tablet 1  . Cholecalciferol (VITAMIN D3) 2000 UNITS TABS Take 1 tablet by mouth daily.    . cholestyramine (QUESTRAN) 4 GM/DOSE powder Take 1 packet (4 g total) by mouth daily. 378 g 12  . estradiol (CLIMARA) 0.06 MG/24HR Place 1 patch onto the skin once a week. 12 patch 3  . glucose blood (ONE TOUCH ULTRA TEST) test strip Use to test blood sugar 2 times daily as instructed. Dx code: 250.00 200 each 11  . lisinopril (PRINIVIL,ZESTRIL) 10 MG tablet Take 1 tablet (10 mg total) by mouth daily. 90 tablet 3  . simvastatin (ZOCOR) 20 MG tablet Take 1 tablet (20 mg total) by mouth every evening. 30 tablet 11  . triamcinolone cream (KENALOG) 0.5 % Apply 1 application topically 3 (three) times daily. 30 g 2  . metFORMIN (GLUCOPHAGE-XR) 500 MG 24 hr tablet TAKE 2 TABLETS (1,000 MG TOTAL) BY MOUTH 2 (TWO) TIMES DAILY WITH A MEAL. 360 tablet 0  . CLIMARA 0.06 MG/24HR APPLY 1 PATCH TO CLEAN DRY SKIN ONCE A WEEK (Patient not taking: Reported on 05/03/2015) 12 patch 3  . empagliflozin (JARDIANCE) 25 MG TABS tablet TAKE 25 MG BY MOUTH DAILY. (Patient not taking: Reported on 05/03/2015) 90 tablet 0  . empagliflozin (JARDIANCE) 25 MG TABS tablet TAKE 25 MG BY MOUTH DAILY. (Patient not taking: Reported on 05/03/2015) 90 tablet 0   No facility-administered medications prior to  visit.    ROS Review of Systems  Constitutional: Negative.  Negative for fever, chills, diaphoresis, appetite change and fatigue.  HENT: Negative.   Eyes: Negative.   Respiratory: Negative.  Negative for cough, choking, chest tightness, shortness of breath and stridor.   Cardiovascular: Negative.  Negative for chest pain, palpitations and leg swelling.  Gastrointestinal: Negative.  Negative for nausea, vomiting, abdominal pain, diarrhea, constipation and blood in stool.  Endocrine: Positive for polyuria. Negative for polydipsia and polyphagia.  Genitourinary: Negative.  Negative for dysuria, urgency, hematuria and difficulty urinating.  Musculoskeletal: Negative for myalgias, back pain, joint swelling and arthralgias.  Skin: Negative.   Allergic/Immunologic: Negative.   Neurological: Negative.   Hematological: Negative.  Negative for adenopathy. Does not bruise/bleed easily.  Psychiatric/Behavioral: Negative.     Objective:  BP 126/72 mmHg  Pulse 85  Temp(Src) 98 F (36.7 C) (Oral)  Resp 16  Ht 5\' 2"  (1.575 m)  Wt 170 lb (77.111 kg)  BMI 31.09 kg/m2  SpO2 96%  BP Readings from Last 3 Encounters:  05/03/15 126/72  10/22/14 114/62  10/22/14 128/80    Wt Readings from Last 3 Encounters:  05/03/15 170 lb (77.111 kg)  10/22/14 164 lb (74.39 kg)  10/22/14 164 lb (74.39 kg)    Physical Exam  Constitutional: She is oriented to person, place, and time. No distress.  HENT:  Mouth/Throat:  Oropharynx is clear and moist. No oropharyngeal exudate.  Eyes: Conjunctivae are normal. Right eye exhibits no discharge. Left eye exhibits no discharge. No scleral icterus.  Neck: Normal range of motion. Neck supple. No JVD present. No tracheal deviation present. No thyromegaly present.  Cardiovascular: Normal rate, regular rhythm, normal heart sounds and intact distal pulses.  Exam reveals no gallop and no friction rub.   No murmur heard. Pulmonary/Chest: Effort normal and breath sounds  normal. No stridor. No respiratory distress. She has no wheezes. She has no rales. She exhibits no tenderness.  Abdominal: Soft. Bowel sounds are normal. She exhibits no distension and no mass. There is no tenderness. There is no rebound and no guarding.  Musculoskeletal: Normal range of motion. She exhibits no edema or tenderness.  Lymphadenopathy:    She has no cervical adenopathy.  Neurological: She is oriented to person, place, and time.  Skin: Skin is warm and dry. No rash noted. She is not diaphoretic. No erythema. No pallor.  Psychiatric: She has a normal mood and affect. Her behavior is normal. Judgment and thought content normal.    Lab Results  Component Value Date   WBC 7.8 10/22/2014   HGB 13.7 10/22/2014   HCT 41.9 10/22/2014   PLT 248.0 10/22/2014   GLUCOSE 136* 05/03/2015   CHOL 129 05/03/2015   TRIG 231.0* 05/03/2015   HDL 44.80 05/03/2015   LDLDIRECT 63.0 05/03/2015   LDLCALC 30 10/27/2013   ALT 12 10/22/2014   AST 13 10/22/2014   NA 139 05/03/2015   K 4.1 05/03/2015   CL 102 05/03/2015   CREATININE 0.92 05/03/2015   BUN 18 05/03/2015   CO2 29 05/03/2015   TSH 3.03 10/22/2014   HGBA1C 6.7* 05/03/2015   MICROALBUR 0.0 05/03/2015    Mm Digital Screening Bilateral  11/25/2014   CLINICAL DATA:  Screening.  EXAM: DIGITAL SCREENING BILATERAL MAMMOGRAM WITH CAD  COMPARISON:  Previous exam(s).  ACR Breast Density Category c: The breast tissue is heterogeneously dense, which may obscure small masses.  FINDINGS: There are no findings suspicious for malignancy. Images were processed with CAD.  IMPRESSION: No mammographic evidence of malignancy. A result letter of this screening mammogram will be mailed directly to the patient.  RECOMMENDATION: Screening mammogram in one year. (Code:[id number]-B-01Y)  BI-RADS CATEGORY  1: Negative.   Electronically Signed   By: Lajean Manes M.D.   On: 11/25/2014 12:00    Assessment & Plan:   Sydney Avila was seen today for diabetes.  Diagnoses and  all orders for this visit:  Diabetes mellitus type II, controlled- the A1c shows that her blood sugars are relatively well controlled, her renal function is stable, will change to gently do at at her request. Orders: -     Microalbumin / creatinine urine ratio; Future -     Urinalysis, Routine w reflex microscopic (not at [facility]); Future -     Linagliptin-Metformin HCl (JENTADUETO) 2.5-850 MG TABS; Take 1 tablet by mouth 2 (two) times daily.  Essential hypertension, benign- her blood pressure is well controlled, lites and renal function are stable. Orders: -     Microalbumin / creatinine urine ratio; Future -     Urinalysis, Routine w reflex microscopic (not at [facility]); Future  Need for prophylactic vaccination against Streptococcus pneumoniae (pneumococcus) Orders: -     Pneumococcal conjugate vaccine 13-valent IM   I have discontinued Sydney Avila's CLIMARA, empagliflozin, empagliflozin, and metFORMIN. I am also having her start on Linagliptin-Metformin HCl. Additionally, I am having  her maintain her cholestyramine, triamcinolone cream, Vitamin D3, glucose blood, simvastatin, estradiol, lisinopril, and ALPRAZolam.  Meds ordered this encounter  Medications  . Linagliptin-Metformin HCl (JENTADUETO) 2.5-850 MG TABS    Sig: Take 1 tablet by mouth 2 (two) times daily.    Dispense:  180 tablet    Refill:  1     Follow-up: Return in about 4 months (around 09/03/2015).  [name], MD

## 2015-06-09 ENCOUNTER — Other Ambulatory Visit: Payer: Self-pay | Admitting: Internal Medicine

## 2015-06-18 ENCOUNTER — Other Ambulatory Visit: Payer: Self-pay | Admitting: *Deleted

## 2015-06-18 DIAGNOSIS — F411 Generalized anxiety disorder: Secondary | ICD-10-CM

## 2015-06-18 MED ORDER — ALPRAZOLAM 0.5 MG PO TABS: 0.5000 mg | ORAL_TABLET | Freq: Every evening | ORAL | Status: DC | PRN

## 2015-06-18 NOTE — Telephone Encounter (Signed)
Faxed script back to [facility].../lmb  

## 2015-06-18 NOTE — Telephone Encounter (Signed)
Receive call from pharmacy pt is requesting refills on her alprazolam. They stated they sent request several days ago pt is getting irate. Inform pharmacist per chart we never receive request. Must send them electronically & not fax. Dr. Ronnald Ramp is not tin the office today, Is it ok to refill her alprazolam.../lmb

## 2015-06-28 ENCOUNTER — Ambulatory Visit (INDEPENDENT_AMBULATORY_CARE_PROVIDER_SITE_OTHER)
Admission: RE | Admit: 2015-06-28 | Discharge: 2015-06-28 | Disposition: A | Discharge status: Home / Self Care | Payer: Managed Care, Other (non HMO) | Source: Ambulatory Visit | Attending: Internal Medicine | Admitting: Internal Medicine

## 2015-06-28 ENCOUNTER — Encounter: Payer: Self-pay | Admitting: Internal Medicine

## 2015-06-28 ENCOUNTER — Ambulatory Visit (INDEPENDENT_AMBULATORY_CARE_PROVIDER_SITE_OTHER): Payer: Managed Care, Other (non HMO) | Admitting: Internal Medicine

## 2015-06-28 VITALS — BP 142/88 | HR 85 | Temp 97.8°F | Resp 16 | Ht 62.0 in | Wt 174.0 lb

## 2015-06-28 DIAGNOSIS — M542 Cervicalgia: Secondary | ICD-10-CM

## 2015-06-28 DIAGNOSIS — I10 Essential (primary) hypertension: Secondary | ICD-10-CM

## 2015-06-28 DIAGNOSIS — E119 Type 2 diabetes mellitus without complications: Secondary | ICD-10-CM

## 2015-06-28 DIAGNOSIS — S199XXA Unspecified injury of neck, initial encounter: Secondary | ICD-10-CM

## 2015-06-28 DIAGNOSIS — M5030 Other cervical disc degeneration, unspecified cervical region: Secondary | ICD-10-CM | POA: Insufficient documentation

## 2015-06-28 MED ORDER — HYDROCODONE-ACETAMINOPHEN 5-325 MG PO TABS: 1.0000 | ORAL_TABLET | Freq: Four times a day (QID) | ORAL | Status: DC | PRN

## 2015-06-28 MED ORDER — SAXAGLIPTIN-METFORMIN ER 2.5-1000 MG PO TB24: 2.0000 | ORAL_TABLET | Freq: Every day | ORAL | Status: DC

## 2015-06-28 NOTE — Progress Notes (Signed)
Pre visit review using our clinic review tool, if applicable. No additional management support is needed unless otherwise documented below in the visit note.,j

## 2015-06-28 NOTE — Patient Instructions (Signed)

## 2015-06-28 NOTE — Progress Notes (Signed)
Subjective:  Patient ID: Sydney Avila, female    DOB: [birth year]  Age: 69 y.o. MRN: [id number]  CC: Diabetes; Hypertension; and Neck Pain   HPI Sydney Avila presents for follow-up and she complains of left-sided neck pain. She tells me that 5 days ago she was lifting some tile and felt soreness develop on the left side of her neck. She has had persistent soreness. She has been taking Aleve with some relief from the symptoms but she has some discomfort at night that keeps her awake. The neck pain does not radiate into her arms and she denies experiencing any numbness, weakness, tingling in her arms or legs. She also developed a subconjunctival hemorrhage in her left eye and she saw an eye doctor Gershon Crane) about this 2 days ago. The left eye is healing nicely with no pain, blurred vision or swelling. She also complains that her blood sugars are not well-controlled on Jentadueto. She is concerned that it only has 850 mg of metformin and she wants something with a thousand milligrams of metformin twice a day. Her sugars have gone up into the 200-220 range.  Outpatient Prescriptions Prior to Visit  Medication Sig Dispense Refill  . ALPRAZolam (XANAX) 0.5 MG tablet Take 1 tablet (0.5 mg total) by mouth at bedtime as needed for anxiety. 30 tablet 0  . Cholecalciferol (VITAMIN D3) 2000 UNITS TABS Take 1 tablet by mouth daily.    . cholestyramine (QUESTRAN) 4 GM/DOSE powder Take 1 packet (4 g total) by mouth daily. 378 g 12  . estradiol (CLIMARA) 0.06 MG/24HR Place 1 patch onto the skin once a week. 12 patch 3  . lisinopril (PRINIVIL,ZESTRIL) 10 MG tablet Take 1 tablet (10 mg total) by mouth daily. 90 tablet 3  . ONE TOUCH ULTRA TEST test strip USE TO TEST BLOOD SUGAR 2 TIMES DAILY AS INSTRUCTED. 200 each 3  . simvastatin (ZOCOR) 20 MG tablet Take 1 tablet (20 mg total) by mouth every evening. 30 tablet 11  . triamcinolone cream (KENALOG) 0.5 % Apply 1 application topically 3 (three) times daily. 30 g 2   . Linagliptin-Metformin HCl (JENTADUETO) 2.5-850 MG TABS Take 1 tablet by mouth 2 (two) times daily. 180 tablet 1   No facility-administered medications prior to visit.    ROS Review of Systems  Constitutional: Negative.  Negative for fever, chills, diaphoresis, appetite change and fatigue.  HENT: Negative.   Eyes: Positive for redness. Negative for photophobia, pain and visual disturbance.  Respiratory: Negative.  Negative for cough, choking, chest tightness, shortness of breath and stridor.   Cardiovascular: Negative.  Negative for chest pain, palpitations and leg swelling.  Gastrointestinal: Negative.  Negative for nausea, vomiting, abdominal pain, diarrhea and constipation.  Endocrine: Negative.  Negative for polydipsia, polyphagia and polyuria.  Genitourinary: Negative.   Musculoskeletal: Positive for neck pain and neck stiffness. Negative for back pain and arthralgias.  Skin: Negative.   Allergic/Immunologic: Negative.   Neurological: Negative.  Negative for dizziness, tremors, weakness, light-headedness, numbness and headaches.  Hematological: Negative.  Negative for adenopathy. Does not bruise/bleed easily.  Psychiatric/Behavioral: Negative.     Objective:  BP 142/88 mmHg  Pulse 85  Temp(Src) 97.8 F (36.6 C) (Oral)  Resp 16  Ht 5\' 2"  (1.575 m)  Wt 174 lb (78.926 kg)  BMI 31.82 kg/m2  SpO2 96%  BP Readings from Last 3 Encounters:  06/28/15 142/88  05/03/15 126/72  10/22/14 114/62    Wt Readings from Last 3 Encounters:  06/28/15 174  lb (78.926 kg)  05/03/15 170 lb (77.111 kg)  10/22/14 164 lb (74.39 kg)    Physical Exam  Constitutional: She is oriented to person, place, and time. No distress.  HENT:  Head: Normocephalic and atraumatic.  Mouth/Throat: Oropharynx is clear and moist. No oropharyngeal exudate.  Eyes: EOM are normal. Pupils are equal, round, and reactive to light. Right eye exhibits no discharge. Right conjunctiva is not injected. Right  conjunctiva has no hemorrhage. Left conjunctiva is not injected. Left conjunctiva has a hemorrhage. No scleral icterus.    Neck: Normal range of motion. Neck supple. No JVD present. No tracheal deviation present. No thyromegaly present.  Cardiovascular: Normal rate, regular rhythm, normal heart sounds and intact distal pulses.  Exam reveals no gallop and no friction rub.   No murmur heard. Pulmonary/Chest: Effort normal and breath sounds normal. No stridor. No respiratory distress. She has no wheezes. She has no rales. She exhibits no tenderness.  Abdominal: Soft. Bowel sounds are normal. She exhibits no distension and no mass. There is no tenderness. There is no rebound and no guarding.  Musculoskeletal: Normal range of motion. She exhibits no edema or tenderness.       Cervical back: Normal. She exhibits normal range of motion, no tenderness, no bony tenderness, no swelling, no edema, no deformity, no laceration, no pain and normal pulse.  Lymphadenopathy:    She has no cervical adenopathy.  Neurological: She is oriented to person, place, and time.  Skin: Skin is warm and dry. No rash noted. She is not diaphoretic. No erythema. No pallor.    Lab Results  Component Value Date   WBC 7.8 10/22/2014   HGB 13.7 10/22/2014   HCT 41.9 10/22/2014   PLT 248.0 10/22/2014   GLUCOSE 136* 05/03/2015   CHOL 129 05/03/2015   TRIG 231.0* 05/03/2015   HDL 44.80 05/03/2015   LDLDIRECT 63.0 05/03/2015   LDLCALC 30 10/27/2013   ALT 12 10/22/2014   AST 13 10/22/2014   NA 139 05/03/2015   K 4.1 05/03/2015   CL 102 05/03/2015   CREATININE 0.92 05/03/2015   BUN 18 05/03/2015   CO2 29 05/03/2015   TSH 3.03 10/22/2014   HGBA1C 6.7* 05/03/2015   MICROALBUR 0.0 05/03/2015    Mm Digital Screening Bilateral  11/25/2014   CLINICAL DATA:  Screening.  EXAM: DIGITAL SCREENING BILATERAL MAMMOGRAM WITH CAD  COMPARISON:  Previous exam(s).  ACR Breast Density Category c: The breast tissue is heterogeneously  dense, which may obscure small masses.  FINDINGS: There are no findings suspicious for malignancy. Images were processed with CAD.  IMPRESSION: No mammographic evidence of malignancy. A result letter of this screening mammogram will be mailed directly to the patient.  RECOMMENDATION: Screening mammogram in one year. (Code:[id number])  BI-RADS CATEGORY  1: Negative.   Electronically Signed   By: Lajean Manes M.D.   On: 11/25/2014 12:00    Assessment & Plan:   Alinda was seen today for diabetes, hypertension and neck pain.  Diagnoses and all orders for this visit:  Diabetes mellitus type II, controlled- will change to get a higher dose of metformin -     Saxagliptin-Metformin 2.02-999 MG TB24; Take 2 tablets by mouth daily.  Essential hypertension, benign- her BP is well controlled  Neck pain on left side- she has pain but no s/s of radiculopathy, will check a plain film for fracture, she will cont aleve and will also try norco for pain -     DG Cervical Spine  Complete; Future -     HYDROcodone-acetaminophen (NORCO/VICODIN) 5-325 MG per tablet; Take 1 tablet by mouth every 6 (six) hours as needed for moderate pain.  Neck injury, initial encounter- her exam is ok, will get a plain film to check for fracture -     DG Cervical Spine Complete; Future   I have discontinued Ms. Shimmel's Linagliptin-Metformin HCl. I am also having her start on Saxagliptin-Metformin and HYDROcodone-acetaminophen. Additionally, I am having her maintain her cholestyramine, triamcinolone cream, Vitamin D3, simvastatin, estradiol, lisinopril, ONE TOUCH ULTRA TEST, and ALPRAZolam.  Meds ordered this encounter  Medications  . Saxagliptin-Metformin 2.02-999 MG TB24    Sig: Take 2 tablets by mouth daily.    Dispense:  180 tablet    Refill:  1  . HYDROcodone-acetaminophen (NORCO/VICODIN) 5-325 MG per tablet    Sig: Take 1 tablet by mouth every 6 (six) hours as needed for moderate pain.    Dispense:  35 tablet    Refill:   0     Follow-up: Return in about 3 weeks (around 07/19/2015).  [name], MD

## 2015-06-29 ENCOUNTER — Encounter: Payer: Self-pay | Admitting: Internal Medicine

## 2015-06-29 ENCOUNTER — Telehealth: Payer: Self-pay | Admitting: Internal Medicine

## 2015-06-29 NOTE — Telephone Encounter (Signed)
PLease advise

## 2015-06-29 NOTE — Telephone Encounter (Signed)
Check MyChart

## 2015-06-29 NOTE — Telephone Encounter (Signed)
Please call patient at [phone] with results from x-rays she had done yesterday

## 2015-06-30 ENCOUNTER — Other Ambulatory Visit: Payer: Self-pay | Admitting: Internal Medicine

## 2015-06-30 DIAGNOSIS — E119 Type 2 diabetes mellitus without complications: Secondary | ICD-10-CM

## 2015-06-30 DIAGNOSIS — M5030 Other cervical disc degeneration, unspecified cervical region: Secondary | ICD-10-CM

## 2015-06-30 MED ORDER — TIZANIDINE HCL 2 MG PO CAPS: 2.0000 mg | ORAL_CAPSULE | Freq: Three times a day (TID) | ORAL | Status: DC | PRN

## 2015-06-30 MED ORDER — SITAGLIP PHOS-METFORMIN HCL ER 50-1000 MG PO TB24: 2.0000 | ORAL_TABLET | Freq: Every day | ORAL | Status: DC

## 2015-06-30 NOTE — Telephone Encounter (Signed)
Changed to janumet Rx for muscle relaxer sent to her pharmacy

## 2015-06-30 NOTE — Telephone Encounter (Signed)
Spoke to pt she just picked up pain meds and will take it during the night. She cannot tolerate it during the day. Pt is wanting to know if she is better off using a muscle relaxer vs taking norco at night? She feels like the norco is too strong and lasts into the AM. Also Denial came back for the Kombiglyze XR pharmacy alternatives: Janumet XR or JENTADUETO

## 2015-07-01 NOTE — Telephone Encounter (Signed)
Pt informed of switch

## 2015-07-17 ENCOUNTER — Other Ambulatory Visit: Payer: Self-pay | Admitting: Internal Medicine

## 2015-07-31 ENCOUNTER — Other Ambulatory Visit: Payer: Self-pay | Admitting: Internal Medicine

## 2015-09-08 ENCOUNTER — Other Ambulatory Visit: Payer: Self-pay | Admitting: Internal Medicine

## 2015-09-30 ENCOUNTER — Other Ambulatory Visit: Payer: Self-pay

## 2015-09-30 NOTE — Telephone Encounter (Signed)
Sig stated cycle fill medication. Ok to fill?

## 2015-10-01 MED ORDER — CHOLESTYRAMINE 4 GM/DOSE PO POWD: 4.0000 g | Freq: Every day | ORAL | Status: DC

## 2015-10-25 ENCOUNTER — Telehealth: Payer: Self-pay | Admitting: Internal Medicine

## 2015-10-25 NOTE — Telephone Encounter (Signed)
Pt was informed ins might not cover. She will wait until after appt

## 2015-10-25 NOTE — Telephone Encounter (Signed)
Pt has a f/u appointment on Weds and she is having concerns about medication she's taking.  She is home today and was hoping to come in to the lab today to get her A1c checked.  Please advise.

## 2015-10-27 ENCOUNTER — Ambulatory Visit (INDEPENDENT_AMBULATORY_CARE_PROVIDER_SITE_OTHER): Payer: BLUE CROSS/BLUE SHIELD | Admitting: Internal Medicine

## 2015-10-27 ENCOUNTER — Encounter: Payer: Self-pay | Admitting: Internal Medicine

## 2015-10-27 ENCOUNTER — Other Ambulatory Visit (INDEPENDENT_AMBULATORY_CARE_PROVIDER_SITE_OTHER): Payer: BLUE CROSS/BLUE SHIELD

## 2015-10-27 VITALS — BP 138/80 | HR 83 | Temp 97.9°F | Resp 16 | Ht 62.0 in | Wt 177.0 lb

## 2015-10-27 DIAGNOSIS — E785 Hyperlipidemia, unspecified: Secondary | ICD-10-CM

## 2015-10-27 DIAGNOSIS — E119 Type 2 diabetes mellitus without complications: Secondary | ICD-10-CM | POA: Diagnosis not present

## 2015-10-27 DIAGNOSIS — I10 Essential (primary) hypertension: Secondary | ICD-10-CM | POA: Diagnosis not present

## 2015-10-27 DIAGNOSIS — Z1159 Encounter for screening for other viral diseases: Secondary | ICD-10-CM

## 2015-10-27 LAB — LIPID PANEL
CHOL/HDL RATIO: 3
CHOLESTEROL: 126 mg/dL (ref 0–200)
HDL: 42.9 mg/dL (ref >39.00)
NonHDL: 83.59
Triglycerides: 260 mg/dL — ABNORMAL HIGH (ref 0.0–149.0)
VLDL: 52 mg/dL — AB (ref 0.0–40.0)

## 2015-10-27 LAB — BASIC METABOLIC PANEL
BUN: 16 mg/dL (ref 6–23)
CALCIUM: 9.1 mg/dL (ref 8.4–10.5)
CO2: 29 meq/L (ref 19–32)
CREATININE: 0.96 mg/dL (ref 0.40–1.20)
Chloride: 104 mEq/L (ref 96–112)
GFR: 60.74 mL/min (ref >60.00)
GLUCOSE: 128 mg/dL — AB (ref 70–99)
Potassium: 4 mEq/L (ref 3.5–5.1)
SODIUM: 141 meq/L (ref 135–145)

## 2015-10-27 LAB — HEMOGLOBIN A1C: HEMOGLOBIN A1C: 6.8 % — AB (ref 4.6–6.5)

## 2015-10-27 LAB — TSH: TSH: 3.04 u[IU]/mL (ref 0.35–4.50)

## 2015-10-27 LAB — LDL CHOLESTEROL, DIRECT: Direct LDL: 54 mg/dL

## 2015-10-27 NOTE — Patient Instructions (Signed)

## 2015-10-27 NOTE — Progress Notes (Signed)
Subjective:  Patient ID: Sydney Avila, female    DOB: 1945-11-08  Age: 70 y.o. MRN: [id number]  CC: Diabetes and Hyperlipidemia   HPI Sydney Avila presents for a follow-up on diabetes and high cholesterol. She feels well and offers no complaints.  Outpatient Prescriptions Prior to Visit  Medication Sig Dispense Refill  . ALPRAZolam (XANAX) 0.5 MG tablet TAKE 1 TABLET BY MOUTH EVERY NIGHT AT BEDTIME AS NEEDED FOR ANXIETY  (NEED FOLLOW UP WITH DR Ronnald Ramp FOR REFILLS) 30 tablet 5  . Cholecalciferol (VITAMIN D3) 2000 UNITS TABS Take 1 tablet by mouth daily.    . cholestyramine (QUESTRAN) 4 GM/DOSE powder Take 1 packet (4 g total) by mouth daily. 378 g 12  . estradiol (CLIMARA) 0.06 MG/24HR Place 1 patch onto the skin once a week. 12 patch 3  . JENTADUETO 2.5-850 MG TABS TAKE 1 TABLET BY MOUTH 2 (TWO) TIMES DAILY. 180 tablet 1  . lisinopril (PRINIVIL,ZESTRIL) 10 MG tablet Take 1 tablet (10 mg total) by mouth daily. 90 tablet 3  . ONE TOUCH ULTRA TEST test strip USE TO TEST BLOOD SUGAR 2 TIMES DAILY AS INSTRUCTED. 200 each 3  . simvastatin (ZOCOR) 20 MG tablet TAKE 1 TABLET (20 MG TOTAL) BY MOUTH EVERY EVENING. 30 tablet 11  . tizanidine (ZANAFLEX) 2 MG capsule Take 1 capsule (2 mg total) by mouth 3 (three) times daily as needed for muscle spasms. 75 capsule 5  . triamcinolone cream (KENALOG) 0.5 % Apply 1 application topically 3 (three) times daily. 30 g 2  . HYDROcodone-acetaminophen (NORCO/VICODIN) 5-325 MG per tablet Take 1 tablet by mouth every 6 (six) hours as needed for moderate pain. 35 tablet 0  . SitaGLIPtin-MetFORMIN HCl (JANUMET XR) 50-1000 MG TB24 Take 2 tablets by mouth daily. 180 tablet 1   No facility-administered medications prior to visit.    ROS Review of Systems  Constitutional: Negative.  Negative for fever, chills, diaphoresis, appetite change and fatigue.  HENT: Negative.   Eyes: Negative.   Respiratory: Negative.  Negative for cough, choking, chest tightness,  shortness of breath and stridor.   Cardiovascular: Negative.  Negative for chest pain, palpitations and leg swelling.  Gastrointestinal: Negative.  Negative for abdominal pain, diarrhea, constipation and rectal pain.  Endocrine: Negative.  Negative for polydipsia, polyphagia and polyuria.  Genitourinary: Negative.  Negative for difficulty urinating.  Musculoskeletal: Negative.  Negative for myalgias, back pain, arthralgias and neck pain.  Skin: Negative.  Negative for color change and rash.  Allergic/Immunologic: Negative.   Neurological: Negative.  Negative for dizziness, weakness and numbness.  Hematological: Negative.  Negative for adenopathy. Does not bruise/bleed easily.  Psychiatric/Behavioral: Negative.     Objective:  BP 138/80 mmHg  Pulse 83  Temp(Src) 97.9 F (36.6 C) (Oral)  Resp 16  Ht 5\' 2"  (1.575 m)  Wt 177 lb (80.287 kg)  BMI 32.37 kg/m2  SpO2 95%  BP Readings from Last 3 Encounters:  10/27/15 138/80  06/28/15 142/88  05/03/15 126/72    Wt Readings from Last 3 Encounters:  10/27/15 177 lb (80.287 kg)  06/28/15 174 lb (78.926 kg)  05/03/15 170 lb (77.111 kg)    Physical Exam  Constitutional: She is oriented to person, place, and time. No distress.  HENT:  Mouth/Throat: Oropharynx is clear and moist. No oropharyngeal exudate.  Eyes: Conjunctivae are normal. Right eye exhibits no discharge. Left eye exhibits no discharge. No scleral icterus.  Neck: Normal range of motion. Neck supple. No JVD present. No tracheal  deviation present. No thyromegaly present.  Cardiovascular: Normal rate, regular rhythm, normal heart sounds and intact distal pulses.  Exam reveals no gallop and no friction rub.   No murmur heard. Pulmonary/Chest: Effort normal and breath sounds normal. No stridor. No respiratory distress. She has no wheezes. She has no rales. She exhibits no tenderness.  Abdominal: Soft. Bowel sounds are normal. She exhibits no distension and no mass. There is no  tenderness. There is no rebound and no guarding.  Musculoskeletal: Normal range of motion. She exhibits no edema or tenderness.  Lymphadenopathy:    She has no cervical adenopathy.  Neurological: She is oriented to person, place, and time.  Skin: Skin is warm and dry. No rash noted. She is not diaphoretic. No erythema. No pallor.  Vitals reviewed.   Lab Results  Component Value Date   WBC 7.8 10/22/2014   HGB 13.7 10/22/2014   HCT 41.9 10/22/2014   PLT 248.0 10/22/2014   GLUCOSE 128* 10/27/2015   CHOL 126 10/27/2015   TRIG 260.0* 10/27/2015   HDL 42.90 10/27/2015   LDLDIRECT 54.0 10/27/2015   LDLCALC 30 10/27/2013   ALT 12 10/22/2014   AST 13 10/22/2014   NA 141 10/27/2015   K 4.0 10/27/2015   CL 104 10/27/2015   CREATININE 0.96 10/27/2015   BUN 16 10/27/2015   CO2 29 10/27/2015   TSH 3.04 10/27/2015   HGBA1C 6.8* 10/27/2015   MICROALBUR 0.0 05/03/2015    Dg Cervical Spine Complete  06/29/2015  CLINICAL DATA:  Neck and shoulder pain. Initial encounter. No known injury. EXAM: CERVICAL SPINE  4+ VIEWS COMPARISON:  None. FINDINGS: Diffuse multilevel degenerative change with endplate osteophyte formation and diffuse facet hypertrophy. Prominent narrowing of the right C3-C4 and C4-C5 neuroforamen noted. Narrowing of the left C2-C3 neuroforamen present. No acute abnormality. No evidence of fracture dislocation . Pulmonary apices are clear . IMPRESSION: Severe diffuse degenerative change with multifocal neuroforaminal narrowing. No acute abnormality identified . Electronically Signed   By: Marcello Moores  Register   On: 06/29/2015 08:37    Assessment & Plan:   Kyan was seen today for diabetes and hyperlipidemia.  Diagnoses and all orders for this visit:  Controlled type 2 diabetes mellitus without complication, without long-term current use of insulin ([place])- her A1c indicates that her blood sugars are well-controlled, will continue current regimen. -     Basic metabolic panel; Future -      Hemoglobin A1c; Future -     Lipid panel; Future  Need for hepatitis C screening test -     Hepatitis C Antibody; Future  Essential hypertension, benign- her blood pressure is well-controlled, lites and renal function are stable. -     Basic metabolic panel; Future  Hyperlipidemia with target LDL less than 100- she has achieved her LDL goal is doing well on the statin. -     TSH; Future -     Lipid panel; Future   I have discontinued Ms. Stearns's HYDROcodone-acetaminophen and SitaGLIPtin-MetFORMIN HCl. I am also having her maintain her triamcinolone cream, Vitamin D3, estradiol, lisinopril, ONE TOUCH ULTRA TEST, tizanidine, ALPRAZolam, JENTADUETO, simvastatin, and cholestyramine.  No orders of the defined types were placed in this encounter.     Follow-up: Return in about 6 months (around 04/25/2016).  [name], MD

## 2015-10-27 NOTE — Progress Notes (Signed)
Pre visit review using our clinic review tool, if applicable. No additional management support is needed unless otherwise documented below in the visit note. 

## 2015-10-28 ENCOUNTER — Encounter: Payer: Self-pay | Admitting: Internal Medicine

## 2015-10-28 LAB — HEPATITIS C ANTIBODY: HCV AB: NEGATIVE

## 2015-11-12 ENCOUNTER — Other Ambulatory Visit: Payer: Self-pay | Admitting: Internal Medicine

## 2015-11-22 ENCOUNTER — Other Ambulatory Visit: Payer: Self-pay | Admitting: Internal Medicine

## 2015-12-18 ENCOUNTER — Other Ambulatory Visit: Payer: Self-pay | Admitting: Internal Medicine

## 2016-01-12 ENCOUNTER — Other Ambulatory Visit: Payer: Self-pay | Admitting: Internal Medicine

## 2016-01-12 NOTE — Telephone Encounter (Signed)
Faxed script back to [facility].../lm,b

## 2016-01-13 ENCOUNTER — Telehealth: Payer: Self-pay

## 2016-01-13 NOTE — Telephone Encounter (Signed)
This was just done yesterday? 

## 2016-01-13 NOTE — Telephone Encounter (Signed)
Please advise patient is requesting a refill of alprazolam

## 2016-02-29 ENCOUNTER — Telehealth: Payer: Self-pay | Admitting: *Deleted

## 2016-02-29 ENCOUNTER — Encounter: Payer: Self-pay | Admitting: Gynecology

## 2016-02-29 ENCOUNTER — Ambulatory Visit (INDEPENDENT_AMBULATORY_CARE_PROVIDER_SITE_OTHER): Payer: BLUE CROSS/BLUE SHIELD | Admitting: Gynecology

## 2016-02-29 VITALS — BP 124/80 | Ht 62.0 in | Wt 173.0 lb

## 2016-02-29 DIAGNOSIS — N898 Other specified noninflammatory disorders of vagina: Secondary | ICD-10-CM

## 2016-02-29 DIAGNOSIS — R350 Frequency of micturition: Secondary | ICD-10-CM

## 2016-02-29 LAB — URINALYSIS W MICROSCOPIC + REFLEX CULTURE
Bilirubin Urine: NEGATIVE
Casts: NONE SEEN [LPF]
Crystals: NONE SEEN [HPF]
GLUCOSE, UA: NEGATIVE
HGB URINE DIPSTICK: NEGATIVE
KETONES UR: NEGATIVE
Nitrite: NEGATIVE
PH: 5 (ref 5.0–8.0)
Protein, ur: NEGATIVE
RBC / HPF: NONE SEEN RBC/HPF (ref <=2)
Specific Gravity, Urine: <1.005 (ref 1.001–1.035)
Yeast: NONE SEEN [HPF]

## 2016-02-29 LAB — WET PREP FOR TRICH, YEAST, CLUE
CLUE CELLS WET PREP: NONE SEEN
TRICH WET PREP: NONE SEEN
Yeast Wet Prep HPF POC: NONE SEEN

## 2016-02-29 MED ORDER — FLUCONAZOLE 150 MG PO TABS: ORAL_TABLET | ORAL | Status: DC

## 2016-02-29 NOTE — Progress Notes (Signed)
   HPI: Patient is a 70 year old presented to the office complaining of a slight vaginal discharge urinary frequency and vulvar irritation. Patient formerly was a patient of my partner who retired her last exam with him was in 2013. Her primary care physician Dr. Ronnald Ramp has been doing her annual exams and has been prescribing her Climara weekly transdermal patch 0.06 mg which she has tried on numerous occasions to discontinue but has to return on it because of her vasomotor symptoms. She does have a history of a TAH/BSO in 1983 for fibroids. Prior to that she's always had normal Pap smears. Her last Pap smear was in 2011. Her colonoscopy is up-to-date and she is on a 10 year recall. Her mammogram is also up-to-date as well. Also her PCP has been doing her bone density studies. Patient is a type II diabetic currently on Janumet.   Patient a few days ago tried over-the-counter antifungal agent for 3 days if she feels that her symptoms haven't improved   ROS: A ROS was performed and pertinent positives and negatives are included in the history.  GENERAL: No fevers or chills. HEENT: No change in vision, no earache, sore throat or sinus congestion. NECK: No pain or stiffness. CARDIOVASCULAR: No chest pain or pressure. No palpitations. PULMONARY: No shortness of breath, cough or wheeze. GASTROINTESTINAL: No abdominal pain, nausea, vomiting or diarrhea, melena or bright red blood per rectum. GENITOURINARY: No urinary frequency, urgency, hesitancy or dysuria. MUSCULOSKELETAL: No joint or muscle pain, no back pain, no recent trauma. DERMATOLOGIC: No rash, no itching, no lesions. ENDOCRINE: No polyuria, polydipsia, no heat or cold intolerance. No recent change in weight. HEMATOLOGICAL: No anemia or easy bruising or bleeding. NEUROLOGIC: No headache, seizures, numbness, tingling or weakness. PSYCHIATRIC: No depression, no loss of interest in normal activity or change in sleep pattern.   PE: Blood pressure 124/80 Gen.  appearance well-developed well-nourished postmenopausal patient with the above-mentioned complaints and Bartholin urethra Skene glands with atrophic changes no gross lesions on inspection Vagina: Atrophic changes no gross lesions on the vaginal mucosa vaginal cuff intact Bimanual exam unremarkable Rectal exam not done   Wet prep no yeast rare WBC rare bacteria  Urinalysis few bacteria culture pending   Assessment Plan: With slight atrophic vaginitis self treated with over-the-counter antifungal agent for 3 days no yeast noted today. In the event that her pruritus there may be some yeast on her external genitalia ongoing preterm prescribed Diflucan 150 mg one by mouth with 2 refills. Patient was counseled once again the risk benefits and pros and cons of estrogen replacement therapy. Pap smear not indicated.    Greater than 50% of time was spent in counseling and coordinating care of this patient.   Time of consultation: 20    Minutes.

## 2016-02-29 NOTE — Telephone Encounter (Signed)
-----   Message from Terrance Mass, MD sent at 02/29/2016 12:03 PM EDT ----- Tell patient not to take her statin for three days when she takes her diflucan

## 2016-02-29 NOTE — Addendum Note (Signed)
Addended by: Terrance Mass on: 02/29/2016 12:03 PM   Modules accepted: Orders

## 2016-02-29 NOTE — Telephone Encounter (Signed)
Pt informed with the below note. 

## 2016-03-02 LAB — URINE CULTURE
Colony Count: NO GROWTH
Organism ID, Bacteria: NO GROWTH

## 2016-04-05 ENCOUNTER — Ambulatory Visit (INDEPENDENT_AMBULATORY_CARE_PROVIDER_SITE_OTHER): Payer: BLUE CROSS/BLUE SHIELD | Admitting: Internal Medicine

## 2016-04-05 ENCOUNTER — Encounter: Payer: Self-pay | Admitting: Internal Medicine

## 2016-04-05 ENCOUNTER — Other Ambulatory Visit (INDEPENDENT_AMBULATORY_CARE_PROVIDER_SITE_OTHER): Payer: BLUE CROSS/BLUE SHIELD

## 2016-04-05 ENCOUNTER — Telehealth: Payer: Self-pay

## 2016-04-05 VITALS — BP 122/78 | HR 67 | Temp 98.0°F | Resp 16 | Ht 62.0 in | Wt 175.0 lb

## 2016-04-05 DIAGNOSIS — E118 Type 2 diabetes mellitus with unspecified complications: Secondary | ICD-10-CM | POA: Diagnosis not present

## 2016-04-05 DIAGNOSIS — K909 Intestinal malabsorption, unspecified: Secondary | ICD-10-CM

## 2016-04-05 DIAGNOSIS — K9089 Other intestinal malabsorption: Secondary | ICD-10-CM

## 2016-04-05 DIAGNOSIS — I10 Essential (primary) hypertension: Secondary | ICD-10-CM

## 2016-04-05 LAB — BASIC METABOLIC PANEL
BUN: 14 mg/dL (ref 6–23)
CALCIUM: 8.8 mg/dL (ref 8.4–10.5)
CO2: 28 meq/L (ref 19–32)
CREATININE: 0.86 mg/dL (ref 0.40–1.20)
Chloride: 103 mEq/L (ref 96–112)
GFR: 68.87 mL/min (ref >60.00)
Glucose, Bld: 211 mg/dL — ABNORMAL HIGH (ref 70–99)
Potassium: 4.3 mEq/L (ref 3.5–5.1)
SODIUM: 139 meq/L (ref 135–145)

## 2016-04-05 LAB — MICROALBUMIN / CREATININE URINE RATIO
Creatinine,U: 104.2 mg/dL
MICROALB UR: 0.9 mg/dL (ref 0.0–1.9)
Microalb Creat Ratio: 0.9 mg/g (ref 0.0–30.0)

## 2016-04-05 LAB — POCT GLYCOSYLATED HEMOGLOBIN (HGB A1C): Hemoglobin A1C: 7.2

## 2016-04-05 LAB — URINALYSIS, ROUTINE W REFLEX MICROSCOPIC
Bilirubin Urine: NEGATIVE
KETONES UR: NEGATIVE
Nitrite: NEGATIVE
Specific Gravity, Urine: 1.025 (ref 1.000–1.030)
Total Protein, Urine: NEGATIVE
URINE GLUCOSE: NEGATIVE
UROBILINOGEN UA: 0.2 (ref 0.0–1.0)
pH: 5.5 (ref 5.0–8.0)

## 2016-04-05 MED ORDER — CHOLESTYRAMINE 4 GM/DOSE PO POWD: 4.0000 g | Freq: Two times a day (BID) | ORAL | Status: DC

## 2016-04-05 NOTE — Telephone Encounter (Signed)
FYI!! 6.13.16  Was her last eye app. Her next app is 8.14.17.

## 2016-04-05 NOTE — Progress Notes (Signed)
Pre visit review using our clinic review tool, if applicable. No additional management support is needed unless otherwise documented below in the visit note. 

## 2016-04-05 NOTE — Telephone Encounter (Signed)
Noted thanks °

## 2016-04-05 NOTE — Patient Instructions (Signed)

## 2016-04-05 NOTE — Progress Notes (Signed)
Subjective:  Patient ID: Sydney Avila, female    DOB: 06/30/1946  Age: 70 y.o. MRN: [id number]  CC: Hypertension and Diabetes   HPI Sydney Avila presents for follow-up on hypertension and diabetes. She complains that she has had diarrhea for about 30 years. It started after she had her gallbladder removed. She has an occasional loose and urgent stool. She tells me that the current dose of cholestyramine has helped some but she still has some trouble with diarrhea in the morning when she wakes up. She also occasionally doses with Imodium. She otherwise feels well and offers no other complaints.  Outpatient Prescriptions Prior to Visit  Medication Sig Dispense Refill  . ALPRAZolam (XANAX) 0.5 MG tablet TAKE 1 TABLET BY MOUTH EVERY NIGHT AT BEDTIME AS NEEDED FOR ANXIETY  (NEED FOLLOW UP WITH DR Ronnald Ramp FOR REFILLS) 30 tablet 5  . Cholecalciferol (VITAMIN D3) 2000 UNITS TABS Take 1 tablet by mouth daily.    Marland Kitchen CLIMARA 0.06 MG/24HR APPLY 1 PATCH TRANSDERMALLYTO CLEAN DRY SKIN ONCE A   WEEK 12 patch 3  . fluconazole (DIFLUCAN) 150 MG tablet Take one every nother day for 3 days 3 tablet 3  . JANUMET XR 50-1000 MG TB24 TAKE 2 TABLETS BY MOUTH DAILY. 180 tablet 3  . lisinopril (PRINIVIL,ZESTRIL) 10 MG tablet TAKE 1 TABLET (10 MG TOTAL) BY MOUTH DAILY. 90 tablet 3  . ONE TOUCH ULTRA TEST test strip USE TO TEST BLOOD SUGAR 2 TIMES DAILY AS INSTRUCTED. 200 each 3  . simvastatin (ZOCOR) 20 MG tablet TAKE 1 TABLET (20 MG TOTAL) BY MOUTH EVERY EVENING. 30 tablet 11  . tizanidine (ZANAFLEX) 2 MG capsule Take 1 capsule (2 mg total) by mouth 3 (three) times daily as needed for muscle spasms. 75 capsule 5  . triamcinolone cream (KENALOG) 0.5 % Apply 1 application topically 3 (three) times daily. 30 g 2  . cholestyramine (QUESTRAN) 4 GM/DOSE powder Take 1 packet (4 g total) by mouth daily. 378 g 12  . MICONAZOLE NITRATE VAGINAL (MONISTAT 3) 4 % CREA Place vaginally.     No facility-administered medications  prior to visit.    ROS Review of Systems  Constitutional: Negative.  Negative for fever, chills, diaphoresis, appetite change and fatigue.  HENT: Negative.   Eyes: Negative.   Respiratory: Negative.  Negative for cough, choking, chest tightness, shortness of breath and stridor.   Cardiovascular: Negative.  Negative for chest pain, palpitations and leg swelling.  Gastrointestinal: Positive for diarrhea. Negative for nausea, vomiting, abdominal pain, constipation, abdominal distention and anal bleeding.  Endocrine: Negative.   Genitourinary: Negative.   Musculoskeletal: Negative.   Allergic/Immunologic: Negative.   Neurological: Negative.  Negative for dizziness, tremors, light-headedness and numbness.  Hematological: Negative.  Negative for adenopathy. Does not bruise/bleed easily.  Psychiatric/Behavioral: Negative.     Objective:  BP 122/78 mmHg  Pulse 67  Temp(Src) 98 F (36.7 C) (Oral)  Resp 16  Ht 5\' 2"  (1.575 m)  Wt 175 lb (79.379 kg)  BMI 32.00 kg/m2  SpO2 98%  BP Readings from Last 3 Encounters:  04/05/16 122/78  02/29/16 124/80  10/27/15 138/80    Wt Readings from Last 3 Encounters:  04/05/16 175 lb (79.379 kg)  02/29/16 173 lb (78.472 kg)  10/27/15 177 lb (80.287 kg)    Physical Exam  Constitutional: She is oriented to person, place, and time. No distress.  HENT:  Mouth/Throat: Oropharynx is clear and moist. No oropharyngeal exudate.  Eyes: Conjunctivae are normal. Right  eye exhibits no discharge. Left eye exhibits no discharge. No scleral icterus.  Neck: Normal range of motion. Neck supple. No JVD present. No tracheal deviation present. No thyromegaly present.  Cardiovascular: Normal rate, regular rhythm, normal heart sounds and intact distal pulses.  Exam reveals no gallop and no friction rub.   No murmur heard. Pulmonary/Chest: Effort normal and breath sounds normal. No stridor. No respiratory distress. She has no wheezes. She has no rales. She exhibits  no tenderness.  Abdominal: Soft. Bowel sounds are normal. She exhibits no distension and no mass. There is no tenderness. There is no rebound and no guarding.  Musculoskeletal: Normal range of motion. She exhibits no edema or tenderness.  Lymphadenopathy:    She has no cervical adenopathy.  Neurological: She is oriented to person, place, and time.  Skin: Skin is warm and dry. No rash noted. She is not diaphoretic. No erythema. No pallor.    Lab Results  Component Value Date   WBC 7.8 10/22/2014   HGB 13.7 10/22/2014   HCT 41.9 10/22/2014   PLT 248.0 10/22/2014   GLUCOSE 211* 04/05/2016   CHOL 126 10/27/2015   TRIG 260.0* 10/27/2015   HDL 42.90 10/27/2015   LDLDIRECT 54.0 10/27/2015   LDLCALC 30 10/27/2013   ALT 12 10/22/2014   AST 13 10/22/2014   NA 139 04/05/2016   K 4.3 04/05/2016   CL 103 04/05/2016   CREATININE 0.86 04/05/2016   BUN 14 04/05/2016   CO2 28 04/05/2016   TSH 3.04 10/27/2015   HGBA1C 7.2 04/05/2016   MICROALBUR 0.9 04/05/2016    Dg Cervical Spine Complete  06/29/2015  CLINICAL DATA:  Neck and shoulder pain. Initial encounter. No known injury. EXAM: CERVICAL SPINE  4+ VIEWS COMPARISON:  None. FINDINGS: Diffuse multilevel degenerative change with endplate osteophyte formation and diffuse facet hypertrophy. Prominent narrowing of the right C3-C4 and C4-C5 neuroforamen noted. Narrowing of the left C2-C3 neuroforamen present. No acute abnormality. No evidence of fracture dislocation . Pulmonary apices are clear . IMPRESSION: Severe diffuse degenerative change with multifocal neuroforaminal narrowing. No acute abnormality identified . Electronically Signed   By: Marcello Moores  Register   On: 06/29/2015 08:37    Assessment & Plan:   Shaqueen was seen today for hypertension and diabetes.  Diagnoses and all orders for this visit:  Bile salt-induced diarrhea- I think she would benefit from an increase in the cholestyramine dose so I've asked her to take it twice a day instead  of once a day -     cholestyramine (QUESTRAN) 4 GM/DOSE powder; Take 1 packet (4 g total) by mouth 2 (two) times daily with a meal.  Type 2 diabetes mellitus with complication, without long-term current use of insulin (Balm)- her A1c is 7.2%, her blood sugars are adequately well controlled. -     Basic metabolic panel; Future -     Microalbumin / creatinine urine ratio; Future -     POCT HgB A1C  Essential hypertension, benign- her blood pressure is well-controlled, electrolytes and renal function are stable. -     Basic metabolic panel; Future -     Urinalysis, Routine w reflex microscopic (not at [facility]); Future  I have discontinued Ms. Trautman's MICONAZOLE NITRATE VAGINAL. I have also changed her cholestyramine. Additionally, I am having her maintain her triamcinolone cream, Vitamin D3, ONE TOUCH ULTRA TEST, tizanidine, simvastatin, lisinopril, CLIMARA, JANUMET XR, ALPRAZolam, and fluconazole.  Meds ordered this encounter  Medications  . cholestyramine (QUESTRAN) 4 GM/DOSE powder  Sig: Take 1 packet (4 g total) by mouth 2 (two) times daily with a meal.    Dispense:  378 g    Refill:  12     Follow-up: Return in about 6 months (around 10/05/2016).  [name], MD

## 2016-05-29 LAB — HM DIABETES EYE EXAM

## 2016-06-27 ENCOUNTER — Encounter: Payer: Self-pay | Admitting: Gynecology

## 2016-06-27 ENCOUNTER — Ambulatory Visit (INDEPENDENT_AMBULATORY_CARE_PROVIDER_SITE_OTHER): Payer: BLUE CROSS/BLUE SHIELD | Admitting: Gynecology

## 2016-06-27 VITALS — BP 122/80 | Ht 62.0 in | Wt 175.0 lb

## 2016-06-27 DIAGNOSIS — R350 Frequency of micturition: Secondary | ICD-10-CM | POA: Diagnosis not present

## 2016-06-27 DIAGNOSIS — N898 Other specified noninflammatory disorders of vagina: Secondary | ICD-10-CM

## 2016-06-27 DIAGNOSIS — R300 Dysuria: Secondary | ICD-10-CM | POA: Diagnosis not present

## 2016-06-27 DIAGNOSIS — IMO0001 Reserved for inherently not codable concepts without codable children: Secondary | ICD-10-CM

## 2016-06-27 LAB — URINALYSIS W MICROSCOPIC + REFLEX CULTURE
Bilirubin Urine: NEGATIVE
CRYSTALS: NONE SEEN [HPF]
Casts: NONE SEEN [LPF]
GLUCOSE, UA: NEGATIVE
KETONES UR: NEGATIVE
Nitrite: NEGATIVE
PH: 5 (ref 5.0–8.0)
PROTEIN: NEGATIVE
Specific Gravity, Urine: <1.005 (ref 1.001–1.035)
YEAST: NONE SEEN [HPF]

## 2016-06-27 LAB — WET PREP FOR TRICH, YEAST, CLUE
CLUE CELLS WET PREP: NONE SEEN
TRICH WET PREP: NONE SEEN
Yeast Wet Prep HPF POC: NONE SEEN

## 2016-06-27 MED ORDER — NITROFURANTOIN MONOHYD MACRO 100 MG PO CAPS: 100.0000 mg | ORAL_CAPSULE | Freq: Two times a day (BID) | ORAL | 0 refills | Status: DC

## 2016-06-27 MED ORDER — PHENAZOPYRIDINE HCL 200 MG PO TABS: 200.0000 mg | ORAL_TABLET | Freq: Three times a day (TID) | ORAL | 0 refills | Status: DC | PRN

## 2016-06-27 NOTE — Progress Notes (Signed)
   HPI: 70 year old patient presented to the office today complaining of several days history of vaginal pressure achy sensation and a slight questionable sticky brownish discharge on her undergarment after she voids. She is also having some dysuria. No back pain. Patient reports no fever, chills, nausea, vomiting. No GI complaints.   ROS: A ROS was performed and pertinent positives and negatives are included in the history.  GENERAL: No fevers or chills. HEENT: No change in vision, no earache, sore throat or sinus congestion. NECK: No pain or stiffness. CARDIOVASCULAR: No chest pain or pressure. No palpitations. PULMONARY: No shortness of breath, cough or wheeze. GASTROINTESTINAL: No abdominal pain, nausea, vomiting or diarrhea, melena or bright red blood per rectum. GENITOURINARY: No urinary frequency, urgency, hesitancy or dysuria. MUSCULOSKELETAL: No joint or muscle pain, no back pain, no recent trauma. DERMATOLOGIC: No rash, no itching, no lesions. ENDOCRINE: No polyuria, polydipsia, no heat or cold intolerance. No recent change in weight. HEMATOLOGICAL: No anemia or easy bruising or bleeding. NEUROLOGIC: No headache, seizures, numbness, tingling or weakness. PSYCHIATRIC: No depression, no loss of interest in normal activity or change in sleep pattern.   PE: Blood pressure 122/80 Gen. appearance well-developed well-nourished female with the above mentioned Pelvic exam: Bartholin urethra Skene was within normal limits Vagina: No lesions or discharge vaginal cuff intact Bimanual exam no palpable masses or tenderness Rectal exam not done  Wet prep few bacteria many white blood cell  Urinalysis: Packed WBC, 3-10 RBC, 10-20 epithelial cells, many bacteria   Assessment Plan: Patient with signs and symptoms consistent with urinary tract infection. Patient will be prescribed Macrobid one by mouth twice a day for 7 days. Pyridium 200 mg 3 times a day for 3 days for bladder spasm. Patient to schedule  her overdue annual exam.    Greater than 50% of time was spent in counseling and coordinating care of this patient.   Time of consultation: 15   Minutes.

## 2016-06-27 NOTE — Patient Instructions (Signed)
Phenazopyridine tablets  What is this medicine?  PHENAZOPYRIDINE (fen az oh PEER i deen) is a pain reliever. It is used to stop the pain, burning, or discomfort caused by infection or irritation of the urinary tract. This medicine is not an antibiotic. It will not cure a urinary tract infection.  This medicine may be used for other purposes; ask your health care provider or pharmacist if you have questions.  What should I tell my health care provider before I take this medicine?  They need to know if you have any of these conditions:  -glucose-6-phosphate dehydrogenase (G6PD) deficiency  -kidney disease  -an unusual or allergic reaction to phenazopyridine, other medicines, foods, dyes, or preservatives  -pregnant or trying to get pregnant  -breast-feeding  How should I use this medicine?  Take this medicine by mouth with a glass of water. Follow the directions on the prescription label. Take after meals. Take your doses at regular intervals. Do not take your medicine more often than directed. Do not skip doses or stop your medicine early even if you feel better. Do not stop taking except on your doctor's advice.  Talk to your pediatrician regarding the use of this medicine in children. Special care may be needed.  Overdosage: If you think you have taken too much of this medicine contact a poison control center or emergency room at once.  NOTE: This medicine is only for you. Do not share this medicine with others.  What if I miss a dose?  If you miss a dose, take it as soon as you can. If it is almost time for your next dose, take only that dose. Do not take double or extra doses.  What may interact with this medicine?  Interactions are not expected.  This list may not describe all possible interactions. Give your health care provider a list of all the medicines, herbs, non-prescription drugs, or dietary supplements you use. Also tell them if you smoke, drink alcohol, or use illegal drugs. Some items may interact  with your medicine.  What should I watch for while using this medicine?  Tell your doctor or health care professional if your symptoms do not improve or if they get worse.  This medicine colors body fluids red. This effect is harmless and will go away after you are done taking the medicine. It will change urine to an dark orange or red color. The red color may stain clothing. Soft contact lenses may become permanently stained. It is best not to wear soft contact lenses while taking this medicine.  If you are diabetic you may get a false positive result for sugar in your urine. Talk to your health care provider.  What side effects may I notice from receiving this medicine?  Side effects that you should report to your doctor or health care professional as soon as possible:  -allergic reactions like skin rash, itching or hives, swelling of the face, lips, or tongue  -blue or purple color of the skin  -difficulty breathing  -fever  -less urine  -unusual bleeding, bruising  -unusual tired, weak  -vomiting  -yellowing of the eyes or skin  Side effects that usually do not require medical attention (report to your doctor or health care professional if they continue or are bothersome):  -dark urine  -headache  -stomach upset  This list may not describe all possible side effects. Call your doctor for medical advice about side effects. You may report side effects to FDA at 1-800-FDA-1088.    expiration date. NOTE: This sheet is a summary. It may not cover all possible information. If you have questions about this medicine, talk to your doctor, pharmacist, or health care provider.    2016, Elsevier/Gold Standard. (2008-04-23 11:04:07) Nitrofurantoin tablets or capsules What is this medicine? NITROFURANTOIN  (nye troe fyoor AN toyn) is an antibiotic. It is used to treat urinary tract infections. This medicine may be used for other purposes; ask your health care provider or pharmacist if you have questions. What should I tell my health care provider before I take this medicine? They need to know if you have any of these conditions: -anemia -diabetes -glucose-6-phosphate dehydrogenase deficiency -kidney disease -liver disease -lung disease -other chronic illness -an unusual or allergic reaction to nitrofurantoin, other antibiotics, other medicines, foods, dyes or preservatives -pregnant or trying to get pregnant -breast-feeding How should I use this medicine? Take this medicine by mouth with a glass of water. Follow the directions on the prescription label. Take this medicine with food or milk. Take your doses at regular intervals. Do not take your medicine more often than directed. Do not stop taking except on your doctor's advice. Talk to your pediatrician regarding the use of this medicine in children. While this drug may be prescribed for selected conditions, precautions do apply. Overdosage: If you think you have taken too much of this medicine contact a poison control center or emergency room at once. NOTE: This medicine is only for you. Do not share this medicine with others. What if I miss a dose? If you miss a dose, take it as soon as you can. If it is almost time for your next dose, take only that dose. Do not take double or extra doses. What may interact with this medicine? -antacids containing magnesium trisilicate -probenecid -quinolone antibiotics like ciprofloxacin, lomefloxacin, norfloxacin and ofloxacin -sulfinpyrazone This list may not describe all possible interactions. Give your health care provider a list of all the medicines, herbs, non-prescription drugs, or dietary supplements you use. Also tell them if you smoke, drink alcohol, or use illegal drugs. Some items may  interact with your medicine. What should I watch for while using this medicine? Tell your doctor or health care professional if your symptoms do not improve or if you get new symptoms. Drink several glasses of water a day. If you are taking this medicine for a long time, visit your doctor for regular checks on your progress. If you are diabetic, you may get a false positive result for sugar in your urine with certain brands of urine tests. Check with your doctor. What side effects may I notice from receiving this medicine? Side effects that you should report to your doctor or health care professional as soon as possible: -allergic reactions like skin rash or hives, swelling of the face, lips, or tongue -chest pain -cough -difficulty breathing -dizziness, drowsiness -fever or infection -joint aches or pains -pale or blue-tinted skin -redness, blistering, peeling or loosening of the skin, including inside the mouth -tingling, burning, pain, or numbness in hands or feet -unusual bleeding or bruising -unusually weak or tired -yellowing of eyes or skin Side effects that usually do not require medical attention (report to your doctor or health care professional if they continue or are bothersome): -dark urine -diarrhea -headache -loss of appetite -nausea or vomiting -temporary hair loss This list may not describe all possible side effects. Call your doctor for medical advice about side effects. You may report side effects to FDA at 1-800-FDA-1088. Where  should I keep my medicine? Keep out of the reach of children. Store at room temperature between 15 and 30 degrees C (59 and 86 degrees F). Protect from light. Throw away any unused medicine after the expiration date. NOTE: This sheet is a summary. It may not cover all possible information. If you have questions about this medicine, talk to your doctor, pharmacist, or health care provider.    2016, Elsevier/Gold Standard. (2008-04-15  15:56:47) Urinary Tract Infection Urinary tract infections (UTIs) can develop anywhere along your urinary tract. Your urinary tract is your body's drainage system for removing wastes and extra water. Your urinary tract includes two kidneys, two ureters, a bladder, and a urethra. Your kidneys are a pair of bean-shaped organs. Each kidney is about the size of your fist. They are located below your ribs, one on each side of your spine. CAUSES Infections are caused by microbes, which are microscopic organisms, including fungi, viruses, and bacteria. These organisms are so small that they can only be seen through a microscope. Bacteria are the microbes that most commonly cause UTIs. SYMPTOMS  Symptoms of UTIs may vary by age and gender of the patient and by the location of the infection. Symptoms in young women typically include a frequent and intense urge to urinate and a painful, burning feeling in the bladder or urethra during urination. Older women and men are more likely to be tired, shaky, and weak and have muscle aches and abdominal pain. A fever may mean the infection is in your kidneys. Other symptoms of a kidney infection include pain in your back or sides below the ribs, nausea, and vomiting. DIAGNOSIS To diagnose a UTI, your caregiver will ask you about your symptoms. Your caregiver will also ask you to provide a urine sample. The urine sample will be tested for bacteria and white blood cells. White blood cells are made by your body to help fight infection. TREATMENT  Typically, UTIs can be treated with medication. Because most UTIs are caused by a bacterial infection, they usually can be treated with the use of antibiotics. The choice of antibiotic and length of treatment depend on your symptoms and the type of bacteria causing your infection. HOME CARE INSTRUCTIONS  If you were prescribed antibiotics, take them exactly as your caregiver instructs you. Finish the medication even if you feel  better after you have only taken some of the medication.  Drink enough water and fluids to keep your urine clear or pale yellow.  Avoid caffeine, tea, and carbonated beverages. They tend to irritate your bladder.  Empty your bladder often. Avoid holding urine for long periods of time.  Empty your bladder before and after sexual intercourse.  After a bowel movement, women should cleanse from front to back. Use each tissue only once. SEEK MEDICAL CARE IF:   You have back pain.  You develop a fever.  Your symptoms do not begin to resolve within 3 days. SEEK IMMEDIATE MEDICAL CARE IF:   You have severe back pain or lower abdominal pain.  You develop chills.  You have nausea or vomiting.  You have continued burning or discomfort with urination. MAKE SURE YOU:   Understand these instructions.  Will watch your condition.  Will get help right away if you are not doing well or get worse.   This information is not intended to replace advice given to you by your health care provider. Make sure you discuss any questions you have with your health care provider.  Document Released: 07/05/2005 Document Revised: 06/16/2015 Document Reviewed: 11/03/2011 Elsevier Interactive Patient Education [employer].

## 2016-06-29 LAB — URINE CULTURE: Organism ID, Bacteria: NO GROWTH

## 2016-07-05 ENCOUNTER — Telehealth: Payer: Self-pay

## 2016-07-05 MED ORDER — FLUCONAZOLE 150 MG PO TABS: 150.0000 mg | ORAL_TABLET | Freq: Once | ORAL | 0 refills | Status: AC

## 2016-07-05 MED ORDER — ESTRADIOL 0.1 MG/GM VA CREA: 1.0000 | TOPICAL_CREAM | VAGINAL | 1 refills | Status: DC

## 2016-07-05 NOTE — Telephone Encounter (Signed)
Her urine culture sensitivity was negative no microorganisms identified. I'm going to call her prescription Diflucan 150 mg one by mouth the event of a vulvar yeast infection but I would like her to start using Estrace vaginal cream twice a week intravaginal and external genitalia for her vaginal atrophy and irritation. She's also due for her annual exam.

## 2016-07-05 NOTE — Telephone Encounter (Signed)
Patient called stating she was in last Tuesday with UTI and was treated with Macrobid. Just took last capsule yesterday.  Complaining of vaginal itching. Also, c/o burning with urination and cloudy urine.  I questioned her if the burning is the urine irritating the skin from the itching or if it was actual urinary tract burning. She said she did not know.    What to Rec?

## 2016-07-05 NOTE — Telephone Encounter (Signed)
Patient advised. Rx's sent. Front desk will call her to schedule CE.

## 2016-07-21 ENCOUNTER — Telehealth: Payer: Self-pay | Admitting: Emergency Medicine

## 2016-07-21 ENCOUNTER — Other Ambulatory Visit: Payer: Self-pay | Admitting: Internal Medicine

## 2016-07-21 MED ORDER — FLUCONAZOLE 150 MG PO TABS: 150.0000 mg | ORAL_TABLET | Freq: Once | ORAL | 3 refills | Status: AC

## 2016-07-21 NOTE — Telephone Encounter (Signed)
Pt called and stated she is having constant yeast infections. She is wondering if it has to do with her medications. She is wondering if she can come in to have her A1C done and then come in on Oct 30th to go over result with you. Please advise thanks.

## 2016-07-21 NOTE — Telephone Encounter (Signed)
The only medication on her list that could be causing a yeast infection is the antibiotic for the bladder infection.  As she had the yeast infection treated?

## 2016-07-21 NOTE — Telephone Encounter (Signed)
I disagree about the Janumet, I don't think it is causing her vaginal yeast infection  She can come in next week and have Colletta [place] do her A1c up here if she wants to

## 2016-07-21 NOTE — Telephone Encounter (Signed)
Pt states she had a yeast before seeing Dr. Toney Rakes. He was the one who rx the antibiotic, but she keeps getting these yeast infections and she thinks its coming from her [place]. She has made appt to see you on 10/30 for f/u, and she is wanting to have her A1C test prior to the visit, but in the meantime she states she need a diflucan called in she is itching/dischange very discomfort...Sydney Avila

## 2016-07-21 NOTE — Telephone Encounter (Signed)
Called pt no answer LMOM w/MD response. MD sent diflucan to Sydney Avila...Sydney Avila

## 2016-08-01 ENCOUNTER — Telehealth: Payer: Self-pay

## 2016-08-01 ENCOUNTER — Other Ambulatory Visit: Payer: Self-pay | Admitting: Internal Medicine

## 2016-08-01 MED ORDER — ALPRAZOLAM 0.5 MG PO TABS: ORAL_TABLET | ORAL | 1 refills | Status: DC

## 2016-08-01 NOTE — Telephone Encounter (Signed)
Rx faxed to [facility].  

## 2016-08-01 NOTE — Telephone Encounter (Signed)
[facility] rx rf rq for Alprazolam

## 2016-08-01 NOTE — Telephone Encounter (Signed)
done

## 2016-08-02 ENCOUNTER — Encounter: Payer: BLUE CROSS/BLUE SHIELD | Admitting: Gynecology

## 2016-08-07 ENCOUNTER — Ambulatory Visit (INDEPENDENT_AMBULATORY_CARE_PROVIDER_SITE_OTHER): Payer: BLUE CROSS/BLUE SHIELD | Admitting: Gynecology

## 2016-08-07 ENCOUNTER — Ambulatory Visit (INDEPENDENT_AMBULATORY_CARE_PROVIDER_SITE_OTHER): Payer: BLUE CROSS/BLUE SHIELD | Admitting: Internal Medicine

## 2016-08-07 ENCOUNTER — Encounter: Payer: Self-pay | Admitting: Internal Medicine

## 2016-08-07 ENCOUNTER — Encounter: Payer: Self-pay | Admitting: Gynecology

## 2016-08-07 VITALS — BP 130/70 | Ht 61.75 in | Wt 177.6 lb

## 2016-08-07 VITALS — BP 128/78 | HR 69 | Temp 97.9°F | Resp 16 | Ht 62.0 in | Wt 176.5 lb

## 2016-08-07 DIAGNOSIS — Z23 Encounter for immunization: Secondary | ICD-10-CM | POA: Diagnosis not present

## 2016-08-07 DIAGNOSIS — N898 Other specified noninflammatory disorders of vagina: Secondary | ICD-10-CM | POA: Diagnosis not present

## 2016-08-07 DIAGNOSIS — Z9229 Personal history of other drug therapy: Secondary | ICD-10-CM | POA: Diagnosis not present

## 2016-08-07 DIAGNOSIS — B373 Candidiasis of vulva and vagina: Secondary | ICD-10-CM | POA: Diagnosis not present

## 2016-08-07 DIAGNOSIS — I10 Essential (primary) hypertension: Secondary | ICD-10-CM | POA: Diagnosis not present

## 2016-08-07 DIAGNOSIS — E66811 Obesity, class 1: Secondary | ICD-10-CM

## 2016-08-07 DIAGNOSIS — N760 Acute vaginitis: Secondary | ICD-10-CM

## 2016-08-07 DIAGNOSIS — E118 Type 2 diabetes mellitus with unspecified complications: Secondary | ICD-10-CM | POA: Diagnosis not present

## 2016-08-07 DIAGNOSIS — B9689 Other specified bacterial agents as the cause of diseases classified elsewhere: Secondary | ICD-10-CM | POA: Diagnosis not present

## 2016-08-07 DIAGNOSIS — E669 Obesity, unspecified: Secondary | ICD-10-CM

## 2016-08-07 DIAGNOSIS — Z01411 Encounter for gynecological examination (general) (routine) with abnormal findings: Secondary | ICD-10-CM | POA: Diagnosis not present

## 2016-08-07 DIAGNOSIS — B3731 Acute candidiasis of vulva and vagina: Secondary | ICD-10-CM

## 2016-08-07 DIAGNOSIS — N952 Postmenopausal atrophic vaginitis: Secondary | ICD-10-CM | POA: Diagnosis not present

## 2016-08-07 LAB — WET PREP FOR TRICH, YEAST, CLUE
CLUE CELLS WET PREP: NONE SEEN
TRICH WET PREP: NONE SEEN

## 2016-08-07 MED ORDER — FLUCONAZOLE 100 MG PO TABS: 100.0000 mg | ORAL_TABLET | Freq: Every day | ORAL | 1 refills | Status: DC

## 2016-08-07 MED ORDER — ESTRADIOL 10 MCG VA TABS: 1.0000 | ORAL_TABLET | VAGINAL | 11 refills | Status: DC

## 2016-08-07 MED ORDER — METRONIDAZOLE 0.75 % VA GEL: 1.0000 | Freq: Two times a day (BID) | VAGINAL | 0 refills | Status: DC

## 2016-08-07 NOTE — Patient Instructions (Addendum)
Estradiol vaginal tablets What is this medicine? ESTRADIOL (es tra DYE ole) vaginal tablet is used to help relieve symptoms of vaginal irritation and dryness that occurs in some women during menopause. This medicine may be used for other purposes; ask your health care provider or pharmacist if you have questions. What should I tell my health care provider before I take this medicine? They need to know if you have any of these conditions: -abnormal vaginal bleeding -blood vessel disease or blood clots -breast, cervical, endometrial, ovarian, liver, or uterine cancer -dementia -diabetes -gallbladder disease -heart disease or recent heart attack -high blood pressure -high cholesterol -high level of calcium in the blood -hysterectomy -kidney disease -liver disease -migraine headaches -protein C deficiency -protein S deficiency -stroke -systemic lupus erythematosus (SLE) -tobacco smoker -an unusual or allergic reaction to estrogens, other hormones, medicines, foods, dyes, or preservatives -pregnant or trying to get pregnant -breast-feeding How should I use this medicine? This medicine is only for use in the vagina. Do not take by mouth. Wash and dry your hands before and after use. Read package directions carefully. Unwrap the applicator package. Be sure to use a new applicator for each dose. Use at the same time each day. If the tablet has fallen out of the applicator, but is still in the package, carefully place it back into the applicator. If the tablet has fallen out of the package, that applicator should be thrown out and you should use a new applicator containing a new tablet. Lie on your back, part and bend your knees. Gently insert the applicator as far as comfortably possible into the vagina. Then, gently press the plunger until the plunger is fully depressed. This will release the tablet into the vagina. Gently remove the applicator. Throw away the applicator after use. Do not use  your medicine more often than directed. Do not stop using except on the advice of your doctor or health care professional. Talk to your pediatrician regarding the use of this medicine in children. This medicine is not approved for use in children. A patient package insert for the product will be given with each prescription and refill. Read this sheet carefully each time. The sheet may change frequently. Overdosage: If you think you have taken too much of this medicine contact a poison control center or emergency room at once. NOTE: This medicine is only for you. Do not share this medicine with others. What if I miss a dose? If you miss a dose, take it as soon as you can. If it is almost time for your next dose, take only that dose. Do not take double or extra doses. What may interact with this medicine? Do not take this medicine with any of the following medications: -aromatase inhibitors like aminoglutethimide, anastrozole, exemestane, letrozole, testolactone This medicine may also interact with the following medications: -antibiotics used to treat tuberculosis like rifabutin, rifampin and rifapentene -raloxifene or tamoxifen -warfarin This list may not describe all possible interactions. Give your health care provider a list of all the medicines, herbs, non-prescription drugs, or dietary supplements you use. Also tell them if you smoke, drink alcohol, or use illegal drugs. Some items may interact with your medicine. What should I watch for while using this medicine? Visit your health care professional for regular checks on your progress. You will need a regular breast and pelvic exam. You should also discuss the need for regular mammograms with your health care professional, and follow his or her guidelines. This medicine can make  your body retain fluid, making your fingers, hands, or ankles swell. Your blood pressure can go up. Contact your doctor or health care professional if you feel you are  retaining fluid. If you have any reason to think you are pregnant; stop taking this medicine at once and contact your doctor or health care professional. Tobacco smoking increases the risk of getting a blood clot or having a stroke, especially if you are more than 72 years old. You are strongly advised not to smoke. If you wear contact lenses and notice visual changes, or if the lenses begin to feel uncomfortable, consult your eye care specialist. If you are going to have elective surgery, you may need to stop taking this medicine beforehand. Consult your health care professional for advice prior to scheduling the surgery. What side effects may I notice from receiving this medicine? Side effects that you should report to your doctor or health care professional as soon as possible: -allergic reactions like skin rash, itching or hives, swelling of the face, lips, or tongue -breast tissue changes or discharge -changes in vision -chest pain -confusion, trouble speaking or understanding -dark urine -general ill feeling or flu-like symptoms -light-colored stools -nausea, vomiting -pain, swelling, warmth in the leg -right upper belly pain -severe headaches -shortness of breath -sudden numbness or weakness of the face, arm or leg -trouble walking, dizziness, loss of balance or coordination -unusual vaginal bleeding -yellowing of the eyes or skin Side effects that usually do not require medical attention (report to your doctor or health care professional if they continue or are bothersome): -hair loss -increased hunger or thirst -increased urination -symptoms of vaginal infection like itching, irritation or unusual discharge -unusually weak or tired This list may not describe all possible side effects. Call your doctor for medical advice about side effects. You may report side effects to FDA at 1-800-FDA-1088. Where should I keep my medicine? Keep out of the reach of children. Store at room  temperature between 15 and 30 degrees C (59 and 86 degrees F). Throw away any unused medicine after the expiration date. NOTE: This sheet is a summary. It may not cover all possible information. If you have questions about this medicine, talk to your doctor, pharmacist, or health care provider.    2016, Elsevier/[employer] Standard. (2014-09-09 09:22:51)  Metronidazole vaginal gel What is this medicine? METRONIDAZOLE (me troe NI da zole) VAGINAL GEL is an antiinfective. It is used to treat bacterial vaginitis. This medicine may be used for other purposes; ask your health care provider or pharmacist if you have questions. What should I tell my health care provider before I take this medicine? They need to know if you have any of these conditions: -if you drink alcohol containing drinks -if you have taken disulfiram in the past two weeks -liver disease -peripheral neuropathy -seizures -an unusual or allergic reaction to metronidazole, parabens, nitroimidazoles, or other medicines, foods, dyes, or preservatives -pregnant or trying to get pregnant -breast-feeding How should I use this medicine? This medicine is only for use in the vagina. Do not take by mouth or apply to other areas of the body. Follow the directions on the prescription label. Wash hands before and after use. Screw the applicator to the tube and squeeze the tube gently to fill the applicator. Lie on your back, part and bend your knees. Insert the applicator tip high in the vagina and push the plunger to release the gel into the vagina. Gently remove the applicator. Wash the applicator well  with warm water and soap. Use at regular intervals. Finish the full course prescribed by your doctor or health care professional even if you think your condition is better. Do not stop using except on the advice of your doctor or health care professional. Talk to your pediatrician regarding the use of this medicine in children. Special care may be  needed. Overdosage: If you think you have taken too much of this medicine contact a poison control center or emergency room at once. NOTE: This medicine is only for you. Do not share this medicine with others. What if I miss a dose? If you miss a dose, use it as soon as you can. If it is almost time for your next dose, use only that dose. Do not use double or extra doses. What may interact with this medicine? Do not take this medicine with any of the following medications: -alcohol or any product that contains alcohol -cisapride -dofetilide -dronedarone -pimozide -thioridazine -ziprasidone This medicine may also interact with the following medications: -cimetidine -lithium -other medicines that prolong the QT interval (cause an abnormal heart rhythm) -warfarin This list may not describe all possible interactions. Give your health care provider a list of all the medicines, herbs, non-prescription drugs, or dietary supplements you use. Also tell them if you smoke, drink alcohol, or use illegal drugs. Some items may interact with your medicine. What should I watch for while using this medicine? Tell your doctor or health care professional if your symptoms do not start to get better in 2 or 3 days. Avoid alcoholic drinks while you are taking this medicine and for three days afterwards. Alcohol may make you feel dizzy, sick, or flushed. You may get drowsy or dizzy. Do not drive, use machinery, or do anything that needs mental alertness until you know how this medicine affects you. To reduce the risk of dizzy or fainting spells, do not sit or stand up quickly, especially if you are an older patient. Your clothing may get soiled if you have a vaginal discharge. You can wear a sanitary napkin. Do not use tampons. Wear freshly washed cotton, not synthetic, panties. Do not have sex until you have finished your treatment. Having sex can make the treatment less effective. Your sexual partner may also  need treatment. What side effects may I notice from receiving this medicine? Side effects that you should report to your doctor or health care professional as soon as possible: -dizziness -frequent passing of urine -headache -loss of appetite -nausea -skin rash, itching -stomach pain or cramps -vaginal irritation or discharge -vulvar burning or swelling Side effects that usually do not require medical attention (report to your doctor or health care professional if they continue or are bothersome): -dark urine -mild vaginal burning This list may not describe all possible side effects. Call your doctor for medical advice about side effects. You may report side effects to FDA at 1-800-FDA-1088. Where should I keep my medicine? Keep out of the reach of children. Store at room temperature between 15 and 30 degrees C (59 and 86 degrees F). Do not freeze. Throw away any unused medicine after the expiration date. NOTE: This sheet is a summary. It may not cover all possible information. If you have questions about this medicine, talk to your doctor, pharmacist, or health care provider.    2016, Elsevier/[employer] Standard. (2013-05-02 14:09:23) Fluconazole injection What is this medicine? FLUCONAZOLE (floo KON na zole) is an antifungal medicine. It is used to treat or prevent certain kinds  of fungal or yeast infections. This medicine may be used for other purposes; ask your health care provider or pharmacist if you have questions. What should I tell my health care provider before I take this medicine? They need to know if you have any of these conditions: -history of irregular heart beat -kidney disease -an unusual or allergic reaction to fluconazole, other antifungal medicines, foods, dyes or preservatives -pregnant or trying to get pregnant -breast-feeding How should I use this medicine? This medicine is for injection into a vein. It is usually given by a health care professional in a hospital  or clinic setting. If you get this medicine at home, you will be taught how to prepare and give this medicine. Use exactly as directed. Take your medicine at regular intervals. Do not take your medicine more often than directed. It is important that you put your used needles and syringes in a special sharps container. Do not put them in a trash can. If you do not have a sharps container, call your pharmacist or healthcare provider to get one. Talk to your pediatrician regarding the use of this medicine in children. Special care may be needed. Overdosage: If you think you have taken too much of this medicine contact a poison control center or emergency room at once. NOTE: This medicine is only for you. Do not share this medicine with others. What if I miss a dose? This does not apply. What may interact with this medicine? Do not take this medicine with any of the following medications: -cisapride -pimozide -red yeast rice This medicine may also interact with the following medications: -birth control pills -cyclosporine -diuretics like hydrochlorothiazide -medicines for diabetes that are taken by mouth -medicines for high cholesterol like atorvastatin, lovastatin or simvastatin -phenytoin -ramelteon -rifabutin -rifampin -some medicines for anxiety or sleep -tacrolimus -terfenadine -theophylline -tofacitinib -warfarin This list may not describe all possible interactions. Give your health care provider a list of all the medicines, herbs, non-prescription drugs, or dietary supplements you use. Also tell them if you smoke, drink alcohol, or use illegal drugs. Some items may interact with your medicine. What should I watch for while using this medicine? Tell your doctor if your symptoms do not improve. If you are taking this medicine for a long time you may need blood work. Some fungal infections need many weeks or months of treatment to cure completely. Alcohol can increase possible damage  to your liver from this medicine. Avoid alcoholic drinks. What side effects may I notice from receiving this medicine? Side effects that you should report to your doctor or health care professional as soon as possible: -allergic reactions like skin rash or itching, hives, swelling of the lips, mouth, tongue, or throat -dark urine -feeling dizzy or faint -irregular heartbeat or chest pain -pain, redness at site of injection -redness, blistering, peeling or loosening of the skin, including inside the mouth -stomach pain -trouble breathing -unusual bruising or bleeding -vomiting -yellowing of the eyes or skin Side effects that usually do not require medical attention (report to your doctor or health care professional if they continue or are bothersome): -changes in how food tastes -diarrhea -headache -stomach upset, nausea This list may not describe all possible side effects. Call your doctor for medical advice about side effects. You may report side effects to FDA at 1-800-FDA-1088. Where should I keep my medicine? Keep out of the reach of children. If you are using this medicine at home, you will be instructed on how to store  this medicine. Throw away any unused medicine after the expiration date on the label. NOTE: This sheet is a summary. It may not cover all possible information. If you have questions about this medicine, talk to your doctor, pharmacist, or health care provider.    2016, Else[employer] Standard. (2013-05-03 15:51:41)  Monilial Vaginitis Vaginitis in a soreness, swelling and redness (inflammation) of the vagina and vulva. Monilial vaginitis is not a sexually transmitted infection. CAUSES  Yeast vaginitis is caused by yeast (candida) that is normally found in your vagina. With a yeast infection, the candida has overgrown in number to a point that upsets the chemical balance. SYMPTOMS   White, thick vaginal discharge.  Swelling, itching, redness and irritation of  the vagina and possibly the lips of the vagina (vulva).  Burning or painful urination.  Painful intercourse. DIAGNOSIS  Things that may contribute to monilial vaginitis are:  Postmenopausal and virginal states.  Pregnancy.  Infections.  Being tired, sick or stressed, especially if you had monilial vaginitis in the past.  Diabetes. Good control will help lower the chance.  Birth control pills.  Tight fitting garments.  Using bubble bath, feminine sprays, douches or deodorant tampons.  Taking certain medications that kill germs (antibiotics).  Sporadic recurrence can occur if you become ill. TREATMENT  Your caregiver will give you medication.  There are several kinds of anti monilial vaginal creams and suppositories specific for monilial vaginitis. For recurrent yeast infections, use a suppository or cream in the vagina 2 times a week, or as directed.  Anti-monilial or steroid cream for the itching or irritation of the vulva may also be used. Get your caregiver's permission.  Painting the vagina with methylene blue solution may help if the monilial cream does not work.  Eating yogurt may help prevent monilial vaginitis. HOME CARE INSTRUCTIONS   Finish all medication as prescribed.  Do not have sex until treatment is completed or after your caregiver tells you it is okay.  Take warm sitz baths.  Do not douche.  Do not use tampons, especially scented ones.  Wear cotton underwear.  Avoid tight pants and panty hose.  Tell your sexual partner that you have a yeast infection. They should go to their caregiver if they have symptoms such as mild rash or itching.  Your sexual partner should be treated as well if your infection is difficult to eliminate.  Practice safer sex. Use condoms.  Some vaginal medications cause latex condoms to fail. Vaginal medications that harm condoms are:  Cleocin cream.  Butoconazole (Femstat).  Terconazole (Terazol) vaginal  suppository.  Miconazole (Monistat) (may be purchased over the counter). SEEK MEDICAL CARE IF:   You have a temperature by mouth above 102 F (38.9 C).  The infection is getting worse after 2 days of treatment.  The infection is not getting better after 3 days of treatment.  You develop blisters in or around your vagina.  You develop vaginal bleeding, and it is not your menstrual period.  You have pain when you urinate.  You develop intestinal problems.  You have pain with sexual intercourse.   This information is not intended to replace advice given to you by your health care provider. Make sure you discuss any questions you have with your health care provider.   Document Released: 07/05/2005 Document Revised: 12/18/2011 Document Reviewed: 03/29/2015 Elsevier Interactive Patient Education [employer].

## 2016-08-07 NOTE — Progress Notes (Signed)
Subjective:  Patient ID: Sydney Avila, female    DOB: [birth year]  Age: 70 y.o. MRN: [id number]  CC: Diabetes   HPI Sydney Avila presents for f/up on DM2, she is being treated by GYN for recurrent vaginal infections and is concerned that high blood sugars may be contributing to this but she denies polys, visual changes, or episodes of hyperglycemia. She has not been able to lose weight.  Outpatient Medications Prior to Visit  Medication Sig Dispense Refill  . ALPRAZolam (XANAX) 0.5 MG tablet TAKE 1 TABLET BY MOUTH EVERY NIGHT AT BEDTIME AS NEEDED FOR ANXIETY  (NEED FOLLOW UP WITH DR Ronnald Ramp FOR REFILLS) 30 tablet 1  . cholestyramine (QUESTRAN) 4 GM/DOSE powder Take 1 packet (4 g total) by mouth 2 (two) times daily with a meal. 378 g 12  . JANUMET XR 50-1000 MG TB24 TAKE 2 TABLETS BY MOUTH DAILY. 180 tablet 3  . lisinopril (PRINIVIL,ZESTRIL) 10 MG tablet TAKE 1 TABLET (10 MG TOTAL) BY MOUTH DAILY. 90 tablet 3  . ONE TOUCH ULTRA TEST test strip USE TO TEST BLOOD SUGAR 2 TIMES DAILY AS INSTRUCTED. 200 each 3  . phenazopyridine (PYRIDIUM) 200 MG tablet Take 1 tablet (200 mg total) by mouth 3 (three) times daily as needed for pain. 9 tablet 0  . simvastatin (ZOCOR) 20 MG tablet TAKE 1 TABLET (20 MG TOTAL) BY MOUTH EVERY EVENING. 30 tablet 11  . CLIMARA 0.06 MG/24HR APPLY 1 PATCH TRANSDERMALLYTO CLEAN DRY SKIN ONCE A   WEEK 12 patch 3  . estradiol (ESTRACE VAGINAL) 0.1 MG/GM vaginal cream Place 1 Applicatorful vaginally 2 (two) times a week. 42.5 g 1   No facility-administered medications prior to visit.     ROS Review of Systems  Constitutional: Negative.  Negative for chills, diaphoresis and fatigue.  HENT: Negative.   Eyes: Negative for visual disturbance.  Respiratory: Negative.  Negative for cough, choking, chest tightness, shortness of breath and stridor.   Cardiovascular: Negative.  Negative for chest pain and leg swelling.  Gastrointestinal: Negative.  Negative for abdominal pain,  constipation, diarrhea, nausea and vomiting.  Endocrine: Negative for cold intolerance, heat intolerance, polydipsia, polyphagia and polyuria.  Genitourinary: Positive for vaginal discharge. Negative for difficulty urinating, dysuria, flank pain, frequency and hematuria.  Musculoskeletal: Negative.  Negative for arthralgias, myalgias and neck pain.  Skin: Negative for color change.  Allergic/Immunologic: Negative.   Neurological: Negative.  Negative for dizziness.  Hematological: Negative.  Negative for adenopathy. Does not bruise/bleed easily.  Psychiatric/Behavioral: Negative.     Objective:  BP 128/78 (BP Location: Left Arm, Patient Position: Sitting, Cuff Size: Large)   Pulse 69   Temp 97.9 F (36.6 C) (Oral)   Resp 16   Ht 5\' 2"  (1.575 m)   Wt 176 lb 8 oz (80.1 kg)   SpO2 96%   BMI 32.28 kg/m   BP Readings from Last 3 Encounters:  08/07/16 130/70  08/07/16 128/78  06/27/16 122/80    Wt Readings from Last 3 Encounters:  08/07/16 177 lb 9.6 oz (80.6 kg)  08/07/16 176 lb 8 oz (80.1 kg)  06/27/16 175 lb (79.4 kg)    Physical Exam  Constitutional: She is oriented to person, place, and time. No distress.  HENT:  Mouth/Throat: Oropharynx is clear and moist. No oropharyngeal exudate.  Eyes: Conjunctivae are normal. Left eye exhibits no discharge. No scleral icterus.  Neck: Normal range of motion. Neck supple. No JVD present. No tracheal deviation present. No thyromegaly present.  Cardiovascular: Normal rate, regular rhythm, normal heart sounds and intact distal pulses.  Exam reveals no gallop and no friction rub.   No murmur heard. Pulmonary/Chest: Effort normal and breath sounds normal. No stridor. No respiratory distress. She has no wheezes. She has no rales. She exhibits no tenderness.  Abdominal: Soft. Bowel sounds are normal. She exhibits no distension and no mass. There is no tenderness. There is no rebound and no guarding.  Musculoskeletal: Normal range of motion. She  exhibits no edema, tenderness or deformity.  Lymphadenopathy:    She has no cervical adenopathy.  Neurological: She is oriented to person, place, and time.  Skin: Skin is dry. No rash noted. She is not diaphoretic. No erythema. No pallor.  Vitals reviewed.   Lab Results  Component Value Date   WBC 7.8 10/22/2014   HGB 13.7 10/22/2014   HCT 41.9 10/22/2014   PLT 248.0 10/22/2014   GLUCOSE 211 (H) 04/05/2016   CHOL 126 10/27/2015   TRIG 260.0 (H) 10/27/2015   HDL 42.90 10/27/2015   LDLDIRECT 54.0 10/27/2015   LDLCALC 30 10/27/2013   ALT 12 10/22/2014   AST 13 10/22/2014   NA 139 04/05/2016   K 4.3 04/05/2016   CL 103 04/05/2016   CREATININE 0.86 04/05/2016   BUN 14 04/05/2016   CO2 28 04/05/2016   TSH 3.04 10/27/2015   HGBA1C 7.2 04/05/2016   MICROALBUR 0.9 04/05/2016    Dg Cervical Spine Complete  Result Date: 06/29/2015 CLINICAL DATA:  Neck and shoulder pain. Initial encounter. No known injury. EXAM: CERVICAL SPINE  4+ VIEWS COMPARISON:  None. FINDINGS: Diffuse multilevel degenerative change with endplate osteophyte formation and diffuse facet hypertrophy. Prominent narrowing of the right C3-C4 and C4-C5 neuroforamen noted. Narrowing of the left C2-C3 neuroforamen present. No acute abnormality. No evidence of fracture dislocation . Pulmonary apices are clear . IMPRESSION: Severe diffuse degenerative change with multifocal neuroforaminal narrowing. No acute abnormality identified . Electronically Signed   By: Marcello Moores  Register   On: 06/29/2015 08:37    Assessment & Plan:   Sydney Avila was seen today for diabetes.  Diagnoses and all orders for this visit:  Essential hypertension, benign- Her blood pressure is well-controlled  Obesity (BMI 30.0-34.9)- she is not interested in taking a medication to help her lose weight but she is willing to work on her lifestyle modifications.  Type 2 diabetes mellitus with complication, without long-term current use of insulin ([place])- her A1c  shows blood sugars are adequately well controlled, I don't think hyperglycemia is contributing to her yeast infections, she is not willing to add any additional medications at this time to lower her blood sugars but she is willing to work on her lifestyle modifications  Need for prophylactic vaccination and inoculation against influenza -     Flu vaccine HIGH DOSE PF (Fluzone High dose)   I am having Sydney Avila maintain her ONE TOUCH ULTRA TEST, simvastatin, lisinopril, JANUMET XR, cholestyramine, phenazopyridine, ALPRAZolam, and tiZANidine.  Meds ordered this encounter  Medications  . tiZANidine (ZANAFLEX) 2 MG tablet     Follow-up: No Follow-up on file.  [name], MD

## 2016-08-07 NOTE — Progress Notes (Signed)
NKECHI SMATHERS 27-May-1946 [id number]   History:    70 y.o.  for annual gyn exam with complaint of a vaginal discharge being yellow in color minimal odor very little pruritus patient not sexually active. Patient's primary physician is Dr. Jeneen Rinks who is been doing her annual exam and her blood work. She had been on Climara transdermal patch twice a week and recently for vaginal dryness she was also using vaginal estrogen that only used it for less than a month because it was too expensive. She does have a history of TAH/BSO in 1983 for fibroids. She reports prior to that she always had normal Pap smears. Her colonoscopy is up-to-date and she is on a 10 year recall. Her last bone density study was in 2010.  Past medical history,surgical history, family history and social history were all reviewed and documented in the EPIC chart.  Gynecologic History No LMP recorded. Patient has had a hysterectomy. Contraception: status post hysterectomy Last Pap: See above. Results were: See above Last mammogram: 2017. Results were: normal  Obstetric History OB History  Gravida Para Term Preterm AB Living  1 1 1     1   SAB TAB Ectopic Multiple Live Births               # Outcome Date GA Lbr Len/2nd Weight Sex Delivery Anes PTL Lv  1 Term                ROS: A ROS was performed and pertinent positives and negatives are included in the history.  GENERAL: No fevers or chills. HEENT: No change in vision, no earache, sore throat or sinus congestion. NECK: No pain or stiffness. CARDIOVASCULAR: No chest pain or pressure. No palpitations. PULMONARY: No shortness of breath, cough or wheeze. GASTROINTESTINAL: No abdominal pain, nausea, vomiting or diarrhea, melena or bright red blood per rectum. GENITOURINARY: No urinary frequency, urgency, hesitancy or dysuria. MUSCULOSKELETAL: No joint or muscle pain, no back pain, no recent trauma. DERMATOLOGIC: No rash, no itching, no lesions. ENDOCRINE: No polyuria, polydipsia,  no heat or cold intolerance. No recent change in weight. HEMATOLOGICAL: No anemia or easy bruising or bleeding. NEUROLOGIC: No headache, seizures, numbness, tingling or weakness. PSYCHIATRIC: No depression, no loss of interest in normal activity or change in sleep pattern.     Exam: chaperone present  BP 130/70   Ht 5' 1.75" (1.568 m)   Wt 177 lb 9.6 oz (80.6 kg)   BMI 32.75 kg/m   Body mass index is 32.75 kg/m.  General appearance : Well developed well nourished female. No acute distress HEENT: Eyes: no retinal hemorrhage or exudates,  Neck supple, trachea midline, no carotid bruits, no thyroidmegaly Lungs: Clear to auscultation, no rhonchi or wheezes, or rib retractions  Heart: Regular rate and rhythm, no murmurs or gallops Breast:Examined in sitting and supine position were symmetrical in appearance, no palpable masses or tenderness,  no skin retraction, no nipple inversion, no nipple discharge, no skin discoloration, no axillary or supraclavicular lymphadenopathy Abdomen: no palpable masses or tenderness, no rebound or guarding Extremities: no edema or skin discoloration or tenderness  Pelvic:  Bartholin, Urethra, Skene Glands: Within normal limits             Vagina: Clear discharge small donor  Cervix: Absent  Uterus absent  Adnexa  Without masses or tenderness  Anus and perineum  normal   Rectovaginal  normal sphincter tone without palpated masses or tenderness  Hemoccult PCP provides   Wet prep few yeast, too numerous to count white blood cells moderate bacteria  Assessment/Plan:  70 y.o. female for annual exam with clinical evidence of vaginal atrophy but wet prep demonstrating yeast vaginitis for which she will take Diflucan 100 mg 1 by mouth today. She was instructed to hold off on her statin for 2 days. Also for bacterial vaginosis she'll be placed on MetroGel vaginal to apply daily at bedtime for 5-7 days. Pap smear not indicated. PCP is been doing her blood  work. She will schedule her bone density study here in the office in the next few weeks. She was instructed to discontinue the Climara transdermal patch she will be prescribed Vagifem 10 g to apply intravaginally twice a week for vaginal atrophy. The risks benefits and pros and cons were discussed. The women's health initiative study current guidelines on hormonal replacement therapy was discussed. Her vaccines and blood work are up-to-date by her PCP.   Terrance Mass MD, 4:03 PM 08/07/2016

## 2016-08-07 NOTE — Progress Notes (Signed)
Pre visit review using our clinic review tool, if applicable. No additional management support is needed unless otherwise documented below in the visit note. 

## 2016-08-08 ENCOUNTER — Telehealth: Payer: Self-pay

## 2016-08-08 NOTE — Patient Instructions (Signed)

## 2016-08-08 NOTE — Telephone Encounter (Signed)
LVM for pt to call back as soon as possible.   RE: Does pt want a copy and does pt want me to fax completed form?

## 2016-08-08 NOTE — Telephone Encounter (Signed)
Patient called back. She would like a copy mailed to her & the other faxed over. Thank you.

## 2016-08-09 ENCOUNTER — Telehealth: Payer: Self-pay | Admitting: *Deleted

## 2016-08-09 NOTE — Telephone Encounter (Signed)
April [phone] called from [employer] requesting ICD-10 code for bone density that will be scheduled her in office. Z92.29 code given

## 2016-08-09 NOTE — Telephone Encounter (Signed)
Pt copy mailed to address on file. Copy to chart and form faxed to number on wellness form.

## 2016-08-14 ENCOUNTER — Other Ambulatory Visit: Payer: Self-pay | Admitting: Gynecology

## 2016-08-14 ENCOUNTER — Other Ambulatory Visit: Payer: Self-pay | Admitting: Internal Medicine

## 2016-08-14 DIAGNOSIS — Z1231 Encounter for screening mammogram for malignant neoplasm of breast: Secondary | ICD-10-CM

## 2016-08-18 ENCOUNTER — Ambulatory Visit (HOSPITAL_BASED_OUTPATIENT_CLINIC_OR_DEPARTMENT_OTHER)
Admission: RE | Admit: 2016-08-18 | Discharge: 2016-08-18 | Disposition: A | Discharge status: Home / Self Care | Payer: BLUE CROSS/BLUE SHIELD | Source: Ambulatory Visit | Attending: Gynecology | Admitting: Gynecology

## 2016-08-18 DIAGNOSIS — Z1231 Encounter for screening mammogram for malignant neoplasm of breast: Secondary | ICD-10-CM | POA: Diagnosis not present

## 2016-08-18 LAB — HM MAMMOGRAPHY

## 2016-08-22 ENCOUNTER — Other Ambulatory Visit: Payer: Self-pay | Admitting: Internal Medicine

## 2016-08-23 ENCOUNTER — Telehealth: Payer: Self-pay

## 2016-08-23 MED ORDER — GLUCOSE BLOOD VI STRP: ORAL_STRIP | 3 refills | Status: DC

## 2016-08-23 NOTE — Telephone Encounter (Signed)
Rf rq for Test strips. Erx sent.

## 2016-09-18 ENCOUNTER — Other Ambulatory Visit: Payer: Self-pay | Admitting: Internal Medicine

## 2016-09-30 ENCOUNTER — Encounter: Payer: Self-pay | Admitting: Primary Care

## 2016-09-30 ENCOUNTER — Ambulatory Visit (INDEPENDENT_AMBULATORY_CARE_PROVIDER_SITE_OTHER): Payer: BLUE CROSS/BLUE SHIELD | Admitting: Primary Care

## 2016-09-30 VITALS — BP 150/90 | HR 82 | Temp 98.1°F | Wt 175.0 lb

## 2016-09-30 DIAGNOSIS — J069 Acute upper respiratory infection, unspecified: Secondary | ICD-10-CM | POA: Diagnosis not present

## 2016-09-30 MED ORDER — BENZONATATE 200 MG PO CAPS: 200.0000 mg | ORAL_CAPSULE | Freq: Three times a day (TID) | ORAL | 0 refills | Status: DC | PRN

## 2016-09-30 MED ORDER — AZITHROMYCIN 250 MG PO TABS: ORAL_TABLET | ORAL | 0 refills | Status: DC

## 2016-09-30 NOTE — Progress Notes (Signed)
Pre visit review using our clinic review tool, if applicable. No additional management support is needed unless otherwise documented below in the visit note. 

## 2016-09-30 NOTE — Patient Instructions (Signed)
Start Azithromycin antibiotics. Take 2 tablets by mouth today, then 1 tablet daily for 4 additional days.  You may take Benzonatate capsules for cough. Take 1 capsule by mouth three times daily as needed for cough.  Ensure you are staying hydrated with water and rest.  It was a pleasure meeting you! Have a wonderful Christmas!

## 2016-09-30 NOTE — Progress Notes (Signed)
Subjective:    Patient ID: Sydney Avila, female    DOB: [birth year], 70 y.o.   MRN: [id number]  HPI  Sydney Avila is a 70 year old female with a history of hypertension and type 2 diabetes who presents today with a chief complaint of sore throat. She also reports chills, head congestion, cough, fatigue. Her symptoms began 7 days ago. She's been taking OTC sinus medication, benadryl without improvement. Her cough is mostly non productive. Overall she's starting to feel worse. 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  Review of Systems  Constitutional: Positive for chills and fatigue. Negative for fever.  HENT: Positive for congestion, sinus pressure and sore throat. Negative for ear pain and postnasal drip.   Respiratory: Positive for cough. Negative for shortness of breath.   Cardiovascular: Negative for chest pain.       Past Medical History:  Diagnosis Date  . Anxiety state, unspecified   . Chest pain, unspecified   . Fibroid   . HTN (hypertension)   . Hypercholesterolemia   . Obesity   . Type II or unspecified type diabetes mellitus without mention of complication, not stated as uncontrolled   . Unspecified hemorrhagic conditions   . Unspecified menopausal and postmenopausal disorder      Social History   Social History  . Marital status: Divorced    Spouse name: N/A  . Number of children: N/A  . Years of education: N/A   Occupational History  . Not on file.   Social History Main Topics  . Smoking status: Former Research scientist (life sciences)  . Smokeless tobacco: Never Used  . Alcohol use No     Comment: Rare  . Drug use: No  . Sexual activity: No   Other Topics Concern  . Not on file   Social History Narrative  . No narrative on file    Past Surgical History:  Procedure Laterality Date  . ABDOMINAL HYSTERECTOMY  1983   TAH BSO  . CATARACT EXTRACTION, BILATERAL    . CHOLECYSTECTOMY    . COLONOSCOPY  04/16/2014  . EYE SURGERY  05/19/2014    . FOOT SURGERY    . MOUTH SURGERY    . OOPHORECTOMY     BSO  . OVARIAN CYST SURGERY    . SALPINGOOPHORECTOMY      Family History  Problem Relation Age of Onset  . Uterine cancer Mother   . Diabetes Father   . Heart disease Father   . Diabetes Maternal Grandmother   . Diabetes Maternal Grandfather   . Early death Neg Hx   . Kidney disease Neg Hx   . Hyperlipidemia Neg Hx   . Stroke Neg Hx   . Colon cancer Neg Hx     Allergies  Allergen Reactions  . Amoxicillin     Upset GI per pt account  . Jardiance [Empagliflozin]     Hair loss and rash reported by patient  . Sulfa Antibiotics     Current Outpatient Prescriptions on File Prior to Visit  Medication Sig Dispense Refill  . ALPRAZolam (XANAX) 0.5 MG tablet TAKE 1 TABLET BY MOUTH EVERY NIGHT AT BEDTIME AS NEEDED FOR ANXIETY  (NEED FOLLOW UP WITH DR Ronnald Ramp FOR REFILLS) 30 tablet 1  . cholestyramine (QUESTRAN) 4 GM/DOSE powder Take 1 packet (4 g total) by mouth 2 (two) times daily with a meal. 378 g 12  . Estradiol 10 MCG TABS vaginal tablet Place 1 tablet (10 mcg  total) vaginally 2 (two) times a week. 8 tablet 11  . fluconazole (DIFLUCAN) 100 MG tablet Take 1 tablet (100 mg total) by mouth daily. Take one every other day 3 tablet 1  . glucose blood (ONE TOUCH ULTRA TEST) test strip Use to check blood sugar twice per day. DX E11.9 200 each 3  . JANUMET XR 50-1000 MG TB24 TAKE 2 TABLETS BY MOUTH DAILY. 180 tablet 3  . lisinopril (PRINIVIL,ZESTRIL) 10 MG tablet TAKE 1 TABLET (10 MG TOTAL) BY MOUTH DAILY. 90 tablet 3  . metroNIDAZOLE (METROGEL) 0.75 % vaginal gel Place 1 Applicatorful vaginally 2 (two) times daily. 70 g 0  . phenazopyridine (PYRIDIUM) 200 MG tablet Take 1 tablet (200 mg total) by mouth 3 (three) times daily as needed for pain. 9 tablet 0  . simvastatin (ZOCOR) 20 MG tablet TAKE 1 TABLET (20 MG TOTAL) BY MOUTH EVERY EVENING. 90 tablet 3  . tiZANidine (ZANAFLEX) 2 MG tablet      No current facility-administered  medications on file prior to visit.     BP (!) 150/90 (BP Location: Left Arm, Patient Position: Sitting, Cuff Size: Normal)   Pulse 82   Temp 98.1 F (36.7 C) (Oral)   Wt 175 lb (79.4 kg)   SpO2 96%   BMI 32.27 kg/m    Objective:   Physical Exam  Constitutional: She appears well-nourished. She appears ill.  HENT:  Right Ear: Tympanic membrane and ear canal normal.  Left Ear: Ear canal normal. Tympanic membrane is bulging. Tympanic membrane is not erythematous.  Nose: Right sinus exhibits no maxillary sinus tenderness and no frontal sinus tenderness. Left sinus exhibits no maxillary sinus tenderness and no frontal sinus tenderness.  Mouth/Throat: Oropharynx is clear and moist.  Eyes: Conjunctivae are normal.  Neck: Neck supple.  Cardiovascular: Normal rate and regular rhythm.   Pulmonary/Chest: Effort normal. She has no decreased breath sounds. She has no wheezes. She has rhonchi in the right lower field, the left upper field and the left lower field. She has no rales.  Lymphadenopathy:    She has no cervical adenopathy.  Skin: Skin is warm and dry.          Assessment & Plan:  URI:  Cough, congestion, fatigue, chills x 7 days. Starting to feel worse now. No improvement with OTC treatment. Exam today with moderate rhonchi to bilateral bases and to left upper field. She does appear ill, not toxic.  Given duration of symptoms coupled with examination will treat for presumed early bacterial involvement. Rx for Azithromycin and tessalon pearls sent to pharmacy.  Fluids, rest, follow up PRN.  Sheral Flow, NP

## 2016-10-01 ENCOUNTER — Other Ambulatory Visit: Payer: Self-pay | Admitting: Internal Medicine

## 2016-10-04 NOTE — Telephone Encounter (Signed)
Pt called and is upset that her rx for alprazolam was refused on Friday. Pt informed that an appt was needed. Pt stated that she had an appt on Saturday. I tried to explain that the URI was acute and was for her sx only and not for refill of a controlled substance.   Pt was upset and accused me of refusing it. I informed that I did not have the ability (not within my scope) to approve or denied her rx because it is a controlled substance. She stated that she knows what it is. I offered an appt this week with one of our providers for a medication refill. Pt refused. Pt scheduled for Wednesday with PCP.

## 2016-10-09 ENCOUNTER — Other Ambulatory Visit: Payer: Self-pay | Admitting: Internal Medicine

## 2016-10-09 MED ORDER — ALPRAZOLAM 0.5 MG PO TABS: ORAL_TABLET | ORAL | 5 refills | Status: DC

## 2016-10-09 NOTE — Telephone Encounter (Signed)
RX written 

## 2016-10-10 NOTE — Progress Notes (Signed)
rx faxed to [facility]

## 2016-10-11 ENCOUNTER — Other Ambulatory Visit: Payer: Self-pay | Admitting: Internal Medicine

## 2016-10-11 ENCOUNTER — Encounter: Payer: Self-pay | Admitting: Internal Medicine

## 2016-10-11 ENCOUNTER — Other Ambulatory Visit (INDEPENDENT_AMBULATORY_CARE_PROVIDER_SITE_OTHER): Payer: BLUE CROSS/BLUE SHIELD

## 2016-10-11 ENCOUNTER — Ambulatory Visit (INDEPENDENT_AMBULATORY_CARE_PROVIDER_SITE_OTHER): Payer: BLUE CROSS/BLUE SHIELD | Admitting: Internal Medicine

## 2016-10-11 VITALS — BP 146/82 | HR 76 | Temp 97.6°F | Resp 16 | Ht 61.75 in | Wt 175.2 lb

## 2016-10-11 DIAGNOSIS — E118 Type 2 diabetes mellitus with unspecified complications: Secondary | ICD-10-CM | POA: Diagnosis not present

## 2016-10-11 DIAGNOSIS — I10 Essential (primary) hypertension: Secondary | ICD-10-CM

## 2016-10-11 DIAGNOSIS — E785 Hyperlipidemia, unspecified: Secondary | ICD-10-CM

## 2016-10-11 DIAGNOSIS — K9089 Other intestinal malabsorption: Secondary | ICD-10-CM | POA: Diagnosis not present

## 2016-10-11 LAB — COMPREHENSIVE METABOLIC PANEL
ALT: 11 U/L (ref 0–35)
AST: 12 U/L (ref 0–37)
Albumin: 4.4 g/dL (ref 3.5–5.2)
Alkaline Phosphatase: 56 U/L (ref 39–117)
BILIRUBIN TOTAL: 0.9 mg/dL (ref 0.2–1.2)
BUN: 15 mg/dL (ref 6–23)
CO2: 28 meq/L (ref 19–32)
Calcium: 9.2 mg/dL (ref 8.4–10.5)
Chloride: 102 mEq/L (ref 96–112)
Creatinine, Ser: 0.88 mg/dL (ref 0.40–1.20)
GFR: 66.97 mL/min (ref >60.00)
GLUCOSE: 196 mg/dL — AB (ref 70–99)
Potassium: 4.4 mEq/L (ref 3.5–5.1)
Sodium: 138 mEq/L (ref 135–145)
Total Protein: 6.6 g/dL (ref 6.0–8.3)

## 2016-10-11 LAB — CBC WITH DIFFERENTIAL/PLATELET
BASOS PCT: 0.9 % (ref 0.0–3.0)
Basophils Absolute: 0.1 10*3/uL (ref 0.0–0.1)
EOS ABS: 0.1 10*3/uL (ref 0.0–0.7)
Eosinophils Relative: 1.8 % (ref 0.0–5.0)
HCT: 39.8 % (ref 36.0–46.0)
Hemoglobin: 13.6 g/dL (ref 12.0–15.0)
Lymphocytes Relative: 22.8 % (ref 12.0–46.0)
Lymphs Abs: 1.8 10*3/uL (ref 0.7–4.0)
MCHC: 34 g/dL (ref 30.0–36.0)
MCV: 89 fl (ref 78.0–100.0)
Monocytes Absolute: 0.5 10*3/uL (ref 0.1–1.0)
Monocytes Relative: 5.8 % (ref 3.0–12.0)
NEUTROS ABS: 5.4 10*3/uL (ref 1.4–7.7)
NEUTROS PCT: 68.7 % (ref 43.0–77.0)
PLATELETS: 234 10*3/uL (ref 150.0–400.0)
RBC: 4.48 Mil/uL (ref 3.87–5.11)
RDW: 13.4 % (ref 11.5–15.5)
WBC: 7.9 10*3/uL (ref 4.0–10.5)

## 2016-10-11 LAB — HEMOGLOBIN A1C: Hgb A1c MFr Bld: 7.4 % — ABNORMAL HIGH (ref 4.6–6.5)

## 2016-10-11 LAB — LIPID PANEL
CHOL/HDL RATIO: 3
Cholesterol: 113 mg/dL (ref 0–200)
HDL: 40.8 mg/dL (ref >39.00)
NONHDL: 71.94
TRIGLYCERIDES: 225 mg/dL — AB (ref 0.0–149.0)
VLDL: 45 mg/dL — ABNORMAL HIGH (ref 0.0–40.0)

## 2016-10-11 LAB — LDL CHOLESTEROL, DIRECT: LDL DIRECT: 47 mg/dL

## 2016-10-11 MED ORDER — CHOLESTYRAMINE 4 GM/DOSE PO POWD: 4.0000 g | Freq: Two times a day (BID) | ORAL | 3 refills | Status: DC

## 2016-10-11 NOTE — Patient Instructions (Signed)

## 2016-10-11 NOTE — Progress Notes (Signed)
Pre visit review using our clinic review tool, if applicable. No additional management support is needed unless otherwise documented below in the visit note. 

## 2016-10-11 NOTE — Progress Notes (Signed)
Subjective:  Patient ID: Sydney Avila, female    DOB: 1946/08/01  Age: 71 y.o. MRN: [id number]  CC: Hyperlipidemia; Hypertension; and Diabetes   HPI Sydney Avila presents for f/up On the above medical issues. Over the last few weeks she has had some dietary indiscretions and complains about some weight gain and blood sugars not being as well controlled as they were before. She otherwise feels well and offers no complaints.  Outpatient Medications Prior to Visit  Medication Sig Dispense Refill  . ALPRAZolam (XANAX) 0.5 MG tablet TAKE 1 TABLET BY MOUTH EVERY NIGHT AT BEDTIME AS NEEDED FOR ANXIETY 30 tablet 5  . Estradiol 10 MCG TABS vaginal tablet Place 1 tablet (10 mcg total) vaginally 2 (two) times a week. 8 tablet 11  . glucose blood (ONE TOUCH ULTRA TEST) test strip Use to check blood sugar twice per day. DX E11.9 200 each 3  . JANUMET XR 50-1000 MG TB24 TAKE 2 TABLETS BY MOUTH DAILY. 180 tablet 3  . lisinopril (PRINIVIL,ZESTRIL) 10 MG tablet TAKE 1 TABLET (10 MG TOTAL) BY MOUTH DAILY. 90 tablet 3  . simvastatin (ZOCOR) 20 MG tablet TAKE 1 TABLET (20 MG TOTAL) BY MOUTH EVERY EVENING. 90 tablet 3  . tiZANidine (ZANAFLEX) 2 MG tablet     . azithromycin (ZITHROMAX) 250 MG tablet Take 2 tablets by mouth today, then 1 tablet daily for 4 additional days. 6 tablet 0  . benzonatate (TESSALON) 200 MG capsule Take 1 capsule (200 mg total) by mouth 3 (three) times daily as needed for cough. 30 capsule 0  . cholestyramine (QUESTRAN) 4 GM/DOSE powder Take 1 packet (4 g total) by mouth 2 (two) times daily with a meal. 378 g 12  . fluconazole (DIFLUCAN) 100 MG tablet Take 1 tablet (100 mg total) by mouth daily. Take one every other day 3 tablet 1  . metroNIDAZOLE (METROGEL) 0.75 % vaginal gel Place 1 Applicatorful vaginally 2 (two) times daily. 70 g 0  . phenazopyridine (PYRIDIUM) 200 MG tablet Take 1 tablet (200 mg total) by mouth 3 (three) times daily as needed for pain. 9 tablet 0   No  facility-administered medications prior to visit.     ROS Review of Systems  Constitutional: Positive for unexpected weight change. Negative for activity change, appetite change, diaphoresis and fatigue.  HENT: Negative.   Eyes: Negative for visual disturbance.  Respiratory: Negative for cough, chest tightness, shortness of breath and wheezing.   Cardiovascular: Negative for chest pain, palpitations and leg swelling.  Gastrointestinal: Negative for abdominal pain, constipation, diarrhea, nausea and vomiting.  Endocrine: Negative for cold intolerance, heat intolerance, polydipsia, polyphagia and polyuria.  Genitourinary: Negative.  Negative for dysuria, frequency and urgency.  Musculoskeletal: Negative.  Negative for arthralgias and myalgias.  Skin: Negative.  Negative for color change and rash.  Allergic/Immunologic: Negative.   Neurological: Negative.  Negative for dizziness, weakness and light-headedness.  Hematological: Negative.  Negative for adenopathy. Does not bruise/bleed easily.  Psychiatric/Behavioral: Negative.     Objective:  BP (!) 146/82 (BP Location: Left Arm, Patient Position: Sitting, Cuff Size: Normal)   Pulse 76   Temp 97.6 F (36.4 C) (Oral)   Resp 16   Ht 5' 1.75" (1.568 m)   Wt 175 lb 4 oz (79.5 kg)   SpO2 96%   BMI 32.31 kg/m   BP Readings from Last 3 Encounters:  10/11/16 (!) 146/82  09/30/16 (!) 150/90  08/07/16 130/70    Wt Readings from Last 3 Encounters:  10/11/16 175 lb 4 oz (79.5 kg)  09/30/16 175 lb (79.4 kg)  08/07/16 177 lb 9.6 oz (80.6 kg)    Physical Exam  Constitutional: She is oriented to person, place, and time.  HENT:  Mouth/Throat: Oropharynx is clear and moist. No oropharyngeal exudate.  Eyes: Conjunctivae are normal. Right eye exhibits no discharge. Left eye exhibits no discharge. No scleral icterus.  Neck: Normal range of motion. Neck supple. No JVD present. No tracheal deviation present. No thyromegaly present.    Cardiovascular: Normal rate, regular rhythm, normal heart sounds and intact distal pulses.  Exam reveals no gallop and no friction rub.   No murmur heard. Pulmonary/Chest: Effort normal and breath sounds normal. No stridor. No respiratory distress. She has no wheezes. She has no rales. She exhibits no tenderness.  Abdominal: Soft. Bowel sounds are normal. She exhibits no distension and no mass. There is no tenderness. There is no rebound and no guarding.  Musculoskeletal: Normal range of motion. She exhibits no edema, tenderness or deformity.  Lymphadenopathy:    She has no cervical adenopathy.  Neurological: She is oriented to person, place, and time.  Skin: Skin is warm and dry. No rash noted. She is not diaphoretic. No erythema. No pallor.  Vitals reviewed.   Lab Results  Component Value Date   WBC 7.9 10/11/2016   HGB 13.6 10/11/2016   HCT 39.8 10/11/2016   PLT 234.0 10/11/2016   GLUCOSE 196 (H) 10/11/2016   CHOL 113 10/11/2016   TRIG 225.0 (H) 10/11/2016   HDL 40.80 10/11/2016   LDLDIRECT 47.0 10/11/2016   LDLCALC 30 10/27/2013   ALT 11 10/11/2016   AST 12 10/11/2016   NA 138 10/11/2016   K 4.4 10/11/2016   CL 102 10/11/2016   CREATININE 0.88 10/11/2016   BUN 15 10/11/2016   CO2 28 10/11/2016   TSH 3.04 10/27/2015   HGBA1C 7.4 (H) 10/11/2016   MICROALBUR 0.9 04/05/2016    Mm Digital Screening Bilateral  Result Date: 08/21/2016 CLINICAL DATA:  Screening. EXAM: DIGITAL SCREENING BILATERAL MAMMOGRAM WITH CAD COMPARISON:  Previous exam(s). ACR Breast Density Category b: There are scattered areas of fibroglandular density. FINDINGS: There are no findings suspicious for malignancy. Images were processed with CAD. IMPRESSION: No mammographic evidence of malignancy. A result letter of this screening mammogram will be mailed directly to the patient. RECOMMENDATION: Screening mammogram in one year. (Code:SM-B-01Y) BI-RADS CATEGORY  1: Negative. Electronically Signed   By:  Ammie Ferrier M.D.   On: 08/21/2016 16:26    Assessment & Plan:   Sydney Avila was seen today for hyperlipidemia, hypertension and diabetes.  Diagnoses and all orders for this visit:  Essential hypertension, benign- her blood pressure is well controlled, electrolytes and renal function are normal -     Comprehensive metabolic panel; Future -     CBC with Differential/Platelet; Future  Type 2 diabetes mellitus with complication, without long-term current use of insulin ([facility])- her A1c is up to 7.4%, this is not high enough to add another medication for blood sugar control but I have encouraged to be more diligent with her lifestyle modifications. -     Comprehensive metabolic panel; Future -     Hemoglobin A1c; Future  Hyperlipidemia with target LDL less than 100- she has achieved her LDL goal is doing well on the statin -     Lipid panel; Future  Bile salt-induced diarrhea- her symptoms are well controlled with Questran -     cholestyramine (QUESTRAN) 4 GM/DOSE powder;  Take 1 packet (4 g total) by mouth 2 (two) times daily with a meal.   I have discontinued Ms. Puebla's phenazopyridine, metroNIDAZOLE, fluconazole, azithromycin, and benzonatate. I am also having her maintain her lisinopril, JANUMET XR, tiZANidine, Estradiol, glucose blood, simvastatin, ALPRAZolam, and cholestyramine.  Meds ordered this encounter  Medications  . cholestyramine (QUESTRAN) 4 GM/DOSE powder    Sig: Take 1 packet (4 g total) by mouth 2 (two) times daily with a meal.    Dispense:  1134 g    Refill:  3     Follow-up: Return in about 6 months (around 04/10/2017).  [name], MD

## 2016-10-12 ENCOUNTER — Encounter: Payer: Self-pay | Admitting: Internal Medicine

## 2016-11-09 ENCOUNTER — Other Ambulatory Visit (INDEPENDENT_AMBULATORY_CARE_PROVIDER_SITE_OTHER): Payer: BLUE CROSS/BLUE SHIELD

## 2016-11-09 ENCOUNTER — Ambulatory Visit (INDEPENDENT_AMBULATORY_CARE_PROVIDER_SITE_OTHER): Payer: BLUE CROSS/BLUE SHIELD | Admitting: Internal Medicine

## 2016-11-09 ENCOUNTER — Ambulatory Visit (INDEPENDENT_AMBULATORY_CARE_PROVIDER_SITE_OTHER)
Admission: RE | Admit: 2016-11-09 | Discharge: 2016-11-09 | Disposition: A | Discharge status: Home / Self Care | Payer: BLUE CROSS/BLUE SHIELD | Source: Ambulatory Visit | Attending: Internal Medicine | Admitting: Internal Medicine

## 2016-11-09 ENCOUNTER — Encounter: Payer: Self-pay | Admitting: Internal Medicine

## 2016-11-09 ENCOUNTER — Telehealth: Payer: Self-pay | Admitting: Emergency Medicine

## 2016-11-09 VITALS — BP 180/90 | HR 70 | Temp 97.7°F | Ht 61.75 in | Wt 175.0 lb

## 2016-11-09 DIAGNOSIS — I10 Essential (primary) hypertension: Secondary | ICD-10-CM

## 2016-11-09 DIAGNOSIS — E118 Type 2 diabetes mellitus with unspecified complications: Secondary | ICD-10-CM

## 2016-11-09 DIAGNOSIS — R748 Abnormal levels of other serum enzymes: Secondary | ICD-10-CM

## 2016-11-09 DIAGNOSIS — R0781 Pleurodynia: Secondary | ICD-10-CM

## 2016-11-09 DIAGNOSIS — R10812 Left upper quadrant abdominal tenderness: Secondary | ICD-10-CM

## 2016-11-09 LAB — CBC WITH DIFFERENTIAL/PLATELET
BASOS ABS: 0.1 10*3/uL (ref 0.0–0.1)
Basophils Relative: 1 % (ref 0.0–3.0)
EOS PCT: 1.5 % (ref 0.0–5.0)
Eosinophils Absolute: 0.1 10*3/uL (ref 0.0–0.7)
HEMATOCRIT: 40.2 % (ref 36.0–46.0)
Hemoglobin: 13.6 g/dL (ref 12.0–15.0)
LYMPHS PCT: 24.6 % (ref 12.0–46.0)
Lymphs Abs: 1.7 10*3/uL (ref 0.7–4.0)
MCHC: 33.8 g/dL (ref 30.0–36.0)
MCV: 90.2 fl (ref 78.0–100.0)
MONOS PCT: 6.7 % (ref 3.0–12.0)
Monocytes Absolute: 0.5 10*3/uL (ref 0.1–1.0)
Neutro Abs: 4.7 10*3/uL (ref 1.4–7.7)
Neutrophils Relative %: 66.2 % (ref 43.0–77.0)
PLATELETS: 213 10*3/uL (ref 150.0–400.0)
RBC: 4.46 Mil/uL (ref 3.87–5.11)
RDW: 13.2 % (ref 11.5–15.5)
WBC: 7.1 10*3/uL (ref 4.0–10.5)

## 2016-11-09 LAB — COMPREHENSIVE METABOLIC PANEL
ALT: 13 U/L (ref 0–35)
AST: <3 U/L (ref 0–37)
Albumin: 4.5 g/dL (ref 3.5–5.2)
Alkaline Phosphatase: 60 U/L (ref 39–117)
BUN: 15 mg/dL (ref 6–23)
CO2: 30 mEq/L (ref 19–32)
Calcium: 9.2 mg/dL (ref 8.4–10.5)
Chloride: 102 mEq/L (ref 96–112)
Creatinine, Ser: 0.89 mg/dL (ref 0.40–1.20)
GFR: 66.09 mL/min (ref >60.00)
Glucose, Bld: 182 mg/dL — ABNORMAL HIGH (ref 70–99)
Potassium: 4.5 mEq/L (ref 3.5–5.1)
Sodium: 138 mEq/L (ref 135–145)
Total Bilirubin: 1.1 mg/dL (ref 0.2–1.2)
Total Protein: 7 g/dL (ref 6.0–8.3)

## 2016-11-09 LAB — URINALYSIS, ROUTINE W REFLEX MICROSCOPIC
BILIRUBIN URINE: NEGATIVE
HGB URINE DIPSTICK: NEGATIVE
Ketones, ur: NEGATIVE
NITRITE: NEGATIVE
Specific Gravity, Urine: <=1.005 — AB (ref 1.000–1.030)
Total Protein, Urine: NEGATIVE
URINE GLUCOSE: NEGATIVE
Urobilinogen, UA: 0.2 (ref 0.0–1.0)
pH: 6 (ref 5.0–8.0)

## 2016-11-09 LAB — AMYLASE: Amylase: 25 U/L — ABNORMAL LOW (ref 27–131)

## 2016-11-09 LAB — D-DIMER, QUANTITATIVE: D-Dimer, Quant: 0.88 mcg/mL FEU — ABNORMAL HIGH (ref <0.50)

## 2016-11-09 LAB — LIPASE: Lipase: 90 U/L — ABNORMAL HIGH (ref 11.0–59.0)

## 2016-11-09 MED ORDER — TELMISARTAN-AMLODIPINE 80-5 MG PO TABS: 1.0000 | ORAL_TABLET | Freq: Every day | ORAL | 1 refills | Status: DC

## 2016-11-09 NOTE — Telephone Encounter (Signed)
ok 

## 2016-11-09 NOTE — Progress Notes (Signed)
Pre visit review using our clinic review tool, if applicable. No additional management support is needed unless otherwise documented below in the visit note. 

## 2016-11-09 NOTE — Progress Notes (Signed)
Subjective:  Patient ID: Sydney Avila, female    DOB: 1945-11-17  Age: 71 y.o. MRN: [id number]  CC: Abdominal Pain   HPI MIRIANA TEUBER presents for A 5 day history of pleuritis left lower rib cage and left upper quadrant pain. She describes the pain as sharp and intermittent and says it radiates to her left shoulder blade. She tells me the symptoms are relieved by belching but she denies odynophagia or dysphagia. She is s/p [facility] repair many years ago. She has also had a mild nonproductive cough and loss of appetite but she denies nausea or vomiting. She has not noticed a rash, diarrhea, constipation, dysuria, or hematuria.  Outpatient Medications Prior to Visit  Medication Sig Dispense Refill  . ALPRAZolam (XANAX) 0.5 MG tablet TAKE 1 TABLET BY MOUTH EVERY NIGHT AT BEDTIME AS NEEDED FOR ANXIETY 30 tablet 5  . cholestyramine (QUESTRAN) 4 GM/DOSE powder Take 1 packet (4 g total) by mouth 2 (two) times daily with a meal. 1134 g 3  . glucose blood (ONE TOUCH ULTRA TEST) test strip Use to check blood sugar twice per day. DX E11.9 200 each 3  . JANUMET XR 50-1000 MG TB24 TAKE 2 TABLETS BY MOUTH DAILY. 180 tablet 3  . simvastatin (ZOCOR) 20 MG tablet TAKE 1 TABLET (20 MG TOTAL) BY MOUTH EVERY EVENING. 90 tablet 3  . tiZANidine (ZANAFLEX) 2 MG tablet     . lisinopril (PRINIVIL,ZESTRIL) 10 MG tablet TAKE 1 TABLET (10 MG TOTAL) BY MOUTH DAILY. 90 tablet 3  . Estradiol 10 MCG TABS vaginal tablet Place 1 tablet (10 mcg total) vaginally 2 (two) times a week. 8 tablet 11   No facility-administered medications prior to visit.     ROS Review of Systems  Constitutional: Positive for appetite change. Negative for chills, diaphoresis, fatigue and fever.  HENT: Negative for trouble swallowing.   Eyes: Negative for visual disturbance.  Respiratory: Positive for cough. Negative for chest tightness, shortness of breath, wheezing and stridor.   Cardiovascular: Negative for chest pain, palpitations and leg  swelling.  Gastrointestinal: Positive for abdominal pain. Negative for abdominal distention, constipation, diarrhea, nausea and vomiting.  Endocrine: Negative.   Genitourinary: Negative.  Negative for decreased urine volume, difficulty urinating, dysuria, flank pain, hematuria and urgency.  Musculoskeletal: Negative.  Negative for back pain.  Skin: Negative.   Allergic/Immunologic: Negative.   Neurological: Negative.  Negative for dizziness, weakness and light-headedness.  Hematological: Negative for adenopathy. Does not bruise/bleed easily.  Psychiatric/Behavioral: Negative for decreased concentration, dysphoric mood, self-injury and suicidal ideas. The patient is nervous/anxious.     Objective:  BP (!) 180/90 (BP Location: Left Arm, Patient Position: Sitting, Cuff Size: Normal)   Pulse 70   Temp 97.7 F (36.5 C) (Oral)   Ht 5' 1.75" (1.568 m)   Wt 175 lb (79.4 kg)   SpO2 99%   BMI 32.27 kg/m   BP Readings from Last 3 Encounters:  11/09/16 (!) 180/90  10/11/16 (!) 146/82  09/30/16 (!) 150/90    Wt Readings from Last 3 Encounters:  11/09/16 175 lb (79.4 kg)  10/11/16 175 lb 4 oz (79.5 kg)  09/30/16 175 lb (79.4 kg)    Physical Exam  Constitutional: She is oriented to person, place, and time.  Non-toxic appearance. She does not have a sickly appearance. She does not appear ill. She appears distressed.  HENT:  Mouth/Throat: Oropharynx is clear and moist. No oropharyngeal exudate.  Eyes: Conjunctivae are normal. Right eye exhibits no discharge.  Left eye exhibits no discharge. No scleral icterus.  Neck: Normal range of motion. Neck supple. No JVD present. No tracheal deviation present. No thyromegaly present.  Cardiovascular: Normal rate, regular rhythm, normal heart sounds and intact distal pulses.  Exam reveals no gallop and no friction rub.   No murmur heard. Pulmonary/Chest: Effort normal and breath sounds normal. No stridor. No respiratory distress. She has no wheezes. She  has no rales. She exhibits no tenderness.  Abdominal: Soft. Normal appearance and bowel sounds are normal. She exhibits no distension and no mass. There is no hepatosplenomegaly, splenomegaly or hepatomegaly. There is tenderness in the left upper quadrant. There is no rigidity, no rebound, no guarding, no CVA tenderness, no tenderness at McBurney's point and negative Murphy's sign.  Musculoskeletal: Normal range of motion. She exhibits no edema, tenderness or deformity.  Lymphadenopathy:    She has no cervical adenopathy.  Neurological: She is oriented to person, place, and time.  Skin: Skin is warm and dry. No rash noted. She is not diaphoretic. No erythema. No pallor.  Vitals reviewed.   Lab Results  Component Value Date   WBC 7.1 11/09/2016   HGB 13.6 11/09/2016   HCT 40.2 11/09/2016   PLT 213.0 11/09/2016   GLUCOSE 182 (H) 11/09/2016   CHOL 113 10/11/2016   TRIG 225.0 (H) 10/11/2016   HDL 40.80 10/11/2016   LDLDIRECT 47.0 10/11/2016   LDLCALC 30 10/27/2013   ALT 13 11/09/2016   AST <3 11/09/2016   NA 138 11/09/2016   K 4.5 11/09/2016   CL 102 11/09/2016   CREATININE 0.89 11/09/2016   BUN 15 11/09/2016   CO2 30 11/09/2016   TSH 3.04 10/27/2015   HGBA1C 7.4 (H) 10/11/2016   MICROALBUR 0.9 04/05/2016    Dg Abd Acute W/chest  Result Date: 11/09/2016 CLINICAL DATA:  Left pleuritic chest pain and left upper quadrant pain for the past week with no other complaints. History of hypertension, hyperlipidemia, obesity, former smoker. EXAM: DG ABDOMEN ACUTE W/ 1V CHEST COMPARISON:  Chest x-ray of April 30, 2013 FINDINGS: The lungs are well-expanded. There is no focal infiltrate. There is no pleural effusion. There is a stable 5 mm calcified nodule projecting in the left lower lung. The heart and pulmonary vascularity are normal. The mediastinum is normal in width. Within the abdomen the colonic stool burden is moderately increased. The small and large bowel gas pattern is normal. There is  stool in the rectum. There surgical clips in the gallbladder fossa and in the left upper quadrant as well as bilaterally in the pelvis. No kidney stones are observed. The bony structures exhibit no acute abnormalities. IMPRESSION: No acute cardiopulmonary abnormality. Evidence of previous granulomatous infection, stable. Increased colonic stool burden. No small or large bowel obstructive pattern. No evidence of perforation. Electronically Signed   By: David  Martinique M.D.   On: 11/09/2016 10:51    Assessment & Plan:   Janemarie was seen today for abdominal pain.  Diagnoses and all orders for this visit:  Essential hypertension, benign- her blood pressure is not adequately well controlled on ACE inhibitor so I have upgraded her to an ARB/CCB combination. -     Urinalysis, Routine w reflex microscopic; Future -     Telmisartan-Amlodipine 80-5 MG TABS; Take 1 tablet by mouth daily.  Type 2 diabetes mellitus with complication, without long-term current use of insulin (HCC) -     Telmisartan-Amlodipine 80-5 MG TABS; Take 1 tablet by mouth daily.  Pleuritic chest pain- her  chest x-ray is normal and her d-dimer is not significantly elevated so I do not think she has a pulmonary embolus and therefore will not scan her chest. -     D-dimer, quantitative (not at [facility]); Future -     DG Abd Acute W/Chest; Future  Left upper quadrant abdominal tenderness without rebound tenderness- plain films of the chest and abdomen are remarkable only for constipation. Her labs are normal with the exception of a mildly elevated lipase. The labs do not indicate intestinal ischemia, liver pathology, spleen pathology, or renal pathology. I've ordered a CT of the abdomen and pelvis with contrast to see if she is having a complication from a hiatal hernia repair, mass in her spleen, splenic infarct, or mesenteric ischemia. -     Comprehensive metabolic panel; Future -     CBC with Differential/Platelet; Future -     Lipase;  Future -     Amylase; Future -     Urinalysis, Routine w reflex microscopic; Future -     DG Abd Acute W/Chest; Future   I have discontinued Ms. Maclellan's lisinopril. I am also having her start on Telmisartan-Amlodipine. Additionally, I am having her maintain her JANUMET XR, tiZANidine, glucose blood, simvastatin, ALPRAZolam, cholestyramine, fluocinonide ointment, and estradiol.  Meds ordered this encounter  Medications  . fluocinonide ointment (LIDEX) 0.05 %  . estradiol (CLIMARA) 0.06 MG/24HR    Sig: Place 1 patch onto the skin once a week.  . Telmisartan-Amlodipine 80-5 MG TABS    Sig: Take 1 tablet by mouth daily.    Dispense:  90 tablet    Refill:  1     Follow-up: Return in about 1 day (around 11/10/2016).  [name], MD

## 2016-11-09 NOTE — Patient Instructions (Signed)

## 2016-11-09 NOTE — Telephone Encounter (Signed)
[facility] called with STAT labs for D-dimer 0.88

## 2016-11-10 ENCOUNTER — Encounter: Payer: Self-pay | Admitting: Internal Medicine

## 2016-11-10 NOTE — Telephone Encounter (Signed)
Patient is requesting rx: Halbetasol Ointment. Please advise.

## 2016-11-13 ENCOUNTER — Ambulatory Visit (INDEPENDENT_AMBULATORY_CARE_PROVIDER_SITE_OTHER)
Admission: RE | Admit: 2016-11-13 | Discharge: 2016-11-13 | Disposition: A | Discharge status: Home / Self Care | Payer: BLUE CROSS/BLUE SHIELD | Source: Ambulatory Visit | Attending: Internal Medicine | Admitting: Internal Medicine

## 2016-11-13 ENCOUNTER — Inpatient Hospital Stay: Admission: RE | Admit: 2016-11-13 | Payer: BLUE CROSS/BLUE SHIELD | Source: Ambulatory Visit

## 2016-11-13 ENCOUNTER — Encounter: Payer: Self-pay | Admitting: Internal Medicine

## 2016-11-13 DIAGNOSIS — R748 Abnormal levels of other serum enzymes: Secondary | ICD-10-CM | POA: Diagnosis not present

## 2016-11-13 DIAGNOSIS — R10812 Left upper quadrant abdominal tenderness: Secondary | ICD-10-CM

## 2016-11-13 DIAGNOSIS — R0781 Pleurodynia: Secondary | ICD-10-CM | POA: Diagnosis not present

## 2016-11-13 MED ORDER — IOPAMIDOL (ISOVUE-300) INJECTION 61%
100.0000 mL | Freq: Once | INTRAVENOUS | Status: AC | PRN
  Administered 2016-11-13: 100 mL via INTRAVENOUS

## 2016-11-28 ENCOUNTER — Other Ambulatory Visit: Payer: Self-pay | Admitting: *Deleted

## 2016-11-28 MED ORDER — ESTRADIOL 0.06 MG/24HR TD PTWK: 1.0000 | MEDICATED_PATCH | TRANSDERMAL | 1 refills | Status: DC

## 2016-11-28 NOTE — Telephone Encounter (Signed)
rec'd call pt states she saw MD couple weeks ago. Requested a refill on her climara, but only received the cholestyramine. Needing rx sent to [facility] for the climara. Sent electronically...Johny Chess

## 2016-12-03 ENCOUNTER — Other Ambulatory Visit: Payer: Self-pay | Admitting: Internal Medicine

## 2017-01-15 ENCOUNTER — Other Ambulatory Visit (INDEPENDENT_AMBULATORY_CARE_PROVIDER_SITE_OTHER): Payer: BLUE CROSS/BLUE SHIELD

## 2017-01-15 ENCOUNTER — Ambulatory Visit (INDEPENDENT_AMBULATORY_CARE_PROVIDER_SITE_OTHER): Payer: BLUE CROSS/BLUE SHIELD | Admitting: Internal Medicine

## 2017-01-15 ENCOUNTER — Encounter: Payer: Self-pay | Admitting: Internal Medicine

## 2017-01-15 VITALS — BP 130/70 | HR 79 | Temp 98.4°F | Resp 16 | Ht 61.75 in | Wt 178.5 lb

## 2017-01-15 DIAGNOSIS — E2839 Other primary ovarian failure: Secondary | ICD-10-CM

## 2017-01-15 DIAGNOSIS — E118 Type 2 diabetes mellitus with unspecified complications: Secondary | ICD-10-CM | POA: Diagnosis not present

## 2017-01-15 DIAGNOSIS — I10 Essential (primary) hypertension: Secondary | ICD-10-CM

## 2017-01-15 LAB — BASIC METABOLIC PANEL
BUN: 16 mg/dL (ref 6–23)
CHLORIDE: 103 meq/L (ref 96–112)
CO2: 26 mEq/L (ref 19–32)
CREATININE: 0.84 mg/dL (ref 0.40–1.20)
Calcium: 9 mg/dL (ref 8.4–10.5)
GFR: 70.61 mL/min (ref >60.00)
GLUCOSE: 283 mg/dL — AB (ref 70–99)
POTASSIUM: 4 meq/L (ref 3.5–5.1)
Sodium: 137 mEq/L (ref 135–145)

## 2017-01-15 LAB — HEMOGLOBIN A1C: Hgb A1c MFr Bld: 8.8 % — ABNORMAL HIGH (ref 4.6–6.5)

## 2017-01-15 NOTE — Progress Notes (Signed)
Pre visit review using our clinic review tool, if applicable. No additional management support is needed unless otherwise documented below in the visit note. 

## 2017-01-15 NOTE — Patient Instructions (Signed)

## 2017-01-15 NOTE — Progress Notes (Signed)
Subjective:  Patient ID: Sydney Avila, female    DOB: 02/13/1946  Age: 71 y.o. MRN: [id number]  CC: Hypertension and Diabetes   HPI Sydney Avila presents for Follow-up- she is concerned about weight gain and thinks her blood sugars may not be very well controlled. She has a relative who has used to GLP-1 agonist and has had a good response with respect to weight loss and blood sugar control and she thinks she wants to make a change.  Outpatient Medications Prior to Visit  Medication Sig Dispense Refill  . ALPRAZolam (XANAX) 0.5 MG tablet TAKE 1 TABLET BY MOUTH EVERY NIGHT AT BEDTIME AS NEEDED FOR ANXIETY 30 tablet 5  . cholestyramine (QUESTRAN) 4 GM/DOSE powder Take 1 packet (4 g total) by mouth 2 (two) times daily with a meal. 1134 g 3  . estradiol (CLIMARA) 0.06 MG/24HR Place 1 patch onto the skin once a week. 12 patch 1  . fluocinonide ointment (LIDEX) 0.05 %     . glucose blood (ONE TOUCH ULTRA TEST) test strip Use to check blood sugar twice per day. DX E11.9 200 each 3  . simvastatin (ZOCOR) 20 MG tablet TAKE 1 TABLET (20 MG TOTAL) BY MOUTH EVERY EVENING. 90 tablet 3  . Telmisartan-Amlodipine 80-5 MG TABS Take 1 tablet by mouth daily. 90 tablet 1  . tiZANidine (ZANAFLEX) 2 MG tablet     . JANUMET XR 50-1000 MG TB24 TAKE 2 TABLETS BY MOUTH DAILY. 180 tablet 1   No facility-administered medications prior to visit.     ROS Review of Systems  Constitutional: Positive for unexpected weight change. Negative for chills, diaphoresis and fatigue.  HENT: Negative.  Negative for sinus pressure and trouble swallowing.   Eyes: Negative.  Negative for visual disturbance.  Respiratory: Negative.  Negative for cough, chest tightness, shortness of breath and wheezing.   Cardiovascular: Negative for chest pain, palpitations and leg swelling.  Gastrointestinal: Negative for abdominal pain, constipation, diarrhea, nausea and vomiting.  Endocrine: Negative.  Negative for cold intolerance, heat  intolerance, polydipsia, polyphagia and polyuria.  Genitourinary: Negative.   Musculoskeletal: Negative.  Negative for back pain and myalgias.  Skin: Negative.   Allergic/Immunologic: Negative.   Neurological: Negative.  Negative for dizziness, weakness and light-headedness.  Hematological: Negative for adenopathy. Does not bruise/bleed easily.  Psychiatric/Behavioral: Negative.     Objective:  BP 130/70 (BP Location: Left Arm, Patient Position: Sitting, Cuff Size: Normal)   Pulse 79   Temp 98.4 F (36.9 C) (Oral)   Resp 16   Ht 5' 1.75" (1.568 m)   Wt 178 lb 8 oz (81 kg)   SpO2 98%   BMI 32.91 kg/m   BP Readings from Last 3 Encounters:  01/15/17 130/70  11/09/16 (!) 180/90  10/11/16 (!) 146/82    Wt Readings from Last 3 Encounters:  01/15/17 178 lb 8 oz (81 kg)  11/09/16 175 lb (79.4 kg)  10/11/16 175 lb 4 oz (79.5 kg)    Physical Exam  Constitutional: She is oriented to person, place, and time. No distress.  HENT:  Mouth/Throat: Oropharynx is clear and moist. No oropharyngeal exudate.  Eyes: Conjunctivae are normal. Right eye exhibits no discharge. Left eye exhibits no discharge. No scleral icterus.  Neck: Normal range of motion. Neck supple. No JVD present. No tracheal deviation present. No thyromegaly present.  Cardiovascular: Normal rate, regular rhythm, normal heart sounds and intact distal pulses.  Exam reveals no gallop and no friction rub.   No murmur  heard. Pulmonary/Chest: Effort normal and breath sounds normal. No stridor. No respiratory distress. She has no wheezes. She has no rales. She exhibits no tenderness.  Abdominal: Soft. Bowel sounds are normal. She exhibits no distension and no mass. There is no tenderness. There is no rebound and no guarding.  Musculoskeletal: She exhibits no edema, tenderness or deformity.  Lymphadenopathy:    She has no cervical adenopathy.  Neurological: She is oriented to person, place, and time.  Skin: Skin is warm and dry.  No rash noted. She is not diaphoretic. No erythema. No pallor.  Vitals reviewed.   Lab Results  Component Value Date   WBC 7.1 11/09/2016   HGB 13.6 11/09/2016   HCT 40.2 11/09/2016   PLT 213.0 11/09/2016   GLUCOSE 283 (H) 01/15/2017   CHOL 113 10/11/2016   TRIG 225.0 (H) 10/11/2016   HDL 40.80 10/11/2016   LDLDIRECT 47.0 10/11/2016   LDLCALC 30 10/27/2013   ALT 13 11/09/2016   AST <3 11/09/2016   NA 137 01/15/2017   K 4.0 01/15/2017   CL 103 01/15/2017   CREATININE 0.84 01/15/2017   BUN 16 01/15/2017   CO2 26 01/15/2017   TSH 3.04 10/27/2015   HGBA1C 8.8 (H) 01/15/2017   MICROALBUR 0.9 04/05/2016    Ct Abdomen Pelvis W Contrast  Result Date: 11/13/2016 CLINICAL DATA:  71 year old diabetic hypertensive female with left upper quadrant pain and tenderness for 4 days. Slightly elevated lipase. Subsequent encounter. EXAM: CT ABDOMEN AND PELVIS WITH CONTRAST TECHNIQUE: Multidetector CT imaging of the abdomen and pelvis was performed using the standard protocol following bolus administration of intravenous contrast. CONTRAST:  166mL ISOVUE-300 IOPAMIDOL (ISOVUE-300) INJECTION 61% COMPARISON:  11/09/2016 plain film exam. FINDINGS: Lower chest: Calcified granuloma left lung base. Heart size top-normal. Coronary artery calcifications. Hepatobiliary: Fatty infiltration of top-normal size liver without focal hepatic lesion. Post cholecystectomy. No calcified common bile duct stone. Pancreas: No mass or inflammation. Spleen: Accessory splenic tissue without mass or enlargement. Adrenals/Urinary Tract: No hydronephrosis. No renal mass or adrenal lesion. Stomach/Bowel: Post hiatal hernia repair with surgical clips and mild thickening in this region. 3.6 cm air-fluid collection appears be a large diverticula of jejunum. Perforation with walled-off abscess felt to be less likely consideration. Scattered diverticula most notable sigmoid colon. Question mild inflammation of single diverticula of the  sigmoid colon? Vascular/Lymphatic: Atherosclerotic changes of the aorta without aneurysm or large vessel occlusion. No adenopathy. Reproductive: Post hysterectomy and oophorectomy with surgical clips in place. Other: Negative Musculoskeletal: Degenerative changes most notable lower lumbar spine. No osseous destructive lesion. IMPRESSION: No CT evidence of pancreatitis. 3.6 cm air-fluid collection appears to be a large diverticula of jejunum. Perforation with walled-off abscess felt to be less likely consideration. Question mild inflammation of a single diverticula of the sigmoid colon? Post cholecystectomy.  No calcified common bile duct stone. Fatty liver. Coronary artery calcifications. Aortic atherosclerosis. Post hiatal hernia attempted repair. Post hysterectomy and oophorectomy. These results will be called to the ordering clinician or representative by the Radiologist Assistant, and communication documented in the PACS or zVision Dashboard. Electronically Signed   By: Genia Del M.D.   On: 11/13/2016 15:01    Assessment & Plan:   Sydney Avila was seen today for hypertension and diabetes.  Diagnoses and all orders for this visit:  Essential hypertension, benign- her blood pressure is adequately well controlled, electrolytes and renal function are normal. -     Basic metabolic panel; Future  Type 2 diabetes mellitus with complication, without long-term  current use of insulin ([place])- her A1c is up to 8.8% so I've asked her to upgrade from a DPP4 inhibitor to a GLP-1 agonist and will continue metformin -     Basic metabolic panel; Future -     Hemoglobin A1c; Future -     Dulaglutide (TRULICITY) 2.25 JD/0.5XG SOPN; Inject 1 Act into the skin once a week. -     metFORMIN (GLUCOPHAGE) 1000 MG tablet; Take 1 tablet (1,000 mg total) by mouth 2 (two) times daily with a meal.  Estrogen deficiency- she is due for DEXA scan -     DG Bone Density; Future   I have discontinued Ms. Champoux's JANUMET XR. I am  also having her start on Dulaglutide and metFORMIN. Additionally, I am having her maintain her tiZANidine, glucose blood, simvastatin, ALPRAZolam, cholestyramine, fluocinonide ointment, Telmisartan-Amlodipine, and estradiol.  Meds ordered this encounter  Medications  . Dulaglutide (TRULICITY) 3.35 OI/5.1GF SOPN    Sig: Inject 1 Act into the skin once a week.    Dispense:  12 pen    Refill:  0  . metFORMIN (GLUCOPHAGE) 1000 MG tablet    Sig: Take 1 tablet (1,000 mg total) by mouth 2 (two) times daily with a meal.    Dispense:  180 tablet    Refill:  1     Follow-up: Return in about 6 months (around 07/17/2017).  [name], MD

## 2017-01-16 ENCOUNTER — Encounter: Payer: Self-pay | Admitting: Internal Medicine

## 2017-01-16 MED ORDER — DULAGLUTIDE 0.75 MG/0.5ML ~~LOC~~ SOAJ: 1.0000 | SUBCUTANEOUS | 0 refills | Status: DC

## 2017-01-16 MED ORDER — METFORMIN HCL 1000 MG PO TABS: 1000.0000 mg | ORAL_TABLET | Freq: Two times a day (BID) | ORAL | 1 refills | Status: DC

## 2017-02-21 ENCOUNTER — Encounter: Payer: Self-pay | Admitting: Gynecology

## 2017-02-26 ENCOUNTER — Other Ambulatory Visit: Payer: Self-pay | Admitting: Internal Medicine

## 2017-02-26 DIAGNOSIS — K9089 Other intestinal malabsorption: Secondary | ICD-10-CM

## 2017-03-28 ENCOUNTER — Other Ambulatory Visit: Payer: Self-pay | Admitting: Internal Medicine

## 2017-03-30 ENCOUNTER — Other Ambulatory Visit: Payer: Self-pay | Admitting: Internal Medicine

## 2017-03-31 ENCOUNTER — Other Ambulatory Visit: Payer: Self-pay | Admitting: Internal Medicine

## 2017-04-09 ENCOUNTER — Other Ambulatory Visit: Payer: Self-pay | Admitting: Internal Medicine

## 2017-04-09 ENCOUNTER — Encounter: Payer: Self-pay | Admitting: Internal Medicine

## 2017-04-09 DIAGNOSIS — E118 Type 2 diabetes mellitus with unspecified complications: Secondary | ICD-10-CM

## 2017-04-09 MED ORDER — METFORMIN HCL ER (MOD) 1000 MG PO TB24: 1000.0000 mg | ORAL_TABLET | Freq: Two times a day (BID) | ORAL | 1 refills | Status: DC

## 2017-04-12 ENCOUNTER — Telehealth: Payer: Self-pay | Admitting: Internal Medicine

## 2017-04-12 ENCOUNTER — Other Ambulatory Visit: Payer: Self-pay | Admitting: Internal Medicine

## 2017-04-12 DIAGNOSIS — E118 Type 2 diabetes mellitus with unspecified complications: Secondary | ICD-10-CM

## 2017-04-12 MED ORDER — METFORMIN HCL ER 500 MG PO TB24: 1000.0000 mg | ORAL_TABLET | Freq: Every day | ORAL | 1 refills | Status: DC

## 2017-04-12 NOTE — Telephone Encounter (Signed)
Pharmacists states this metFORMIN (GLUMETZA) 1000 MG (MOD) 24 hr tablet  medication is $800 dollars for the pt They would like to know if it can be changed to XR 500MG  2x per day for a much better price.

## 2017-04-13 ENCOUNTER — Encounter: Payer: Self-pay | Admitting: Internal Medicine

## 2017-04-16 ENCOUNTER — Other Ambulatory Visit: Payer: Self-pay | Admitting: Internal Medicine

## 2017-04-16 DIAGNOSIS — E118 Type 2 diabetes mellitus with unspecified complications: Secondary | ICD-10-CM

## 2017-04-16 MED ORDER — METFORMIN HCL ER 500 MG PO TB24: 1000.0000 mg | ORAL_TABLET | Freq: Two times a day (BID) | ORAL | 1 refills | Status: DC

## 2017-04-27 ENCOUNTER — Encounter: Payer: Self-pay | Admitting: Internal Medicine

## 2017-04-27 ENCOUNTER — Other Ambulatory Visit: Payer: Self-pay | Admitting: Internal Medicine

## 2017-04-27 DIAGNOSIS — E118 Type 2 diabetes mellitus with unspecified complications: Secondary | ICD-10-CM

## 2017-04-27 DIAGNOSIS — I10 Essential (primary) hypertension: Secondary | ICD-10-CM

## 2017-04-27 MED ORDER — LOSARTAN POTASSIUM 100 MG PO TABS: 100.0000 mg | ORAL_TABLET | Freq: Every day | ORAL | 3 refills | Status: DC

## 2017-04-27 MED ORDER — AMLODIPINE BESYLATE 5 MG PO TABS: 5.0000 mg | ORAL_TABLET | Freq: Every day | ORAL | 3 refills | Status: DC

## 2017-04-29 ENCOUNTER — Other Ambulatory Visit: Payer: Self-pay | Admitting: Internal Medicine

## 2017-04-29 DIAGNOSIS — E118 Type 2 diabetes mellitus with unspecified complications: Secondary | ICD-10-CM

## 2017-04-30 ENCOUNTER — Other Ambulatory Visit: Payer: Self-pay | Admitting: Internal Medicine

## 2017-04-30 DIAGNOSIS — E2839 Other primary ovarian failure: Secondary | ICD-10-CM

## 2017-04-30 MED ORDER — ESTRADIOL 0.06 MG/24HR TD PTWK
1.0000 | MEDICATED_PATCH | TRANSDERMAL | 1 refills | Status: DC
Start: 2017-04-30 — End: 2017-07-26

## 2017-05-02 ENCOUNTER — Encounter: Payer: Self-pay | Admitting: Internal Medicine

## 2017-05-06 ENCOUNTER — Other Ambulatory Visit: Payer: Self-pay | Admitting: Internal Medicine

## 2017-05-06 DIAGNOSIS — E118 Type 2 diabetes mellitus with unspecified complications: Secondary | ICD-10-CM

## 2017-05-06 DIAGNOSIS — I10 Essential (primary) hypertension: Secondary | ICD-10-CM

## 2017-05-23 DIAGNOSIS — L218 Other seborrheic dermatitis: Secondary | ICD-10-CM | POA: Diagnosis not present

## 2017-05-23 DIAGNOSIS — L820 Inflamed seborrheic keratosis: Secondary | ICD-10-CM | POA: Diagnosis not present

## 2017-05-30 DIAGNOSIS — L03032 Cellulitis of left toe: Secondary | ICD-10-CM | POA: Diagnosis not present

## 2017-05-30 DIAGNOSIS — E1351 Other specified diabetes mellitus with diabetic peripheral angiopathy without gangrene: Secondary | ICD-10-CM | POA: Diagnosis not present

## 2017-05-30 DIAGNOSIS — M21961 Unspecified acquired deformity of right lower leg: Secondary | ICD-10-CM | POA: Diagnosis not present

## 2017-05-30 DIAGNOSIS — M21962 Unspecified acquired deformity of left lower leg: Secondary | ICD-10-CM | POA: Diagnosis not present

## 2017-05-31 DIAGNOSIS — E119 Type 2 diabetes mellitus without complications: Secondary | ICD-10-CM | POA: Diagnosis not present

## 2017-05-31 DIAGNOSIS — Z961 Presence of intraocular lens: Secondary | ICD-10-CM | POA: Diagnosis not present

## 2017-05-31 DIAGNOSIS — H524 Presbyopia: Secondary | ICD-10-CM | POA: Diagnosis not present

## 2017-05-31 LAB — HM DIABETES EYE EXAM

## 2017-06-07 DIAGNOSIS — M170 Bilateral primary osteoarthritis of knee: Secondary | ICD-10-CM | POA: Diagnosis not present

## 2017-06-07 DIAGNOSIS — M7651 Patellar tendinitis, right knee: Secondary | ICD-10-CM | POA: Diagnosis not present

## 2017-06-07 DIAGNOSIS — M25561 Pain in right knee: Secondary | ICD-10-CM | POA: Diagnosis not present

## 2017-06-27 DIAGNOSIS — L820 Inflamed seborrheic keratosis: Secondary | ICD-10-CM | POA: Diagnosis not present

## 2017-06-27 DIAGNOSIS — L309 Dermatitis, unspecified: Secondary | ICD-10-CM | POA: Diagnosis not present

## 2017-07-02 ENCOUNTER — Other Ambulatory Visit: Payer: Self-pay | Admitting: *Deleted

## 2017-07-03 DIAGNOSIS — D2372 Other benign neoplasm of skin of left lower limb, including hip: Secondary | ICD-10-CM | POA: Diagnosis not present

## 2017-07-03 DIAGNOSIS — E1351 Other specified diabetes mellitus with diabetic peripheral angiopathy without gangrene: Secondary | ICD-10-CM | POA: Diagnosis not present

## 2017-07-03 NOTE — Patient Outreach (Signed)
[facility] Screening call #1, unsuccessful but left a message for a return call. If I do not hear back from the member I will try again within the week.  Eulah Pont. Myrtie Neither, MSN, [facility] Gerontological Nurse Practitioner [facility] Care Management [phone]

## 2017-07-12 ENCOUNTER — Other Ambulatory Visit: Payer: Self-pay | Admitting: *Deleted

## 2017-07-12 NOTE — Patient Outreach (Addendum)
Second attempt [facility] Screening call, unsuccessful but left a message for a return call. If I do not hear back from the member I will try again within the week.  Sydney Avila. Sydney Neither, MSN, GNP-BC Gerontological Nurse Practitioner [facility] Care Management [phone]   [name] returned my call today. She sees Dr. Ronnald Ramp at [place]. She has DM, HTN, Hyperlipidemia, Obesity, DDD. She is taking her medications as prescribed and checking her glucose level daily. She denies that she needs any care management services at this time. I will send her a letter for her future reference.  Sydney Avila. Sydney Neither, MSN, [facility] Gerontological Nurse Practitioner [facility] Care Management [phone]

## 2017-07-26 ENCOUNTER — Encounter: Payer: Self-pay | Admitting: Internal Medicine

## 2017-07-26 ENCOUNTER — Other Ambulatory Visit (INDEPENDENT_AMBULATORY_CARE_PROVIDER_SITE_OTHER): Payer: PPO

## 2017-07-26 ENCOUNTER — Ambulatory Visit (INDEPENDENT_AMBULATORY_CARE_PROVIDER_SITE_OTHER): Payer: PPO | Admitting: Internal Medicine

## 2017-07-26 ENCOUNTER — Ambulatory Visit (INDEPENDENT_AMBULATORY_CARE_PROVIDER_SITE_OTHER)
Admission: RE | Admit: 2017-07-26 | Discharge: 2017-07-26 | Disposition: A | Discharge status: Home / Self Care | Payer: PPO | Source: Ambulatory Visit | Attending: Internal Medicine | Admitting: Internal Medicine

## 2017-07-26 VITALS — BP 158/80 | HR 83 | Temp 97.7°F | Resp 16 | Ht 61.75 in | Wt 171.0 lb

## 2017-07-26 DIAGNOSIS — E669 Obesity, unspecified: Secondary | ICD-10-CM

## 2017-07-26 DIAGNOSIS — Z23 Encounter for immunization: Secondary | ICD-10-CM | POA: Diagnosis not present

## 2017-07-26 DIAGNOSIS — E2839 Other primary ovarian failure: Secondary | ICD-10-CM

## 2017-07-26 DIAGNOSIS — I10 Essential (primary) hypertension: Secondary | ICD-10-CM

## 2017-07-26 DIAGNOSIS — E118 Type 2 diabetes mellitus with unspecified complications: Secondary | ICD-10-CM | POA: Diagnosis not present

## 2017-07-26 LAB — BASIC METABOLIC PANEL
BUN: 18 mg/dL (ref 6–23)
CALCIUM: 9.6 mg/dL (ref 8.4–10.5)
CHLORIDE: 101 meq/L (ref 96–112)
CO2: 31 meq/L (ref 19–32)
CREATININE: 0.89 mg/dL (ref 0.40–1.20)
GFR: 65.96 mL/min (ref >60.00)
GLUCOSE: 153 mg/dL — AB (ref 70–99)
POTASSIUM: 4 meq/L (ref 3.5–5.1)
SODIUM: 140 meq/L (ref 135–145)

## 2017-07-26 LAB — MICROALBUMIN / CREATININE URINE RATIO
CREATININE, U: 69.1 mg/dL
Microalb Creat Ratio: 1 mg/g (ref 0.0–30.0)
Microalb, Ur: <0.7 mg/dL (ref 0.0–1.9)

## 2017-07-26 LAB — HEMOGLOBIN A1C: Hgb A1c MFr Bld: 7.2 % — ABNORMAL HIGH (ref 4.6–6.5)

## 2017-07-26 MED ORDER — DULAGLUTIDE 1.5 MG/0.5ML ~~LOC~~ SOAJ: 1.0000 | SUBCUTANEOUS | 1 refills | Status: DC

## 2017-07-26 MED ORDER — LOSARTAN POTASSIUM 100 MG PO TABS: 100.0000 mg | ORAL_TABLET | Freq: Every day | ORAL | 3 refills | Status: DC

## 2017-07-26 MED ORDER — ESTRADIOL 0.06 MG/24HR TD PTWK: 1.0000 | MEDICATED_PATCH | TRANSDERMAL | 1 refills | Status: DC

## 2017-07-26 NOTE — Patient Instructions (Signed)

## 2017-07-26 NOTE — Progress Notes (Signed)
Subjective:  Patient ID: Sydney Avila, female    DOB: 1945-12-07  Age: 71 y.o. MRN: [id number]  CC: Diabetes and Hypertension   HPI Sydney Avila presents for f/up - she complains that she has not lost weight and she complains of hot flashes and wants to restart ERT. She has not been taking any BP meds.  Outpatient Medications Prior to Visit  Medication Sig Dispense Refill  . ALPRAZolam (XANAX) 0.5 MG tablet TAKE 1 TABLET BY MOUTH EVERY NIGHT AT BEDTIME AS NEEDED FOR ANXIETY 30 tablet 4  . cholestyramine (QUESTRAN) 4 GM/DOSE powder TAKE 1 SCOOP (4 G TOTAL) BY MOUTH DAILY. 756 g 11  . fluocinonide ointment (LIDEX) 0.05 %     . glucose blood (ONE TOUCH ULTRA TEST) test strip Use to check blood sugar twice per day. DX E11.9 200 each 3  . metFORMIN (GLUCOPHAGE-XR) 500 MG 24 hr tablet Take 2 tablets (1,000 mg total) by mouth 2 (two) times daily. 360 tablet 1  . simvastatin (ZOCOR) 20 MG tablet TAKE 1 TABLET (20 MG TOTAL) BY MOUTH EVERY EVENING. 90 tablet 3  . tiZANidine (ZANAFLEX) 2 MG tablet     . TRULICITY 6.96 VE/9.3YB SOPN INECT 0.5ML INTO SKIN ONCE WEEKLY 12 pen 1  . amLODipine (NORVASC) 5 MG tablet Take 1 tablet (5 mg total) by mouth daily. (Patient not taking: Reported on 07/26/2017) 90 tablet 3  . estradiol (CLIMARA) 0.06 MG/24HR Place 1 patch onto the skin once a week. (Patient not taking: Reported on 07/26/2017) 12 patch 1  . losartan (COZAAR) 100 MG tablet Take 1 tablet (100 mg total) by mouth daily. (Patient not taking: Reported on 07/26/2017) 90 tablet 3   No facility-administered medications prior to visit.     ROS Review of Systems  Constitutional: Negative for diaphoresis, fatigue and unexpected weight change.  HENT: Negative.   Eyes: Negative.   Respiratory: Negative.  Negative for cough, chest tightness, shortness of breath and wheezing.   Cardiovascular: Negative for chest pain, palpitations and leg swelling.  Gastrointestinal: Negative.  Negative for abdominal pain,  constipation, diarrhea and nausea.  Endocrine: Negative.  Negative for polydipsia, polyphagia and polyuria.  Genitourinary: Negative.  Negative for decreased urine volume, difficulty urinating and urgency.  Musculoskeletal: Positive for arthralgias. Negative for myalgias.  Skin: Negative.   Neurological: Negative.  Negative for dizziness and weakness.  Hematological: Negative for adenopathy. Does not bruise/bleed easily.  Psychiatric/Behavioral: Negative.     Objective:  BP (!) 158/80 (BP Location: Left Arm, Patient Position: Sitting, Cuff Size: Normal)   Pulse 83   Temp 97.7 F (36.5 C) (Oral)   Ht 5' 1.75" (1.568 m)   Wt 171 lb (77.6 kg)   SpO2 97%   BMI 31.53 kg/m   BP Readings from Last 3 Encounters:  07/26/17 (!) 158/80  01/15/17 130/70  11/09/16 (!) 180/90    Wt Readings from Last 3 Encounters:  07/26/17 171 lb (77.6 kg)  01/15/17 178 lb 8 oz (81 kg)  11/09/16 175 lb (79.4 kg)    Physical Exam  Constitutional: She is oriented to person, place, and time. No distress.  HENT:  Mouth/Throat: Oropharynx is clear and moist. No oropharyngeal exudate.  Eyes: Conjunctivae are normal. Right eye exhibits no discharge. Left eye exhibits no discharge. No scleral icterus.  Neck: Normal range of motion. Neck supple. No JVD present. No thyromegaly present.  Cardiovascular: Normal rate, regular rhythm and intact distal pulses.  Exam reveals no gallop and no friction  rub.   No murmur heard. Pulmonary/Chest: Effort normal and breath sounds normal. No respiratory distress. She has no wheezes. She has no rales. She exhibits no tenderness.  Abdominal: Soft. Bowel sounds are normal. She exhibits no distension and no mass. There is no tenderness. There is no rebound and no guarding.  Musculoskeletal: Normal range of motion. She exhibits no edema, tenderness or deformity.  Lymphadenopathy:    She has no cervical adenopathy.  Neurological: She is alert and oriented to person, place, and  time.  Skin: Skin is warm and dry. No rash noted. She is not diaphoretic. No erythema. No pallor.  Vitals reviewed.   Lab Results  Component Value Date   WBC 7.1 11/09/2016   HGB 13.6 11/09/2016   HCT 40.2 11/09/2016   PLT 213.0 11/09/2016   GLUCOSE 283 (H) 01/15/2017   CHOL 113 10/11/2016   TRIG 225.0 (H) 10/11/2016   HDL 40.80 10/11/2016   LDLDIRECT 47.0 10/11/2016   LDLCALC 30 10/27/2013   ALT 13 11/09/2016   AST <3 11/09/2016   NA 137 01/15/2017   K 4.0 01/15/2017   CL 103 01/15/2017   CREATININE 0.84 01/15/2017   BUN 16 01/15/2017   CO2 26 01/15/2017   TSH 3.04 10/27/2015   HGBA1C 8.8 (H) 01/15/2017   MICROALBUR 0.9 04/05/2016    No results Avila.  Assessment & Plan:   Sydney Avila was seen today for diabetes and hypertension.  Diagnoses and all orders for this visit:  Essential hypertension, benign- her BP is not well controlled, will restart the ARB -     Basic metabolic panel; Future -     losartan (COZAAR) 100 MG tablet; Take 1 tablet (100 mg total) by mouth daily.  Type 2 diabetes mellitus with complication, without long-term current use of insulin ([name])- her blood sugars are well controlled, will increase the dose of the GLP-1 agonist to help her lose weight -     Basic metabolic panel; Future -     Microalbumin / creatinine urine ratio; Future -     Hemoglobin A1c; Future -     losartan (COZAAR) 100 MG tablet; Take 1 tablet (100 mg total) by mouth daily. -     Dulaglutide (TRULICITY) 1.5 VQ/2.5ZD SOPN; Inject 1 Act into the skin once a week.  Obesity (BMI 30.0-34.9)- as above, she will also work on her lifestyle modifications  Estrogen deficiency -     estradiol (CLIMARA) 0.06 MG/24HR; Place 1 patch onto the skin once a week.   I have discontinued [name]'s amLODipine and TRULICITY. I am also having her start on Dulaglutide. Additionally, I am having her maintain her tiZANidine, glucose blood, simvastatin, fluocinonide ointment, cholestyramine,  ALPRAZolam, metFORMIN, losartan, and estradiol.  Meds ordered this encounter  Medications  . losartan (COZAAR) 100 MG tablet    Sig: Take 1 tablet (100 mg total) by mouth daily.    Dispense:  90 tablet    Refill:  3  . Dulaglutide (TRULICITY) 1.5 GL/8.7FI SOPN    Sig: Inject 1 Act into the skin once a week.    Dispense:  12 pen    Refill:  1  . estradiol (CLIMARA) 0.06 MG/24HR    Sig: Place 1 patch onto the skin once a week.    Dispense:  12 patch    Refill:  1     Follow-up: No Follow-up on file.  [name], MD

## 2017-07-27 ENCOUNTER — Other Ambulatory Visit: Payer: Self-pay | Admitting: Internal Medicine

## 2017-07-27 ENCOUNTER — Encounter: Payer: Self-pay | Admitting: Internal Medicine

## 2017-07-30 ENCOUNTER — Encounter: Payer: Self-pay | Admitting: Internal Medicine

## 2017-07-30 LAB — HM DEXA SCAN

## 2017-08-20 ENCOUNTER — Telehealth: Payer: Self-pay

## 2017-08-20 NOTE — Telephone Encounter (Signed)
PA started via paper request.

## 2017-08-21 ENCOUNTER — Other Ambulatory Visit: Payer: Self-pay | Admitting: Internal Medicine

## 2017-08-21 DIAGNOSIS — Z1231 Encounter for screening mammogram for malignant neoplasm of breast: Secondary | ICD-10-CM

## 2017-08-24 ENCOUNTER — Other Ambulatory Visit: Payer: Self-pay | Admitting: Internal Medicine

## 2017-08-27 ENCOUNTER — Ambulatory Visit (HOSPITAL_BASED_OUTPATIENT_CLINIC_OR_DEPARTMENT_OTHER): Payer: PPO

## 2017-08-27 DIAGNOSIS — L308 Other specified dermatitis: Secondary | ICD-10-CM | POA: Diagnosis not present

## 2017-08-27 DIAGNOSIS — L820 Inflamed seborrheic keratosis: Secondary | ICD-10-CM | POA: Diagnosis not present

## 2017-09-04 ENCOUNTER — Encounter (HOSPITAL_BASED_OUTPATIENT_CLINIC_OR_DEPARTMENT_OTHER): Payer: Self-pay

## 2017-09-04 ENCOUNTER — Ambulatory Visit (HOSPITAL_BASED_OUTPATIENT_CLINIC_OR_DEPARTMENT_OTHER)
Admission: RE | Admit: 2017-09-04 | Discharge: 2017-09-04 | Disposition: A | Discharge status: Home / Self Care | Payer: PPO | Source: Ambulatory Visit | Attending: Internal Medicine | Admitting: Internal Medicine

## 2017-09-04 DIAGNOSIS — Z1231 Encounter for screening mammogram for malignant neoplasm of breast: Secondary | ICD-10-CM | POA: Insufficient documentation

## 2017-09-05 LAB — HM MAMMOGRAPHY

## 2017-09-09 ENCOUNTER — Encounter: Payer: Self-pay | Admitting: Internal Medicine

## 2017-09-10 ENCOUNTER — Other Ambulatory Visit: Payer: Self-pay | Admitting: Internal Medicine

## 2017-09-10 DIAGNOSIS — E118 Type 2 diabetes mellitus with unspecified complications: Secondary | ICD-10-CM

## 2017-09-10 MED ORDER — DULAGLUTIDE 0.75 MG/0.5ML ~~LOC~~ SOAJ: 1.0000 | SUBCUTANEOUS | 1 refills | Status: DC

## 2017-09-14 ENCOUNTER — Other Ambulatory Visit: Payer: Self-pay | Admitting: Internal Medicine

## 2017-10-19 ENCOUNTER — Other Ambulatory Visit: Payer: Self-pay | Admitting: Internal Medicine

## 2017-10-21 ENCOUNTER — Other Ambulatory Visit: Payer: Self-pay | Admitting: Internal Medicine

## 2017-10-22 ENCOUNTER — Other Ambulatory Visit: Payer: Self-pay | Admitting: Internal Medicine

## 2017-10-22 ENCOUNTER — Encounter: Payer: Self-pay | Admitting: Internal Medicine

## 2017-10-22 DIAGNOSIS — I10 Essential (primary) hypertension: Secondary | ICD-10-CM

## 2017-10-22 DIAGNOSIS — E118 Type 2 diabetes mellitus with unspecified complications: Secondary | ICD-10-CM

## 2017-10-22 MED ORDER — LISINOPRIL 10 MG PO TABS: 10.0000 mg | ORAL_TABLET | Freq: Every day | ORAL | 1 refills | Status: DC

## 2017-11-07 ENCOUNTER — Other Ambulatory Visit: Payer: Self-pay | Admitting: Internal Medicine

## 2017-11-07 DIAGNOSIS — E118 Type 2 diabetes mellitus with unspecified complications: Secondary | ICD-10-CM

## 2017-11-09 IMAGING — CT CT ABD-PELV W/ CM
2 of 5 series · 16 of 46 positions shown, 18 images · IV contrast (ISOVUE 300)
Comparison: 11/09/2016 plain film exam.

CLINICAL DATA: 70-year-old diabetic hypertensive female with left
upper quadrant pain and tenderness for 4 days. Slightly elevated
lipase. Subsequent encounter.

EXAM:
CT ABDOMEN AND PELVIS WITH CONTRAST
TECHNIQUE: Multidetector CT imaging of the abdomen and pelvis was performed
using the standard protocol following bolus administration of
intravenous contrast.
CONTRAST:  100mL [id number] IOPAMIDOL ([id number]) INJECTION 61%

[Series 2: abd/pel w · axial · 0.84mm/px · z∈[-752,-367]mm · 13 of 87 slices shown, 15 images]
[im 5/87  soft-tissue]
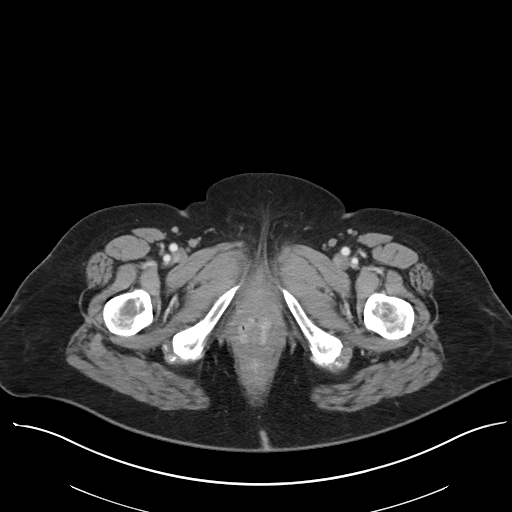
[im 5/87  bone]
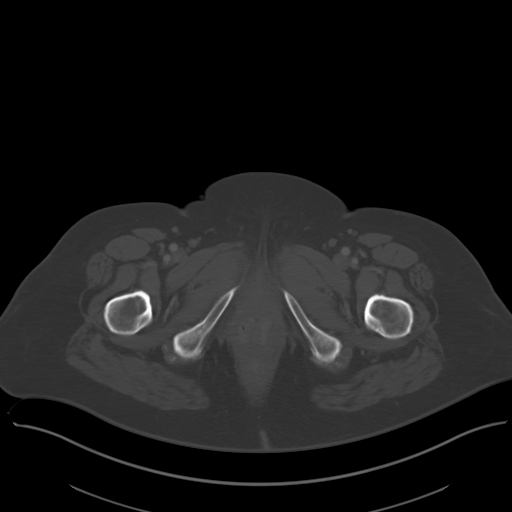
[im 14/87  soft-tissue]
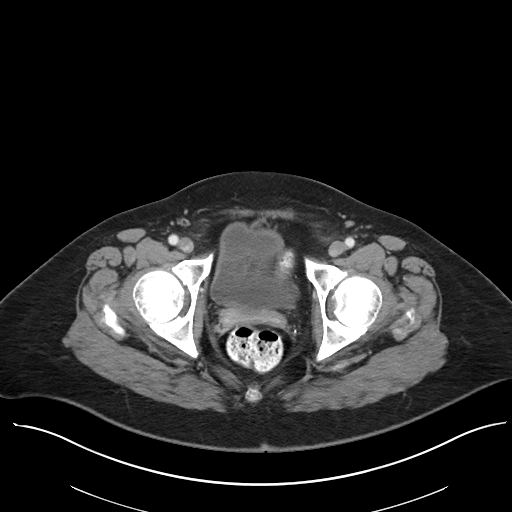
[im 19/87  soft-tissue]
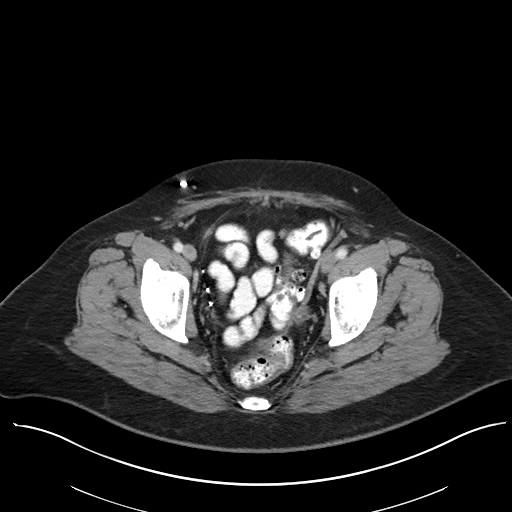
[im 23/87  soft-tissue]
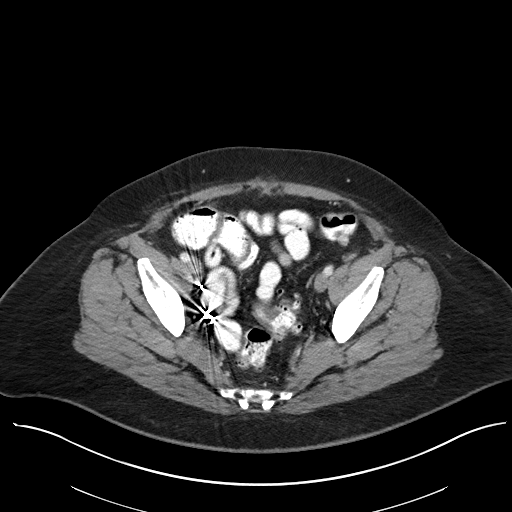
[im 32/87  soft-tissue]
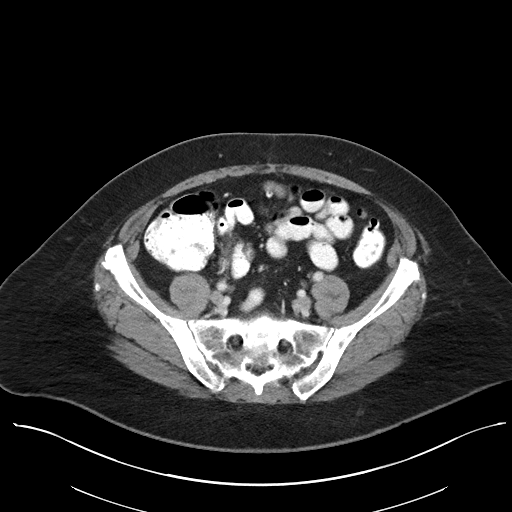
[im 37/87  soft-tissue]
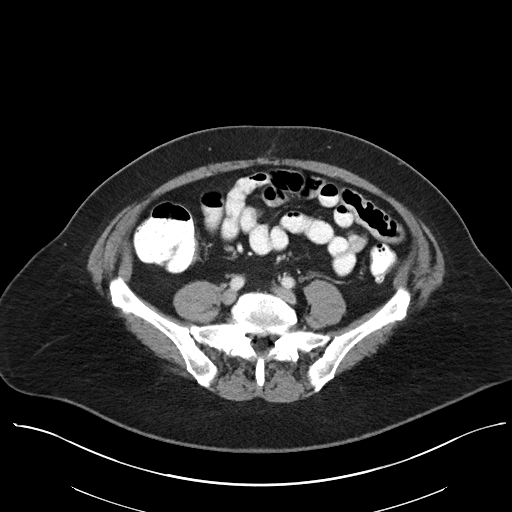
[im 46/87  soft-tissue]
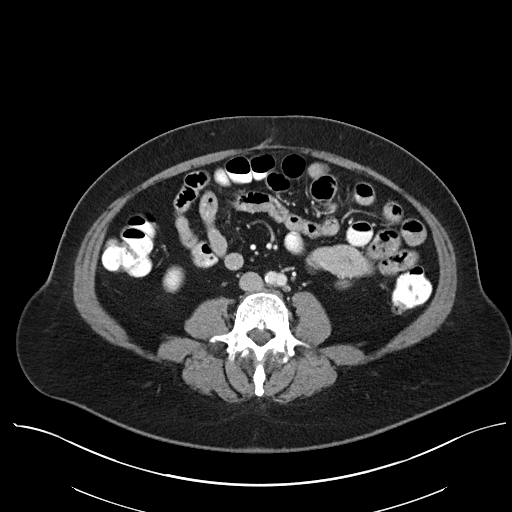
[im 50/87  soft-tissue]
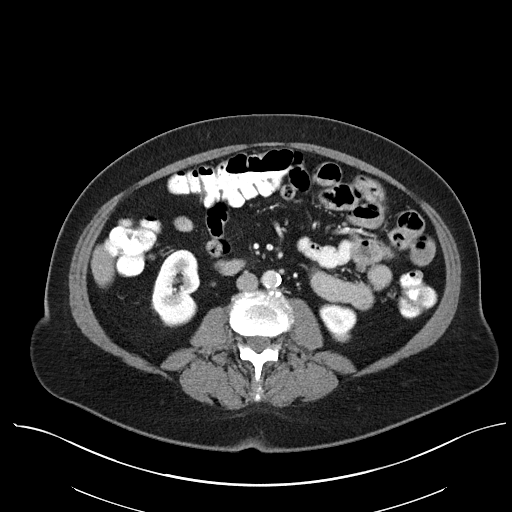
[im 55/87  soft-tissue]
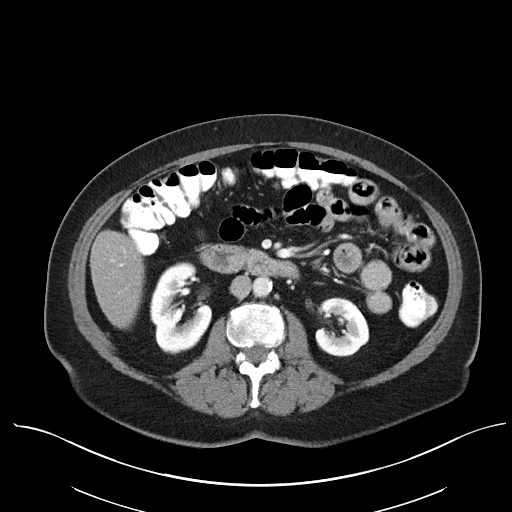
[im 55/87  bone]
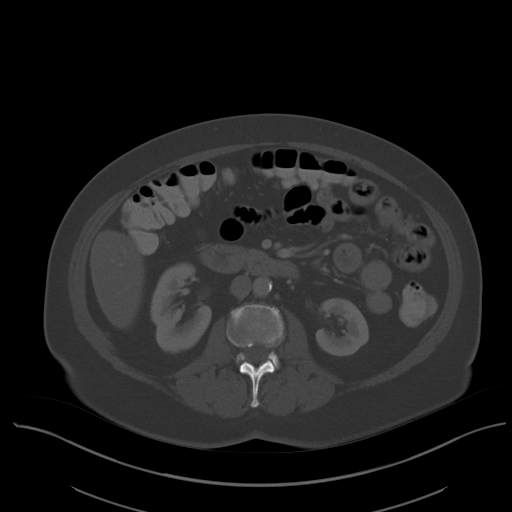
[im 64/87  soft-tissue]
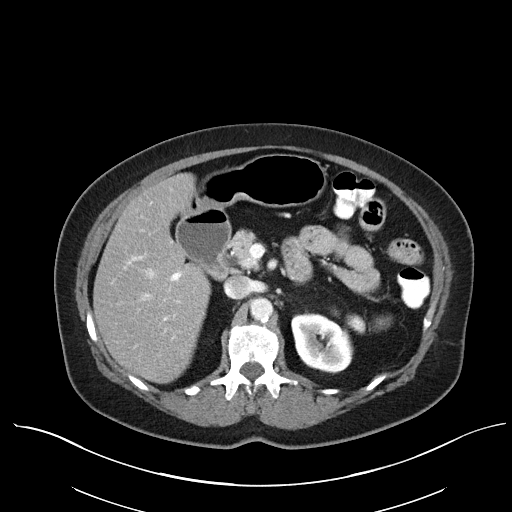
[im 68/87  soft-tissue]
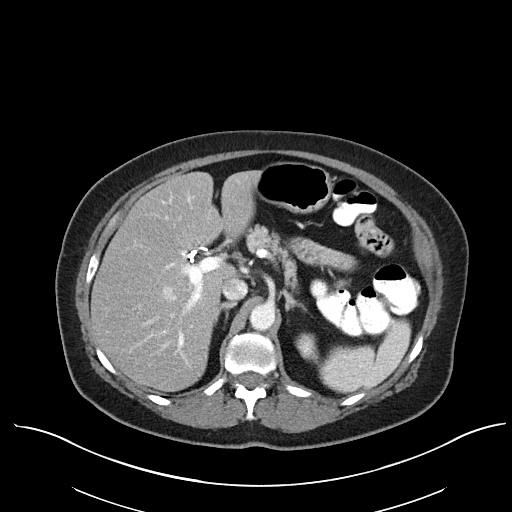
[im 73/87  soft-tissue]
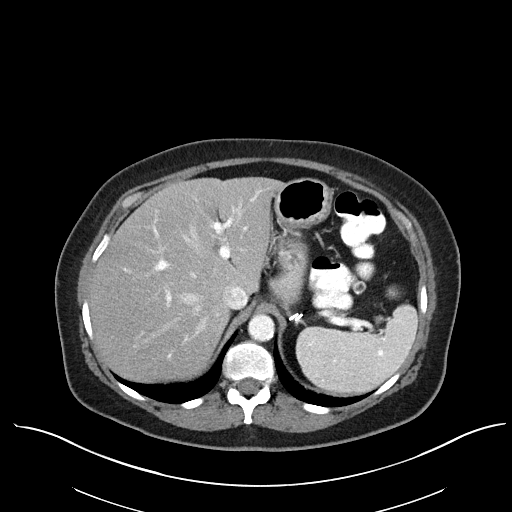
[im 82/87  soft-tissue]
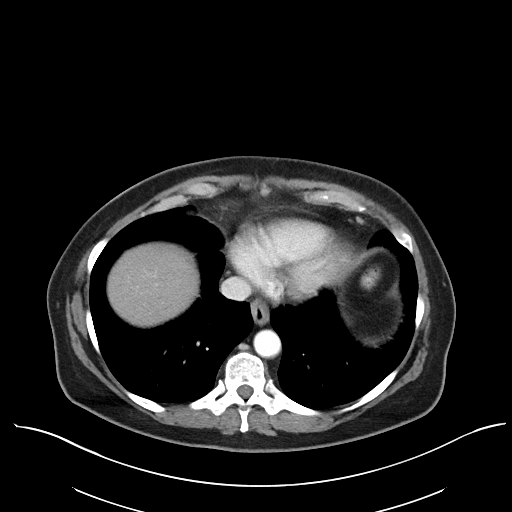

[Series 5: abd/pel w st · coronal · 0.76mm/px · 3 of 102 slices shown]
[im 34/102  soft-tissue]
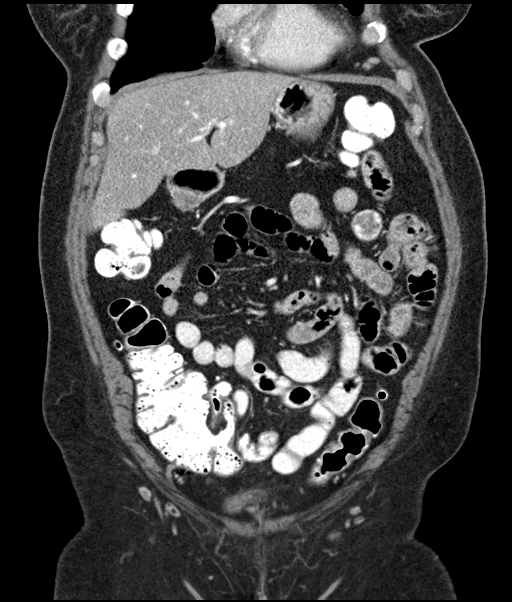
[im 45/102  soft-tissue]
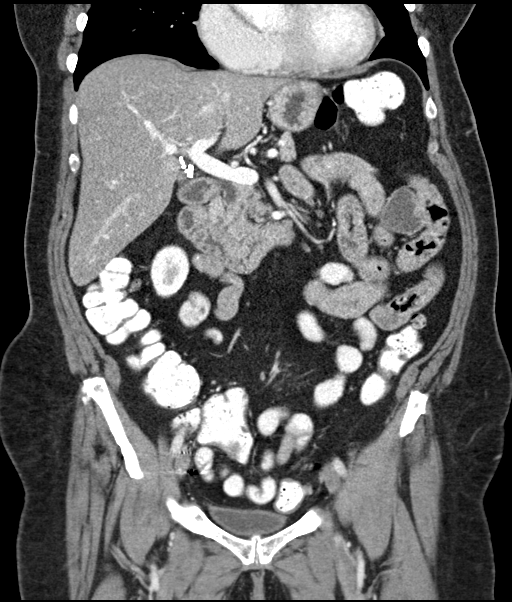
[im 57/102  soft-tissue]
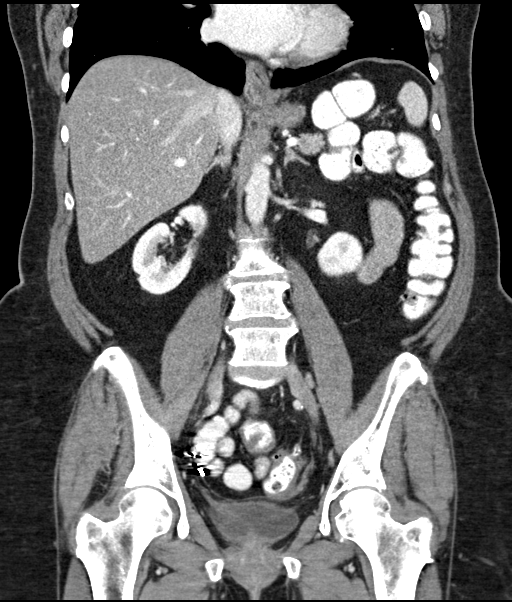

[16 of 46 positions shown; findings below may reference images not displayed]

FINDINGS: Lower chest: Calcified granuloma left lung base. Heart size
top-normal. Coronary artery calcifications.

Hepatobiliary: Fatty infiltration of top-normal size liver without
focal hepatic lesion. Post cholecystectomy. No calcified common bile
duct stone.

Pancreas: No mass or inflammation.

Spleen: Accessory splenic tissue without mass or enlargement.

Adrenals/Urinary Tract: No hydronephrosis. No renal mass or adrenal
lesion.

Stomach/Bowel: Post hiatal hernia repair with surgical clips and
mild thickening in this region.

3.6 cm air-fluid collection appears be a large diverticula of
jejunum. Perforation with walled-off abscess felt to be less likely
consideration.

Scattered diverticula most notable sigmoid colon. Question mild
inflammation of single diverticula of the sigmoid colon?

Vascular/Lymphatic: Atherosclerotic changes of the aorta without
aneurysm or large vessel occlusion.

No adenopathy.

Reproductive: Post hysterectomy and oophorectomy with surgical clips
in place.

Other: Negative

Musculoskeletal: Degenerative changes most notable lower lumbar
spine. No osseous destructive lesion.
IMPRESSION: No CT evidence of pancreatitis.

3.6 cm air-fluid collection appears to be a large diverticula of
jejunum. Perforation with walled-off abscess felt to be less likely
consideration.

Question mild inflammation of a single diverticula of the sigmoid
colon?

Post cholecystectomy.  No calcified common bile duct stone.

Fatty liver.

Coronary artery calcifications.

Aortic atherosclerosis.

Post hiatal hernia attempted repair.

Post hysterectomy and oophorectomy.

These results will be called to the ordering clinician or
representative by the Radiologist Assistant, and communication
documented in the PACS or zVision Dashboard.

## 2017-11-27 ENCOUNTER — Ambulatory Visit: Payer: PPO | Admitting: Internal Medicine

## 2017-12-20 ENCOUNTER — Other Ambulatory Visit: Payer: Self-pay | Admitting: Internal Medicine

## 2018-01-15 ENCOUNTER — Other Ambulatory Visit: Payer: Self-pay | Admitting: Internal Medicine

## 2018-01-15 DIAGNOSIS — E118 Type 2 diabetes mellitus with unspecified complications: Secondary | ICD-10-CM

## 2018-01-16 ENCOUNTER — Other Ambulatory Visit: Payer: Self-pay | Admitting: *Deleted

## 2018-01-16 ENCOUNTER — Telehealth: Payer: Self-pay | Admitting: Internal Medicine

## 2018-01-16 DIAGNOSIS — E118 Type 2 diabetes mellitus with unspecified complications: Secondary | ICD-10-CM

## 2018-01-16 MED ORDER — METFORMIN HCL ER 500 MG PO TB24: 1000.0000 mg | ORAL_TABLET | Freq: Two times a day (BID) | ORAL | 0 refills | Status: DC

## 2018-01-16 NOTE — Telephone Encounter (Signed)
Copied from [place] [phone]. Topic: General - Other >> Jan 16, 2018  8:15 AM Darl Householder, RMA wrote: Reason for CRM: Medication refill request for metFORMIN (GLUCOPHAGE-XR) 500 MG 24 hr tablet to be sent to [facility]

## 2018-01-16 NOTE — Telephone Encounter (Signed)
Rx refilled per protocol- patient has appointment scheduled 01/30/18

## 2018-01-29 ENCOUNTER — Other Ambulatory Visit: Payer: PPO

## 2018-01-30 ENCOUNTER — Encounter: Payer: Self-pay | Admitting: Internal Medicine

## 2018-01-30 ENCOUNTER — Other Ambulatory Visit (INDEPENDENT_AMBULATORY_CARE_PROVIDER_SITE_OTHER): Payer: PPO

## 2018-01-30 ENCOUNTER — Ambulatory Visit (INDEPENDENT_AMBULATORY_CARE_PROVIDER_SITE_OTHER): Payer: PPO | Admitting: Internal Medicine

## 2018-01-30 VITALS — BP 130/72 | HR 96 | Temp 97.8°F | Resp 16 | Wt 163.0 lb

## 2018-01-30 DIAGNOSIS — I10 Essential (primary) hypertension: Secondary | ICD-10-CM

## 2018-01-30 DIAGNOSIS — E118 Type 2 diabetes mellitus with unspecified complications: Secondary | ICD-10-CM

## 2018-01-30 DIAGNOSIS — E785 Hyperlipidemia, unspecified: Secondary | ICD-10-CM | POA: Diagnosis not present

## 2018-01-30 DIAGNOSIS — F411 Generalized anxiety disorder: Secondary | ICD-10-CM

## 2018-01-30 LAB — HEMOGLOBIN A1C: Hgb A1c MFr Bld: 6.7 % — ABNORMAL HIGH (ref 4.6–6.5)

## 2018-01-30 LAB — THYROID PANEL WITH TSH
Free Thyroxine Index: 2.4 (ref 1.4–3.8)
T3 UPTAKE: 27 % (ref 22–35)
T4, Total: 8.9 ug/dL (ref 5.1–11.9)
TSH: 1.84 mIU/L (ref 0.40–4.50)

## 2018-01-30 LAB — BASIC METABOLIC PANEL
BUN: 14 mg/dL (ref 6–23)
CHLORIDE: 104 meq/L (ref 96–112)
CO2: 28 meq/L (ref 19–32)
CREATININE: 0.88 mg/dL (ref 0.40–1.20)
Calcium: 9.4 mg/dL (ref 8.4–10.5)
GFR: 66.73 mL/min (ref >60.00)
GLUCOSE: 118 mg/dL — AB (ref 70–99)
Potassium: 4.3 mEq/L (ref 3.5–5.1)
Sodium: 139 mEq/L (ref 135–145)

## 2018-01-30 LAB — LIPID PANEL
CHOL/HDL RATIO: 2
Cholesterol: 115 mg/dL (ref 0–200)
HDL: 46.9 mg/dL (ref >39.00)
LDL CALC: 32 mg/dL (ref 0–99)
NONHDL: 68.29
TRIGLYCERIDES: 181 mg/dL — AB (ref 0.0–149.0)
VLDL: 36.2 mg/dL (ref 0.0–40.0)

## 2018-01-30 MED ORDER — ALPRAZOLAM 0.5 MG PO TABS: 0.5000 mg | ORAL_TABLET | Freq: Two times a day (BID) | ORAL | 5 refills | Status: DC | PRN

## 2018-01-30 NOTE — Progress Notes (Signed)
Subjective:  Patient ID: Sydney Avila, female    DOB: 1946/08/31  Age: 72 y.o. MRN: [id number]  CC: Hypertension; Hyperlipidemia; and Diabetes   HPI Sydney Avila presents for f/up - she has had several stressors over the last few months and wants to increase her dose of Xanax for the occasional episode of anxiety and panicky feeling.  Otherwise she feels well and offers no complaints.  She has been very active and has adjusted her lifestyle modifications and has been able to lose 8 pounds since I last saw her.  She tells me her blood sugars have been well controlled.  Outpatient Medications Prior to Visit  Medication Sig Dispense Refill  . cholestyramine (QUESTRAN) 4 GM/DOSE powder TAKE 1 SCOOP (4 G TOTAL) BY MOUTH DAILY. 756 g 11  . Dulaglutide (TRULICITY) 2.70 JJ/0.0XF SOPN Inject 1 Act into the skin once a week. 12 pen 1  . fluocinonide ointment (LIDEX) 0.05 %     . glucose blood (ONE TOUCH ULTRA TEST) test strip USE TO CHECK BLOOD SUGAR TWICE PER DAY 100 each 2  . lisinopril (PRINIVIL,ZESTRIL) 10 MG tablet Take 1 tablet (10 mg total) by mouth daily. 90 tablet 1  . metFORMIN (GLUCOPHAGE-XR) 500 MG 24 hr tablet Take 2 tablets (1,000 mg total) by mouth 2 (two) times daily. 360 tablet 0  . simvastatin (ZOCOR) 20 MG tablet TAKE 1 TABLET (20 MG TOTAL) BY MOUTH EVERY EVENING. 90 tablet 2  . ALPRAZolam (XANAX) 0.5 MG tablet TAKE ONE TABLET BY MOUTH EVERY NIGHT AT BEDTIME AS NEEDED FOR ANXIETY 30 tablet 2  . tiZANidine (ZANAFLEX) 2 MG tablet     . estradiol (CLIMARA) 0.06 MG/24HR Place 1 patch onto the skin once a week. 12 patch 1   No facility-administered medications prior to visit.     ROS Review of Systems  Constitutional: Negative.  Negative for diaphoresis, fatigue and unexpected weight change.  HENT: Negative.   Eyes: Negative for visual disturbance.  Respiratory: Negative for cough, chest tightness and shortness of breath.   Cardiovascular: Negative.  Negative for chest pain,  palpitations and leg swelling.  Gastrointestinal: Negative for abdominal pain, diarrhea, nausea and vomiting.  Endocrine: Negative for polydipsia, polyphagia and polyuria.  Genitourinary: Negative.  Negative for difficulty urinating.  Musculoskeletal: Negative.  Negative for arthralgias and myalgias.  Skin: Negative.  Negative for pallor and rash.  Neurological: Negative.  Negative for dizziness, weakness and light-headedness.  Hematological: Negative for adenopathy. Does not bruise/bleed easily.  Psychiatric/Behavioral: Negative for behavioral problems, decreased concentration, dysphoric mood, self-injury, sleep disturbance and suicidal ideas. The patient is nervous/anxious.     Objective:  BP 130/72   Pulse 96   Temp 97.8 F (36.6 C) (Oral)   Resp 16   Wt 163 lb (73.9 kg)   SpO2 97%   BMI 30.05 kg/m   BP Readings from Last 3 Encounters:  01/30/18 130/72  07/26/17 (!) 158/80  01/15/17 130/70    Wt Readings from Last 3 Encounters:  01/30/18 163 lb (73.9 kg)  07/26/17 171 lb (77.6 kg)  01/15/17 178 lb 8 oz (81 kg)    Physical Exam  Constitutional: She is oriented to person, place, and time. No distress.  HENT:  Mouth/Throat: Oropharynx is clear and moist. No oropharyngeal exudate.  Eyes: Conjunctivae are normal. No scleral icterus.  Neck: Normal range of motion. Neck supple. No JVD present.  Cardiovascular: Normal rate, regular rhythm and normal heart sounds. Exam reveals no gallop and no friction  rub.  No murmur heard. Pulmonary/Chest: Effort normal and breath sounds normal. No stridor. No respiratory distress. She has no wheezes. She has no rales.  Abdominal: Soft. Bowel sounds are normal. She exhibits no distension and no mass. There is no tenderness.  Musculoskeletal: Normal range of motion. She exhibits no edema, tenderness or deformity.  Lymphadenopathy:    She has no cervical adenopathy.  Neurological: She is alert and oriented to person, place, and time.  Skin:  Skin is warm and dry. She is not diaphoretic.  Psychiatric: She has a normal mood and affect. Her behavior is normal. Judgment and thought content normal.  Vitals reviewed.   Lab Results  Component Value Date   WBC 7.1 11/09/2016   HGB 13.6 11/09/2016   HCT 40.2 11/09/2016   PLT 213.0 11/09/2016   GLUCOSE 118 (H) 01/30/2018   CHOL 115 01/30/2018   TRIG 181.0 (H) 01/30/2018   HDL 46.90 01/30/2018   LDLDIRECT 47.0 10/11/2016   LDLCALC 32 01/30/2018   ALT 13 11/09/2016   AST <3 11/09/2016   NA 139 01/30/2018   K 4.3 01/30/2018   CL 104 01/30/2018   CREATININE 0.88 01/30/2018   BUN 14 01/30/2018   CO2 28 01/30/2018   TSH 1.84 01/30/2018   HGBA1C 6.7 (H) 01/30/2018   MICROALBUR <0.7 07/26/2017    Mm Digital Screening Bilateral  Result Date: 09/05/2017 CLINICAL DATA:  Screening. EXAM: DIGITAL SCREENING BILATERAL MAMMOGRAM WITH CAD COMPARISON:  Previous exam(s). ACR Breast Density Category c: The breast tissue is heterogeneously dense, which may obscure small masses. FINDINGS: There are no findings suspicious for malignancy. Images were processed with CAD. IMPRESSION: No mammographic evidence of malignancy. A result letter of this screening mammogram will be mailed directly to the patient. RECOMMENDATION: Screening mammogram in one year. (Code:[id number]) BI-RADS CATEGORY  1: Negative. Electronically Signed   By: Franki Cabot M.D.   On: 09/05/2017 07:58    Assessment & Plan:   Sydney Avila was seen today for hypertension, hyperlipidemia and diabetes.  Diagnoses and all orders for this visit:  Essential hypertension, benign- Her blood pressure is well controlled.  Electrolytes and renal function are normal. -     Basic metabolic panel; Future -     Thyroid Panel With TSH; Future  Type 2 diabetes mellitus with complication, without long-term current use of insulin ([place])- Her A1c is down to 6.7%.  She was praised for her lifestyle modifications.  Will continue the current hypoglycemic  regimen. -     Basic metabolic panel; Future -     Hemoglobin A1c; Future  Hyperlipidemia with target LDL less than 100- She has achieved her LDL goal and is doing well on the statin. -     Lipid panel; Future -     Thyroid Panel With TSH; Future  GAD (generalized anxiety disorder)- She is not willing to start an SSRI.  Will increase the dose of alprazolam. -     ALPRAZolam (XANAX) 0.5 MG tablet; Take 1 tablet (0.5 mg total) by mouth 2 (two) times daily as needed for anxiety.   I have discontinued Sydney Avila. Sydney "Sue"'s tiZANidine and estradiol. I have also changed her ALPRAZolam. Additionally, I am having her maintain her fluocinonide ointment, cholestyramine, simvastatin, Dulaglutide, glucose blood, lisinopril, and metFORMIN.  Meds ordered this encounter  Medications  . ALPRAZolam (XANAX) 0.5 MG tablet    Sig: Take 1 tablet (0.5 mg total) by mouth 2 (two) times daily as needed for anxiety.    Dispense:  45 tablet    Refill:  5     Follow-up: Return in about 6 months (around 08/01/2018).  [name], MD

## 2018-01-30 NOTE — Patient Instructions (Signed)

## 2018-01-31 ENCOUNTER — Encounter: Payer: Self-pay | Admitting: Internal Medicine

## 2018-03-28 ENCOUNTER — Telehealth: Payer: Self-pay | Admitting: Pharmacist

## 2018-03-28 ENCOUNTER — Other Ambulatory Visit: Payer: Self-pay

## 2018-03-28 NOTE — Patient Outreach (Addendum)
[facility]) Care Management  03/28/2018  TAHIRI SHAREEF 01-Sep-1946 [id number]   Telephone Screen  Referral Date: 03/27/18 Referral Source: [facility] UM Dept.  Referral Reason: " [facility] UM received request from [facility] team for prescription assistance for this member. This member is in the coverage gap. She takes Trulicity each Sunday. This medication is extremely expensive for her to purchase but is neccessary for her to have. Member is currently out and needs to get meds as soon as she can. Member would like assistance in paying for medication. The cost is $ 188.12." Insurance: [facility]   Outreach attempt # 1  to patient. Spoke with patient and discussed referral. Patient wanting an "immeditate fix" to her problem stating she as told "we([facility]) would handle it." Discussed referral process and referring patient to pharmacy dept for possible med assistance. RN CM explained to patient the importance of notifying her PCP that she is out of meds to provide update on this and to see if office has samples. Patient became upset and agitated at this and stated that this was something "we needed to do." RN CM attempted to complete screening assessment but patient not engaging and not willing to answer a lot of questions. When patient asked about total number of meds she abruptly responded with " you have all that info." RN CM explained to patient that if she wanted assistance from [facility] then she would need to cooperate and answer questions. She then voiced that she was taking 3 prescription meds and 1 OTC med. RN CM tried to assess how patient was managing Diabetes and she again became guarded with her responses. She abruptly stating that her blood sugars are good and her A1C was 6.7 due to recent changes in he diet and exercise. Patient then rushing to get off phone stating she was headed out to the grocery store.      Plan: RN CM will send [facility] pharmacy referral for possible med assistance.     Enzo Montgomery, RN,BSN,CCM [place] [facility] Telephonic Care Management Coordinator Direct Phone: [phone] Toll Free: [phone] Fax: [phone]

## 2018-03-28 NOTE — Patient Outreach (Signed)
[facility]) Care Management  [place]   03/28/2018  Sydney Avila 02-10-1946 [id number]  Subjective: Patient was called regarding medication assistance with Trulicity HIPAA identifiers were obtained.  Patient is a 72 year old female with hyperlipidemia, cervical DDD, anxiety, type 2 diabetes, obesity, and chronic diarrhea secondary to gallbladder removal surgery.  Patient manages her medications on her own. She reported she was in the donut hole and that a 3 month supply of Trulicity would cost her $600 and a 1 month supply would cost her $200.  Objective:  HgA1c 6.7% 01/30/18  Encounter Medications: Outpatient Encounter Medications as of 03/28/2018  Medication Sig  . ALPRAZolam (XANAX) 0.5 MG tablet Take 1 tablet (0.5 mg total) by mouth 2 (two) times daily as needed for anxiety.  . cholestyramine (QUESTRAN) 4 GM/DOSE powder TAKE 1 SCOOP (4 G TOTAL) BY MOUTH DAILY.  . Dulaglutide (TRULICITY) 8.14 GY/1.8HU SOPN Inject 1 Act into the skin once a week.  . fluocinonide ointment (LIDEX) 3.14 % Apply 1 application topically as needed.   Marland Kitchen glucose blood (ONE TOUCH ULTRA TEST) test strip USE TO CHECK BLOOD SUGAR TWICE PER DAY  . lisinopril (PRINIVIL,ZESTRIL) 10 MG tablet Take 1 tablet (10 mg total) by mouth daily.  . metFORMIN (GLUCOPHAGE-XR) 500 MG 24 hr tablet Take 2 tablets (1,000 mg total) by mouth 2 (two) times daily.  . simvastatin (ZOCOR) 20 MG tablet TAKE 1 TABLET (20 MG TOTAL) BY MOUTH EVERY EVENING.   No facility-administered encounter medications on file as of 03/28/2018.     Functional Status: No flowsheet data found.  Fall/Depression Screening: Fall Risk  07/26/2017 10/27/2015 10/22/2014  Falls in the past year? No No No   PHQ 2/9 Scores 07/26/2017 10/27/2015 10/22/2014 05/06/2014 10/31/2013  PHQ - 2 Score 0 0 0 0 0      Assessment: Patient's medications were reviewed via telephone.    Drugs sorted by  system:  Neurologic/Psychologic: Alprazolam  Cardiovascular: Lisinopril Simvastatin   Gastrointestinal: Cholestyramine   Endocrine: Trulicity Metformin  Topical: Fluocinonide  Medication Assistance Findings: -Patient has Health [employer] Advantage -She is over income for the Extra Help/LIS program -She may qualify for [employer] program for Trulicity however, she has only spent $660 year-to-date in medication expenses  [employer] program requires patients to spend at lest $1100 out of pocket.   -Patient will submit a hardship letter with her application. -Patient decided to come to the [facility] office to sign her applications versus having them mailed.    Plan: Route note to patient's PCP to request signature of the Lilly application Prepare letters and application for patient to sign and leave at the [facility] front desk for pick up and signature on 03/29/18 Follow up with the patient in 7-10 days.   Elayne Guerin, PharmD, [place] Clinical Pharmacist [phone]

## 2018-03-29 ENCOUNTER — Other Ambulatory Visit: Payer: Self-pay | Admitting: Pharmacy Technician

## 2018-03-29 NOTE — Patient Outreach (Signed)
[name]  03/29/2018  Sydney Avila 04-28-1946 [id number]  Received [employer] patient assistance referral from [place] for Trulicity. Faxed provider portion to Dr. Ronnald Ramp, patient came into office and filled out her portion.  [name], [name] [phone]

## 2018-04-01 ENCOUNTER — Telehealth: Payer: Self-pay

## 2018-04-01 DIAGNOSIS — E118 Type 2 diabetes mellitus with unspecified complications: Secondary | ICD-10-CM

## 2018-04-01 MED ORDER — DULAGLUTIDE 0.75 MG/0.5ML ~~LOC~~ SOAJ: 1.0000 | SUBCUTANEOUS | 1 refills | Status: DC

## 2018-04-01 NOTE — Telephone Encounter (Signed)
We have received the patient assistance request for trulicity. Rx has been printed and given to PCP to fill out.

## 2018-04-02 ENCOUNTER — Other Ambulatory Visit: Payer: Self-pay | Admitting: Pharmacy Technician

## 2018-04-02 NOTE — Patient Outreach (Signed)
[facility] [facility]) Care Management  04/02/2018  NOELE ICENHOUR [birth year] [id number]  Received provider portion of [employer] patient assistance application for Trulicity. Faxed completed application and required documents to company.  Will follow up with [employer] in 7-10 business days to check status of application.  Maud Deed [place], [place] Management [phone]

## 2018-04-03 NOTE — Telephone Encounter (Signed)
This has been faxed back.  

## 2018-04-04 ENCOUNTER — Ambulatory Visit: Payer: Self-pay | Admitting: Pharmacist

## 2018-04-08 ENCOUNTER — Other Ambulatory Visit: Payer: Self-pay | Admitting: Pharmacist

## 2018-04-08 NOTE — Patient Outreach (Signed)
[place] [facility]) Care Management  04/08/2018  CRAIG WISNEWSKI [birth year] [id number]   Patient called wondering what the status of her patient assistance application is. HIPAA identifiers were obtained.  Patient's application has been faxed to [facility] as of 04/02/18.    Plan: Caryl Pina will follow up with the patient in 7-10 business days.   Elayne Guerin, PharmD, [place] Clinical Pharmacist [phone]

## 2018-04-09 ENCOUNTER — Other Ambulatory Visit: Payer: Self-pay | Admitting: Pharmacy Technician

## 2018-04-09 NOTE — Patient Outreach (Signed)
[place]) Care Management  04/09/2018  Sydney Avila 1945/12/31 [id number]   [place] to check status of patients application for Trulicity. Rainam stated that the application is still in the process of being reviewed and she was not able to provide a timeframe as to how long that would take.  Successful outreach call to patient, HIPAA identifiers verified. Informed [name] that [employer] is still processing the application and that I will contact them about in the next 2 weeks.  Will follow up with [employer] in 10-14 business days.  Maud Deed [place], [place] Management [phone]

## 2018-04-12 DIAGNOSIS — E1351 Other specified diabetes mellitus with diabetic peripheral angiopathy without gangrene: Secondary | ICD-10-CM | POA: Diagnosis not present

## 2018-04-12 DIAGNOSIS — D2372 Other benign neoplasm of skin of left lower limb, including hip: Secondary | ICD-10-CM | POA: Diagnosis not present

## 2018-04-13 ENCOUNTER — Other Ambulatory Visit: Payer: Self-pay | Admitting: Internal Medicine

## 2018-04-13 DIAGNOSIS — E118 Type 2 diabetes mellitus with unspecified complications: Secondary | ICD-10-CM

## 2018-04-13 DIAGNOSIS — I10 Essential (primary) hypertension: Secondary | ICD-10-CM

## 2018-04-16 ENCOUNTER — Other Ambulatory Visit: Payer: Self-pay | Admitting: Pharmacy Technician

## 2018-04-16 NOTE — Patient Outreach (Signed)
[place] [facility]) Care Management  04/16/2018  Sydney Avila 1945/10/21 [id number]   Follow up call to [place] to check status of patients application for Trulicity. Rainam stated patient was approved on 07/01 however there is not a shipment tracking number in the [employer] system.  Will update patient on being approved.  Successful outreach call to [name], HIPAA identifiers verified. Informed patient that she has been approved for Trulicity and that it will be shipped to Dr. Ronnald Ramp' office.  Will inform Dr. Ronnald Ramp' office of approval.  Maud Deed. [place], [place] Management [phone]

## 2018-04-18 ENCOUNTER — Other Ambulatory Visit: Payer: Self-pay | Admitting: Pharmacist

## 2018-04-18 NOTE — Patient Outreach (Signed)
[facility]) Care Management  04/18/2018  Sydney Avila 1946/04/25 [id number]   Patient was called to follow up on medication assistance. HIPAA identifiers were obtained. Patient confirmed she spoke with Caryl Pina and was told she has been approved through [employer] Patient Assistance Program to receive Trulicity at no cost.   Plan: Caryl Pina will follow up with patient about medication shipment and how to refill.  Elayne Guerin, PharmD, [place] Clinical Pharmacist [phone]

## 2018-04-19 ENCOUNTER — Other Ambulatory Visit: Payer: Self-pay | Admitting: Internal Medicine

## 2018-04-19 DIAGNOSIS — E118 Type 2 diabetes mellitus with unspecified complications: Secondary | ICD-10-CM

## 2018-04-23 ENCOUNTER — Other Ambulatory Visit: Payer: Self-pay | Admitting: Pharmacy Technician

## 2018-04-23 ENCOUNTER — Telehealth: Payer: Self-pay | Admitting: *Deleted

## 2018-04-23 NOTE — Telephone Encounter (Signed)
I called pt- she is requesting 1 sample to last her until she receives her medication from [employer].  Sample is in side A fridge with patient's name/DOB.  SS/CMA   Copied from [place] [phone]. Topic: General - Other >> Apr 23, 2018 10:33 AM Judyann Munson wrote: Reason for CRM:  Patient is requesting a call back in regard to picking up her Dulaglutide (TRULICITY) 5.68 SH/6.8HF SOPN. Please advise    >> Apr 23, 2018 11:11 AM Para Skeans A wrote: I do see anything since June? Are you aware of anything with this?

## 2018-04-23 NOTE — Patient Outreach (Signed)
[facility]) Care Management  04/23/2018  Sydney Avila 06-14-1946 [id number]   Incoming call from patient stating that Dr. Ronnald Ramp office has not received her Trulicity and her next shot is due this weekend, HIPAA identifiers verified.  [employer] and spoke to Cami who stated the system has not been updated with shipping details.  Contacted [employer] and there is no emergency service available. Threasa Beards states that she would try to get more information from [place] about whether medication would be delivered by the weekend.  Contacted patient and let her know the details and provided her with [employer] phone number and she stated that she would follow up with them by the end of the week.  Maud Deed [place], [place] Management [phone]

## 2018-04-24 ENCOUNTER — Telehealth: Payer: Self-pay

## 2018-04-24 NOTE — Telephone Encounter (Signed)
Patient advised that her trulicity medications have arrived, labeled with patient's name and in side A refrig

## 2018-05-09 ENCOUNTER — Telehealth: Payer: Self-pay | Admitting: Pharmacist

## 2018-05-09 ENCOUNTER — Other Ambulatory Visit: Payer: Self-pay | Admitting: Pharmacy Technician

## 2018-05-09 NOTE — Telephone Encounter (Signed)
-----   Message from Adaline Sill, CPhT sent at 05/09/2018 10:52 AM EDT ----- Case closure, removed myself

## 2018-05-09 NOTE — Patient Outreach (Signed)
[place] [facility]) Care Management  05/09/2018  Sydney Avila 02-24-1946 [id number]   Successful outreach to [name], HIPAA identifiers verified. [name] confirmed that she received her Trulicity that was delivered to her providers office from [facility]. Reviewed how to obtain refills from company.   Will route note to [place] for case closure.  Maud Deed [place], [place] Management [phone]

## 2018-05-24 DIAGNOSIS — L309 Dermatitis, unspecified: Secondary | ICD-10-CM | POA: Diagnosis not present

## 2018-05-24 DIAGNOSIS — L218 Other seborrheic dermatitis: Secondary | ICD-10-CM | POA: Diagnosis not present

## 2018-05-24 DIAGNOSIS — L820 Inflamed seborrheic keratosis: Secondary | ICD-10-CM | POA: Diagnosis not present

## 2018-05-27 ENCOUNTER — Other Ambulatory Visit: Payer: Self-pay | Admitting: Internal Medicine

## 2018-05-27 DIAGNOSIS — K9089 Other intestinal malabsorption: Secondary | ICD-10-CM

## 2018-05-30 LAB — HM DIABETES EYE EXAM

## 2018-06-05 ENCOUNTER — Other Ambulatory Visit: Payer: Self-pay | Admitting: Pharmacist

## 2018-06-06 ENCOUNTER — Telehealth: Payer: Self-pay | Admitting: Internal Medicine

## 2018-06-06 MED ORDER — CHOLESTYRAMINE LIGHT 4 GM/DOSE PO POWD: ORAL | 5 refills | Status: DC

## 2018-06-06 NOTE — Telephone Encounter (Signed)
Contacted pharmacy to inquire if medication has sugar; April says that the regular cholestyramine has sucrose; she also says that the pt would like a new prescription for "light" cholestyramine that has aspartame; will route to office for final disposition; pt is normally seen by Dr [name].

## 2018-06-06 NOTE — Patient Outreach (Signed)
[facility]) Care Management  06/06/2018  Sydney Avila Nov 26, 1945 [id number]   Patient called to report she received 4 boxes of Trulicity and said the nurse at her provider's office said that was her supply for the rest of the year. Patient was told that most companies approve patients through the end of the calendar year.  The patient or her provider can inquire about any necessary refills with [employer].   Plan: Patient's case will remain closed. However, she can always call with pharmacy related questions or concerns.   Elayne Guerin, PharmD, [place] Clinical Pharmacist [phone]

## 2018-06-06 NOTE — Addendum Note (Signed)
Addended by: Karle Barr on: 06/06/2018 04:18 PM   Modules accepted: Orders

## 2018-06-06 NOTE — Telephone Encounter (Signed)
Contacted pt regarding request for medication change; made her aware that a new prescription request has been sent Dr Ronnald Ramp; she verbalizes understanding.

## 2018-06-06 NOTE — Telephone Encounter (Signed)
Contacted Sydney Avila. There is a light version that is sugar free available. It is called "cholestyramine light". Can I send this in?

## 2018-06-06 NOTE — Telephone Encounter (Signed)
Copied from [place] [phone]. Topic: Quick Communication - Rx Refill/Question >> Jun 06, 2018 10:57 AM Sheran Luz wrote: Medication: Cholestyramine 4 GM/DOSE MIX 1 SCOOP (4 GM TOTAL) WITH 4 TO 6 OUNCES OF LIQUID AND DRINK BY MOUTH DAILY TAKE 1 SCOOP (4 G TOTAL) BY MOUTH DAILY.   Pt called inquiring if this contained sugar. Pt would like a call back as soon as possible at [phone]

## 2018-06-06 NOTE — Telephone Encounter (Signed)
yes

## 2018-06-06 NOTE — Telephone Encounter (Signed)
lvm for pt informing rx has been sent to [facility] as requested.

## 2018-06-13 ENCOUNTER — Other Ambulatory Visit: Payer: Self-pay | Admitting: Internal Medicine

## 2018-06-18 DIAGNOSIS — H524 Presbyopia: Secondary | ICD-10-CM | POA: Diagnosis not present

## 2018-06-18 DIAGNOSIS — E119 Type 2 diabetes mellitus without complications: Secondary | ICD-10-CM | POA: Diagnosis not present

## 2018-06-18 DIAGNOSIS — Z961 Presence of intraocular lens: Secondary | ICD-10-CM | POA: Diagnosis not present

## 2018-06-30 ENCOUNTER — Other Ambulatory Visit: Payer: Self-pay | Admitting: Internal Medicine

## 2018-06-30 DIAGNOSIS — E118 Type 2 diabetes mellitus with unspecified complications: Secondary | ICD-10-CM

## 2018-07-22 ENCOUNTER — Other Ambulatory Visit: Payer: Self-pay | Admitting: Internal Medicine

## 2018-07-22 DIAGNOSIS — Z1231 Encounter for screening mammogram for malignant neoplasm of breast: Secondary | ICD-10-CM

## 2018-08-12 ENCOUNTER — Telehealth: Payer: Self-pay | Admitting: Internal Medicine

## 2018-08-12 DIAGNOSIS — E118 Type 2 diabetes mellitus with unspecified complications: Secondary | ICD-10-CM

## 2018-08-12 NOTE — Telephone Encounter (Signed)
Copied from [place] [phone]. Topic: General - Other >> Aug 12, 2018  8:23 AM Lennox Solders wrote: Reason for CRM:pt is calling and she only received 3 boxes instead of 4 boxes of trulicity form patient assistant program. Pt does not have enough medication to last until end of year. Please advise

## 2018-08-13 NOTE — Telephone Encounter (Signed)
Called patient and she was not shorted from financial assistance, on her paperwork she was supposed to be given 3 months and that was what she was given, she just needs 2 more pens to get her through to the end of the year, please send in refill for patient.

## 2018-08-13 NOTE — Telephone Encounter (Signed)
Pt contacted and informed that she is authorized for refills for the next fiscal year.

## 2018-08-21 DIAGNOSIS — M79672 Pain in left foot: Secondary | ICD-10-CM | POA: Diagnosis not present

## 2018-08-21 DIAGNOSIS — E1351 Other specified diabetes mellitus with diabetic peripheral angiopathy without gangrene: Secondary | ICD-10-CM | POA: Diagnosis not present

## 2018-08-21 DIAGNOSIS — D2372 Other benign neoplasm of skin of left lower limb, including hip: Secondary | ICD-10-CM | POA: Diagnosis not present

## 2018-08-31 IMAGING — MG DIGITAL SCREENING BILATERAL MAMMOGRAM WITH CAD
4 series · 4 of 4 positions shown · non-contrast
Comparison: Previous exam(s).

CLINICAL DATA: Screening.

EXAM:
DIGITAL SCREENING BILATERAL MAMMOGRAM WITH CAD

[L CC]
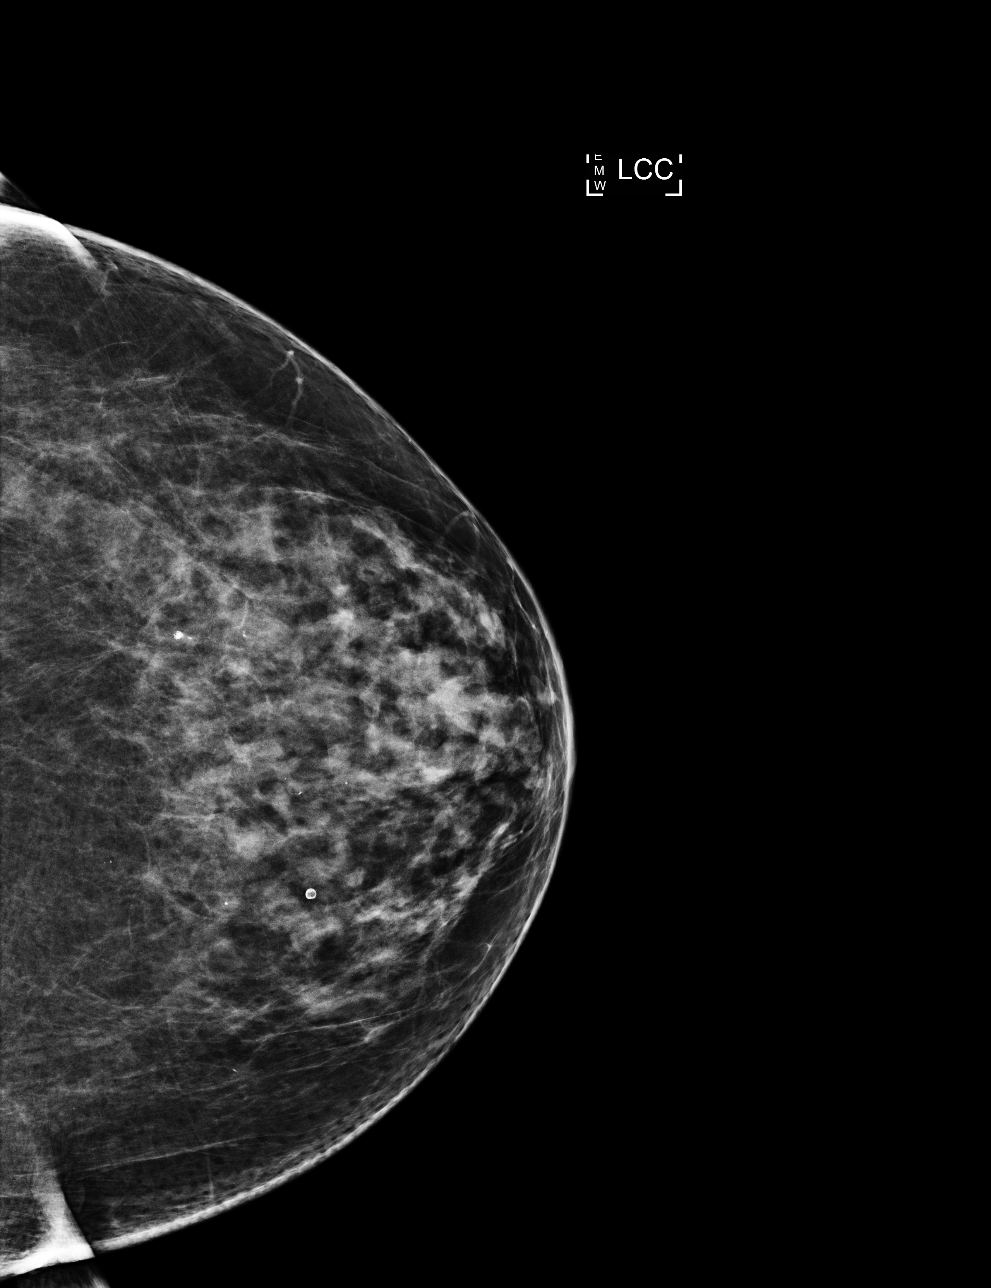

[R MLO]
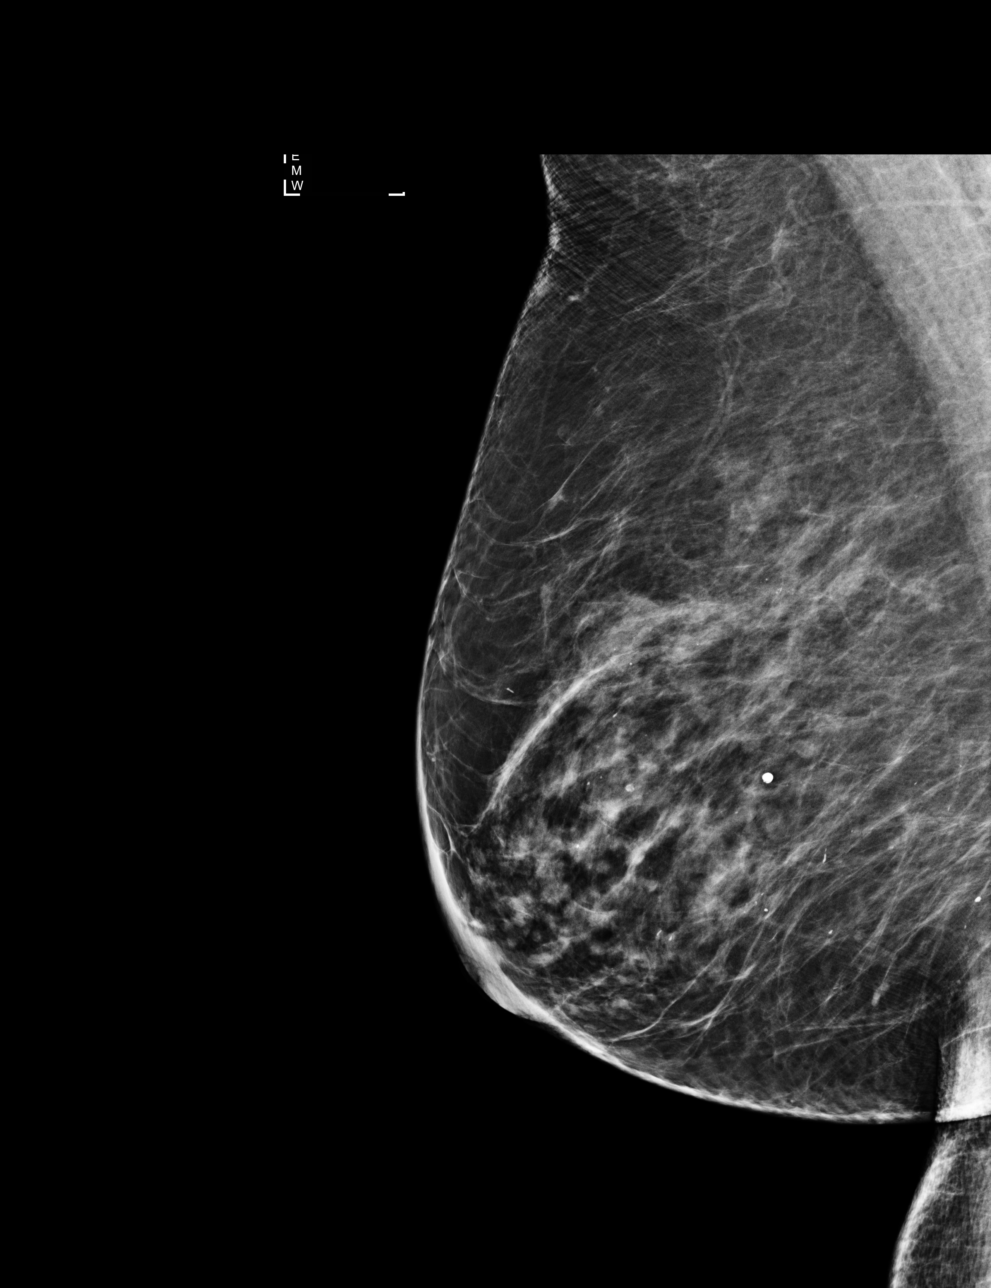

[L MLO]
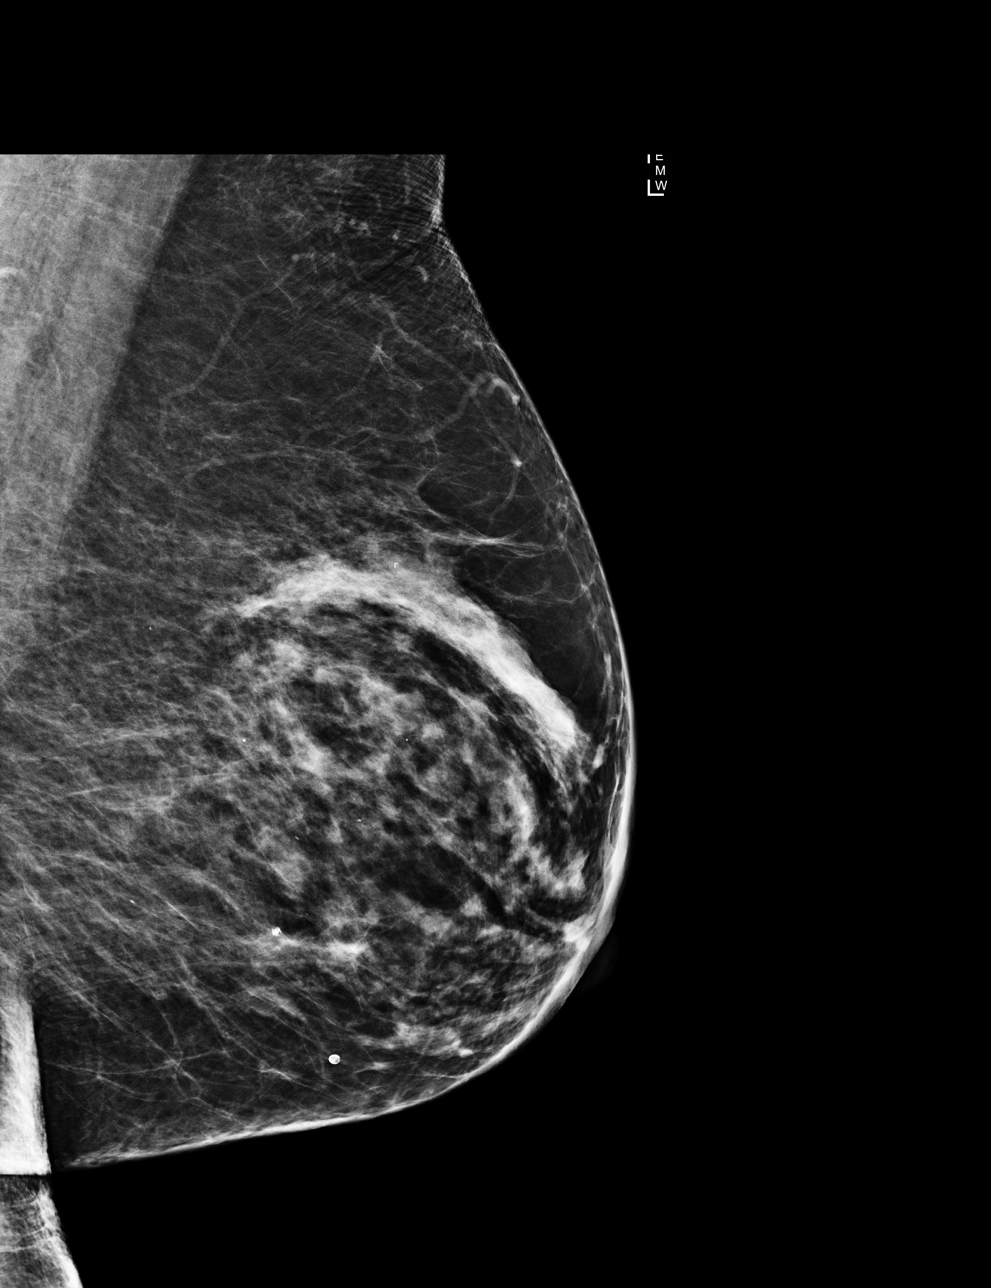

[R CC]
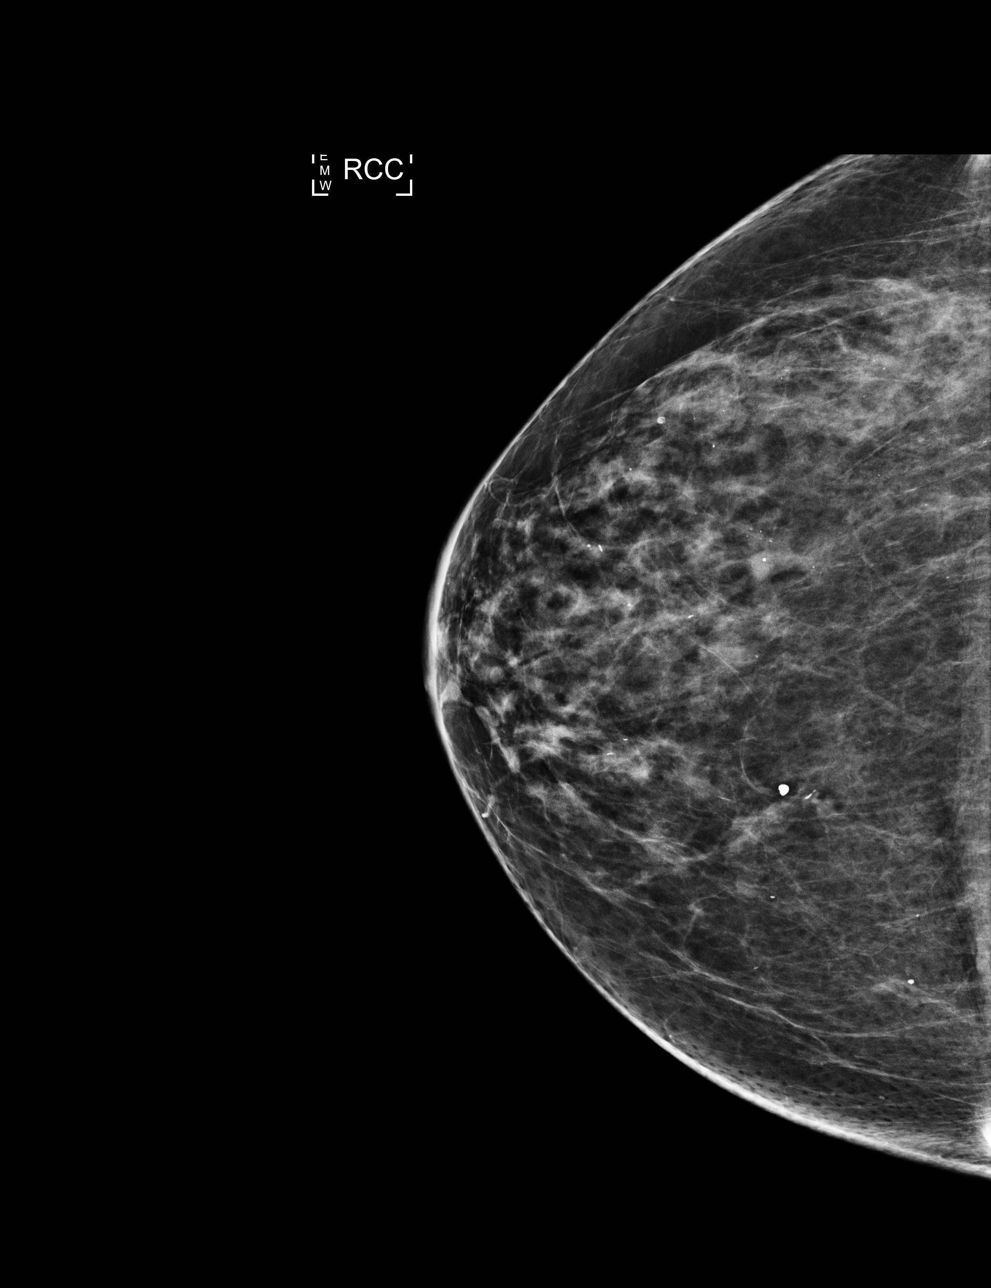

[4 of 4 positions shown; findings below may reference images not displayed]

ACR Breast Density Category c: The breast tissue is heterogeneously
dense, which may obscure small masses.
FINDINGS: There are no findings suspicious for malignancy. Images were
processed with CAD.
IMPRESSION: No mammographic evidence of malignancy. A result letter of this
screening mammogram will be mailed directly to the patient.

RECOMMENDATION:
Screening mammogram in one year. (Code:[id number])

BI-RADS CATEGORY  1: Negative.

## 2018-09-02 ENCOUNTER — Other Ambulatory Visit: Payer: Self-pay | Admitting: Internal Medicine

## 2018-09-02 DIAGNOSIS — F411 Generalized anxiety disorder: Secondary | ICD-10-CM

## 2018-09-11 ENCOUNTER — Ambulatory Visit (HOSPITAL_BASED_OUTPATIENT_CLINIC_OR_DEPARTMENT_OTHER)
Admission: RE | Admit: 2018-09-11 | Discharge: 2018-09-11 | Disposition: A | Discharge status: Home / Self Care | Payer: PPO | Source: Ambulatory Visit | Attending: Internal Medicine | Admitting: Internal Medicine

## 2018-09-11 ENCOUNTER — Other Ambulatory Visit: Payer: Self-pay | Admitting: Pharmacist

## 2018-09-11 DIAGNOSIS — Z1231 Encounter for screening mammogram for malignant neoplasm of breast: Secondary | ICD-10-CM

## 2018-09-11 LAB — HM MAMMOGRAPHY

## 2018-09-11 NOTE — Patient Outreach (Addendum)
[facility]) Care Management  09/11/2018  Sydney Avila 07-25-1946 [id number]   Called patient to begin the 7615 process for Trulicity from [place].  She did not answer her phone. HIPAA compliant message was left on her voicemail.  Review of the patient's chart revealed a representative from Dr. Ronnald Ramp' office documented they called [employer] on 08/12/18 and the patient is approved through the end of 2020.  Plan: Await a call back from the patient.   Elayne Guerin, PharmD, [place] Clinical Pharmacist [phone]   ADDENDUM  Patient called me back. HIPAA identifiers were obtained. Patient said she had not heard anything from her provider's office. There is a note in the patient's chart from 08/12/18-08/13/18 stating someone from Dr. Ronnald Ramp' office called the patient and told her she was approved through June of next year.  It does not look like a refill was requested for the patient at that time.  Just about 99% of the programs only approve patients with Medicare Part D through 12/31 of the Calendar year.  [facility] Certified Pharmacy Technician, Etter Sjogren called [employer] and was informed they need the patient's provider to send in a refill authorization form.  Lilly said they would fax the form to Dr. Ronnald Ramp' office within the hour. An inbox message was sent to [facility] at Dr. Ronnald Ramp' office informing her of the needs as she was the person the patient spoke with on 08/13/18.  Plan:  Follow up in 1-2 business days.   Elayne Guerin, PharmD, [place] Clinical Pharmacist [phone]

## 2018-09-12 ENCOUNTER — Other Ambulatory Visit: Payer: Self-pay | Admitting: Internal Medicine

## 2018-09-12 DIAGNOSIS — E118 Type 2 diabetes mellitus with unspecified complications: Secondary | ICD-10-CM

## 2018-09-12 DIAGNOSIS — E785 Hyperlipidemia, unspecified: Secondary | ICD-10-CM

## 2018-09-12 DIAGNOSIS — I10 Essential (primary) hypertension: Secondary | ICD-10-CM

## 2018-09-12 MED ORDER — SIMVASTATIN 20 MG PO TABS: ORAL_TABLET | ORAL | 1 refills | Status: DC

## 2018-09-12 MED ORDER — DULAGLUTIDE 0.75 MG/0.5ML ~~LOC~~ SOAJ: 1.0000 | SUBCUTANEOUS | 3 refills | Status: DC

## 2018-09-12 MED ORDER — LISINOPRIL 10 MG PO TABS: 10.0000 mg | ORAL_TABLET | Freq: Every day | ORAL | 1 refills | Status: DC

## 2018-09-12 NOTE — Telephone Encounter (Signed)
Patient Assistance Form has been faxed back to lilly cares as requested.

## 2018-09-12 NOTE — Addendum Note (Signed)
Addended by: Aviva Signs M on: 09/12/2018 08:50 AM   Modules accepted: Orders

## 2018-09-13 ENCOUNTER — Other Ambulatory Visit: Payer: Self-pay | Admitting: Pharmacist

## 2018-09-13 ENCOUNTER — Other Ambulatory Visit: Payer: Self-pay | Admitting: Pharmacy Technician

## 2018-09-13 ENCOUNTER — Ambulatory Visit: Payer: Self-pay | Admitting: Pharmacist

## 2018-09-13 NOTE — Patient Outreach (Signed)
[facility]) Care Management  09/13/2018  BRYNLEIGH SEQUEIRA 24-Jun-1946 [id number]   Care coordination call placed to [facility] to inquire about the delivery status of patient's medication.  Spoke to [place] who said they had received the script from the provider's office. She said Margaretha Sheffield in the pharmacy said that the delivery was still in the early stages of process and had not been shipped out yet. She said it would probably be shipped out next week.  Will route note to [place] and [facility] CPhT Etter Sjogren for followup.  Hilarie Sinha P. Ory [place], [place] Management [phone]

## 2018-09-16 ENCOUNTER — Other Ambulatory Visit: Payer: Self-pay | Admitting: Internal Medicine

## 2018-09-16 DIAGNOSIS — E118 Type 2 diabetes mellitus with unspecified complications: Secondary | ICD-10-CM

## 2018-09-16 DIAGNOSIS — I10 Essential (primary) hypertension: Secondary | ICD-10-CM

## 2018-09-16 NOTE — Telephone Encounter (Signed)
Patient is calling to inquire on this.  I did not see anything in the refrigerator.  Please follow up with patient in regard.  States she was told to call in today.

## 2018-09-16 NOTE — Patient Outreach (Signed)
[facility] [place] [facility]) Care Management  09/16/2018  Sydney Avila 26-Apr-1946 [id number]   Called patient. HIPAA identifiers were obtained. Informed patient that her forms were received by [facility] but there was no guarantee about delivery before 09/22/18.  Patient was encouraged to reach out to her provider about obtaining samples.  Plan: Susy Frizzle will follow up with the patient  Elayne Guerin, PharmD, [place] Clinical Pharmacist [phone]

## 2018-09-17 ENCOUNTER — Other Ambulatory Visit: Payer: Self-pay | Admitting: Pharmacy Technician

## 2018-09-17 ENCOUNTER — Ambulatory Visit: Payer: Self-pay | Admitting: Pharmacist

## 2018-09-17 ENCOUNTER — Encounter: Payer: Self-pay | Admitting: Pharmacist

## 2018-09-17 NOTE — Patient Outreach (Signed)
[facility]) Care Management  09/17/2018  [name] [birth year] [id number]  Received 2020 patient assistance referral from [place].   Prepared patient portion to be mailed. Prepared provider portion to be faxed.  Will followup with patient in 7-10 business days to confirm application was received.  Neno Hohensee P. Kebin Maye, [place] Management [phone]

## 2018-09-17 NOTE — Telephone Encounter (Signed)
I have samples if she needs it. I do not know if she does.

## 2018-09-19 ENCOUNTER — Other Ambulatory Visit: Payer: Self-pay | Admitting: Pharmacy Technician

## 2018-09-19 ENCOUNTER — Other Ambulatory Visit: Payer: Self-pay | Admitting: Pharmacist

## 2018-09-19 NOTE — Patient Outreach (Signed)
[facility]) Care Management  09/19/2018  Sydney Avila 11-Feb-1946 [id number]   Called patient regarding medication assistance. HIPAA identifiers were obtained. Patient was encouraged to give her provider a call to request completion of the [place] that were sent to their office.  Patient also shared that she spoke with a representative from [facility] yesterday and did not know who they were so she would not provide them with any HIPAA information.  She said she was told her 3 month shipment of Trulicity would be delivered to Dr. Ronnald Ramp' office today.  Plan: Route note to [employer] as FYI Patient is being proactively followed   Elayne Guerin, PharmD, [place] Clinical Pharmacist [phone]

## 2018-09-19 NOTE — Patient Outreach (Signed)
[place] [facility]) Care Management  09/19/2018  [name] 10/22/1945 [id number]   Incoming call received from [place] at Dr. [name] office in regards to [facility] patient assistance.   Colletta Maryland informed me that they had already taken care of this (provider portion of patient assistance application) for the patient and faxed it to [place] she was inquiring as to why we were faxing it to the provider again.  Informed Colletta Maryland that what was faxed to her  Wilburn Mylar was for the 7290 application and what she had faxed earlier in the month was for the patient to get her last refill of 2019. Colletta Maryland thinks the information provided to [place] earlier this month will suffice for 2020.  Will route note to [place] for followup.  Tonea Leiphart P. Demonie Kassa, [place] Management [phone]

## 2018-09-24 ENCOUNTER — Other Ambulatory Visit: Payer: Self-pay | Admitting: Pharmacy Technician

## 2018-09-24 NOTE — Patient Outreach (Signed)
[facility]) Care Management  09/24/2018  HALLA CHOPP 1946-02-27 [id number]   Incoming call received from patient in regards to her Lilly application for 0164 for Trulicity. HIPPA identifiers verified.  Ms. Aguado called to ask some questions concerning her [employer] application. Discussed with patient the answers to her questions and the requirements of the program. Patient verbalized understanding. Patient states she will place everything in the envelope and place it in the mail today.  Will followup in 7-10 business days if application has not been received.  Gokul Waybright P. Khalil Belote, [place] Management [phone]

## 2018-09-30 ENCOUNTER — Ambulatory Visit: Payer: Self-pay | Admitting: Pharmacist

## 2018-10-11 ENCOUNTER — Other Ambulatory Visit: Payer: Self-pay

## 2018-10-11 DIAGNOSIS — E118 Type 2 diabetes mellitus with unspecified complications: Secondary | ICD-10-CM

## 2018-10-11 MED ORDER — DULAGLUTIDE 0.75 MG/0.5ML ~~LOC~~ SOAJ: 1.0000 | SUBCUTANEOUS | 3 refills | Status: DC

## 2018-10-15 ENCOUNTER — Ambulatory Visit: Payer: Self-pay | Admitting: Pharmacist

## 2018-10-16 ENCOUNTER — Other Ambulatory Visit: Payer: Self-pay | Admitting: Pharmacy Technician

## 2018-10-16 NOTE — Patient Outreach (Signed)
[facility]) Care Management  10/16/2018  JIM PHILEMON 1946-01-09 [id number]   Received all necessary documents and signatures from both patient and provider for Lilly patient assistance for Trulicity.  Submitted completed application via fax to [employer].  Will followup in 10-14 business days to inquire on status of application.  Londynn Sonoda P. [place], [place] Management [phone]

## 2018-10-28 ENCOUNTER — Other Ambulatory Visit: Payer: Self-pay | Admitting: Pharmacy Technician

## 2018-10-28 NOTE — Patient Outreach (Signed)
[facility]) Care Management  10/28/2018  JALIZA SEIFRIED [birth year] [id number]   Care coordination call placed to [place] in regards to patient application for Trulicity.  Spoke to [place] who said the patient had been APPROVED from 10/09/2018-10/09/2019. Patient's medication was delivered to the provider's office on 10/24/2018 and signed for by [facility] per [place]. Estill Bamberg also informed that the patient received 5 boxes.  Will followup with patient to inquire if she had picked up the medication.  Jonmichael Beadnell P. Kamarie Palma, [place] Management [phone]

## 2018-10-28 NOTE — Patient Outreach (Signed)
[place] [facility]) Care Management  10/28/2018  Sydney Avila 1946-05-19 [id number]  ADDENDUM  Unsuccessful call placed to patient in regards to her Lilly application for Trulicity.  Unfortunately patient did not answer the phone. HIPAA compliant voicemail left. Called to inform patient that her medication had been delivered to her provider's office and to inquire if she had picked it up.  Will followup with patient in 3-5 business days.  Sydney Avila P. Naryah Clenney, [place] Management [phone]

## 2018-10-29 ENCOUNTER — Other Ambulatory Visit: Payer: Self-pay | Admitting: Pharmacist

## 2018-10-29 NOTE — Patient Outreach (Signed)
[place] [facility]) Care Management  10/29/2018  Sydney Avila 06-11-1946 [id number]   Patient was called to follow up on medication assistance with Trulicity. HIPAA identifiers were obtained. Chart review showed Sharee Pimple [place] called the patient and left a message that her medication had been shipped to her provider's office.  Patient confirmed that she spoke with someone at her provider's office and would be going to pick up her medication today.  I told the patient I would have Sharee Pimple follow up with her to be sure everything went well but she declined and said she could "handle it".  It was explained to the patient that she would need to reach out to her provider/[facility] about refills and to do so when she gets down to about 30 days worth of therapy.  Patient has been approved through 10/09/2019.  Plan: Close patient's pharmacy case. Send closure letters to patient and her PCP. Route note to [employer], CPhT as [place], PharmD, [place] Clinical Pharmacist [phone]

## 2018-11-04 ENCOUNTER — Other Ambulatory Visit: Payer: Self-pay | Admitting: Pharmacy Technician

## 2018-11-04 ENCOUNTER — Telehealth: Payer: Self-pay | Admitting: Internal Medicine

## 2018-11-04 NOTE — Patient Outreach (Signed)
[facility]) Care Management  11/04/2018  Sydney Avila 1945-11-14 [id number]  Received call from [place] in reference to patient's Trulicity through [place].  Patient called pharmacist to inquire on how to obtain refills on the medication. Pharmacist requested that I followup with patient on how to obtain her refills.  Unsuccessful outreach call placed to patient, HIPAA compliant voicemail left asking for a return call.  [name] P. Aiko Belko, [place] Management [phone]

## 2018-11-04 NOTE — Telephone Encounter (Signed)
Pt is requesting an alternative to the cholestyramine. Please advise if this can be changed.

## 2018-11-04 NOTE — Telephone Encounter (Signed)
Copied from [place] [phone]. Topic: Quick Communication - Rx Refill/Question >> Nov 04, 2018  1:18 PM Waylan Rocher, [place] wrote: Medication: cholestyramine Lucrezia Starch) 4 GM/DOSE powder (pharmacy wants to know if Dr. Ronnald Ramp will send this in pill form because the powder is no longer palatable for the patient and to please note that if the tablet form chosen is not acceoted by insurance that patient can try another brand)  Has the patient contacted their pharmacy? Yes.   (Agent: If no, request that the patient contact the pharmacy for the refill.) (Agent: If yes, when and what did the pharmacy advise?)  Preferred Pharmacy (with phone number or street name): Eugenio [facility] [address] [place] [address] [place] [address] Phone: [phone] Fax: [phone]  Agent: Please be advised that RX refills may take up to 3 business days. We ask that you follow-up with your pharmacy.

## 2018-11-04 NOTE — Telephone Encounter (Signed)
She is taking this for diarrhea.  If she wants to stop taking it then she can just use Imodium as needed for diarrhea

## 2018-11-05 ENCOUNTER — Other Ambulatory Visit: Payer: Self-pay | Admitting: Pharmacy Technician

## 2018-11-05 NOTE — Patient Outreach (Signed)
[place]) Care Management  11/05/2018  Sydney Avila 05-19-1946 [id number]  Incoming call received from patient in regards to [employer] application for Trulicity.  Patient was returning my call from yesterday. Patient states she was inquiring as to how to obtain her refills of Trulicity when she starts to run low. Informed patient that a letter is usually sent via fax to the provider's office. The provider then has to fill out a form and submit back to [place] for processing. The medication will then arrive to the provider's office as before. Instructed patient that she should reach out to her provider's office when she gets down to a 3 week supply to give them a heads up. Patient verbalized understanding. Patient also confirmed having our name and number if additional questions, concerns or problems were to arise in the future.  Terren Haberle P. Masen Luallen, [place] Management [phone]

## 2018-11-06 ENCOUNTER — Other Ambulatory Visit: Payer: Self-pay | Admitting: Internal Medicine

## 2018-11-06 DIAGNOSIS — K9089 Other intestinal malabsorption: Secondary | ICD-10-CM

## 2018-11-06 DIAGNOSIS — E118 Type 2 diabetes mellitus with unspecified complications: Secondary | ICD-10-CM

## 2018-11-06 DIAGNOSIS — E785 Hyperlipidemia, unspecified: Secondary | ICD-10-CM

## 2018-11-06 MED ORDER — COLESEVELAM HCL 3.75 G PO PACK: 1.0000 | PACK | Freq: Every day | ORAL | 1 refills | Status: DC

## 2018-11-06 NOTE — Telephone Encounter (Signed)
Pt informed of PCP - Pt stated that she has tried the immodium over the counter and it is not as helpful in controlling her symptoms. Pt would like to try Welchol or something else. Pharmacy told her they 2 different options available.   The Welchol 625mg  tablets or Colestipol 1 gram tablets look to be covered by her insurance.   Please advise if one of these can be sent in.    Spoke to pharmacy and they are showing same coverage.

## 2018-12-04 DIAGNOSIS — Z7189 Other specified counseling: Secondary | ICD-10-CM | POA: Diagnosis not present

## 2018-12-16 ENCOUNTER — Telehealth: Payer: Self-pay | Admitting: Internal Medicine

## 2018-12-16 DIAGNOSIS — E118 Type 2 diabetes mellitus with unspecified complications: Secondary | ICD-10-CM

## 2018-12-17 MED ORDER — METFORMIN HCL ER 500 MG PO TB24: 1000.0000 mg | ORAL_TABLET | Freq: Two times a day (BID) | ORAL | 1 refills | Status: DC

## 2018-12-17 NOTE — Telephone Encounter (Signed)
erx has been sent in per pt request

## 2018-12-17 NOTE — Addendum Note (Signed)
Addended by: Aviva Signs M on: 12/17/2018 12:58 PM   Modules accepted: Orders

## 2018-12-17 NOTE — Telephone Encounter (Addendum)
Pt called to schedule appt for 01/01/2019. Pt requesting refill on metformin be sent in to cover thru to her appt. Pt prefers 90 day supply due to cost.    [facility] [address] [place] [phone] (Phone) [phone] (Fax)

## 2018-12-23 ENCOUNTER — Encounter: Payer: Self-pay | Admitting: Internal Medicine

## 2018-12-26 DIAGNOSIS — M5417 Radiculopathy, lumbosacral region: Secondary | ICD-10-CM | POA: Diagnosis not present

## 2018-12-26 DIAGNOSIS — M79605 Pain in left leg: Secondary | ICD-10-CM | POA: Diagnosis not present

## 2019-01-01 ENCOUNTER — Ambulatory Visit: Payer: PPO | Admitting: Internal Medicine

## 2019-02-17 ENCOUNTER — Telehealth: Payer: Self-pay | Admitting: Internal Medicine

## 2019-02-17 NOTE — Telephone Encounter (Signed)
Copied from [place] [phone]. Topic: General - Other >> Feb 17, 2019 10:04 AM Lennox Solders wrote: Reason for CRM: pt is calling and needs one can of cholestyramine light powder send to [facility]. [facility] [address]

## 2019-02-17 NOTE — Telephone Encounter (Signed)
Copied from [place] [phone]. Topic: General - Other >> Feb 17, 2019 10:28 AM Parke Poisson wrote: Reason for CRM: Pt is requesting that office fill out refill request form for Dulaglutide (TRULICITY) 8.83 GX/4.1PF SOPN for pt to have delivered to her home through [place].Web site to get form is [email or web address].Pt seems to think you have these in office already .

## 2019-02-18 ENCOUNTER — Other Ambulatory Visit: Payer: Self-pay | Admitting: Internal Medicine

## 2019-02-18 DIAGNOSIS — K9089 Other intestinal malabsorption: Secondary | ICD-10-CM

## 2019-02-18 DIAGNOSIS — E118 Type 2 diabetes mellitus with unspecified complications: Secondary | ICD-10-CM

## 2019-02-18 DIAGNOSIS — E785 Hyperlipidemia, unspecified: Secondary | ICD-10-CM

## 2019-02-18 MED ORDER — COLESEVELAM HCL 3.75 G PO PACK: 1.0000 | PACK | Freq: Every day | ORAL | 2 refills | Status: DC

## 2019-02-18 NOTE — Telephone Encounter (Signed)
Form printed and filled out, given to PCP to sign.

## 2019-02-18 NOTE — Telephone Encounter (Signed)
lvm for pt informing that fax has been sent to [place] [facility] as requested.

## 2019-02-20 ENCOUNTER — Telehealth: Payer: Self-pay | Admitting: Internal Medicine

## 2019-02-20 DIAGNOSIS — D2372 Other benign neoplasm of skin of left lower limb, including hip: Secondary | ICD-10-CM | POA: Diagnosis not present

## 2019-02-20 DIAGNOSIS — M21962 Unspecified acquired deformity of left lower leg: Secondary | ICD-10-CM | POA: Diagnosis not present

## 2019-02-20 DIAGNOSIS — E1351 Other specified diabetes mellitus with diabetic peripheral angiopathy without gangrene: Secondary | ICD-10-CM | POA: Diagnosis not present

## 2019-02-20 NOTE — Telephone Encounter (Signed)
Copied from [place] [phone]. Topic: Quick Communication - See Telephone Encounter >> Feb 20, 2019  4:51 PM Vernona Rieger wrote: CRM for notification. See Telephone encounter for: 02/20/19.  Pt states she needs a script for cholestyramine light (PREVALITE) 4 GM/DOSE powder [id number] [place], [place] (Phone) [phone] (Fax)  She called them and they do have it. She said Kristopher Oppenheim does not have this.

## 2019-02-21 MED ORDER — CHOLESTYRAMINE LIGHT 4 GM/DOSE PO POWD: ORAL | 0 refills | Status: DC

## 2019-02-21 NOTE — Telephone Encounter (Signed)
Pt is due for follow-up sent 1 month supply until she f/u w/MD../lmb

## 2019-02-24 ENCOUNTER — Other Ambulatory Visit: Payer: Self-pay | Admitting: Internal Medicine

## 2019-02-24 DIAGNOSIS — K9089 Other intestinal malabsorption: Secondary | ICD-10-CM

## 2019-02-24 MED ORDER — CHOLESTYRAMINE LIGHT 4 GM/DOSE PO POWD: 2.0000 g | Freq: Three times a day (TID) | ORAL | 12 refills | Status: DC

## 2019-03-03 ENCOUNTER — Other Ambulatory Visit: Payer: Self-pay | Admitting: Internal Medicine

## 2019-03-03 DIAGNOSIS — F411 Generalized anxiety disorder: Secondary | ICD-10-CM

## 2019-03-04 ENCOUNTER — Telehealth: Payer: Self-pay | Admitting: Internal Medicine

## 2019-03-04 DIAGNOSIS — F411 Generalized anxiety disorder: Secondary | ICD-10-CM

## 2019-03-05 ENCOUNTER — Other Ambulatory Visit: Payer: Self-pay | Admitting: Internal Medicine

## 2019-03-05 DIAGNOSIS — F411 Generalized anxiety disorder: Secondary | ICD-10-CM

## 2019-03-05 MED ORDER — ALPRAZOLAM 0.5 MG PO TABS: ORAL_TABLET | ORAL | 0 refills | Status: DC

## 2019-03-05 NOTE — Telephone Encounter (Signed)
Pt has been contacted and informed the reason of the denied rx. Pt has made an appointment for July 15th. I asked her to come in earlier but pt declined as she is concerned about coming in at all.   Can pt is requesting a refill of alprazolam to get her to that appointment?

## 2019-03-05 NOTE — Telephone Encounter (Signed)
Pt called to follow up on refill for alprazolam and why it was denied. She would like a call from [place]. Please advise.

## 2019-03-06 ENCOUNTER — Other Ambulatory Visit: Payer: Self-pay | Admitting: Internal Medicine

## 2019-03-06 DIAGNOSIS — E118 Type 2 diabetes mellitus with unspecified complications: Secondary | ICD-10-CM

## 2019-03-06 DIAGNOSIS — I10 Essential (primary) hypertension: Secondary | ICD-10-CM

## 2019-03-11 ENCOUNTER — Other Ambulatory Visit: Payer: Self-pay | Admitting: Internal Medicine

## 2019-03-11 DIAGNOSIS — I10 Essential (primary) hypertension: Secondary | ICD-10-CM

## 2019-03-11 DIAGNOSIS — E118 Type 2 diabetes mellitus with unspecified complications: Secondary | ICD-10-CM

## 2019-04-10 ENCOUNTER — Other Ambulatory Visit: Payer: Self-pay | Admitting: Internal Medicine

## 2019-04-13 ENCOUNTER — Telehealth: Payer: Self-pay | Admitting: Internal Medicine

## 2019-04-13 DIAGNOSIS — F411 Generalized anxiety disorder: Secondary | ICD-10-CM

## 2019-04-14 MED ORDER — GLUCOSE BLOOD VI STRP: ORAL_STRIP | 2 refills | Status: DC

## 2019-04-14 NOTE — Telephone Encounter (Signed)
Pt calling back to see if she can gt this Rx filled, ALPRAZolam (XANAX) 0.5 MG tablet  enough to get her through to her appt on 7/15?  Pt states she also needs glucose blood (ONE TOUCH ULTRA TEST) test strip  If these can be sent to pharmacy.  [facility] [address] [place] [id number] (Phone) [phone] (Fax)

## 2019-04-14 NOTE — Addendum Note (Signed)
Addended by: Aviva Signs M on: 04/14/2019 10:04 AM   Modules accepted: Orders

## 2019-04-14 NOTE — Telephone Encounter (Signed)
Patient called regarding this medication refill.  She is scheduled to be seen on 04/23/2019. See telephone encounter from 03/04/2019.

## 2019-04-14 NOTE — Telephone Encounter (Signed)
ers for test strips done.   Please advise on the alprazolam rf rq.

## 2019-04-17 ENCOUNTER — Other Ambulatory Visit: Payer: Self-pay | Admitting: Internal Medicine

## 2019-04-17 DIAGNOSIS — F411 Generalized anxiety disorder: Secondary | ICD-10-CM

## 2019-04-23 ENCOUNTER — Encounter: Payer: Self-pay | Admitting: Internal Medicine

## 2019-04-23 ENCOUNTER — Ambulatory Visit (INDEPENDENT_AMBULATORY_CARE_PROVIDER_SITE_OTHER): Payer: PPO | Admitting: Internal Medicine

## 2019-04-23 ENCOUNTER — Other Ambulatory Visit (INDEPENDENT_AMBULATORY_CARE_PROVIDER_SITE_OTHER): Payer: PPO

## 2019-04-23 ENCOUNTER — Other Ambulatory Visit: Payer: Self-pay

## 2019-04-23 VITALS — BP 146/76 | HR 73 | Temp 98.3°F | Ht 61.75 in | Wt 159.0 lb

## 2019-04-23 DIAGNOSIS — E785 Hyperlipidemia, unspecified: Secondary | ICD-10-CM

## 2019-04-23 DIAGNOSIS — I10 Essential (primary) hypertension: Secondary | ICD-10-CM

## 2019-04-23 DIAGNOSIS — L301 Dyshidrosis [pompholyx]: Secondary | ICD-10-CM

## 2019-04-23 DIAGNOSIS — E118 Type 2 diabetes mellitus with unspecified complications: Secondary | ICD-10-CM | POA: Diagnosis not present

## 2019-04-23 DIAGNOSIS — F411 Generalized anxiety disorder: Secondary | ICD-10-CM | POA: Diagnosis not present

## 2019-04-23 LAB — MICROALBUMIN / CREATININE URINE RATIO
Creatinine,U: 47.2 mg/dL
Microalb Creat Ratio: 1.5 mg/g (ref 0.0–30.0)
Microalb, Ur: <0.7 mg/dL (ref 0.0–1.9)

## 2019-04-23 LAB — URINALYSIS, ROUTINE W REFLEX MICROSCOPIC
Bilirubin Urine: NEGATIVE
Hgb urine dipstick: NEGATIVE
Ketones, ur: NEGATIVE
Leukocytes,Ua: NEGATIVE
Nitrite: NEGATIVE
RBC / HPF: NONE SEEN (ref 0–?)
Specific Gravity, Urine: 1.01 (ref 1.000–1.030)
Total Protein, Urine: NEGATIVE
Urine Glucose: NEGATIVE
Urobilinogen, UA: 0.2 (ref 0.0–1.0)
WBC, UA: NONE SEEN (ref 0–?)
pH: 5 (ref 5.0–8.0)

## 2019-04-23 LAB — CBC WITH DIFFERENTIAL/PLATELET
Basophils Absolute: 0.1 10*3/uL (ref 0.0–0.1)
Basophils Relative: 1 % (ref 0.0–3.0)
Eosinophils Absolute: 0.2 10*3/uL (ref 0.0–0.7)
Eosinophils Relative: 2.4 % (ref 0.0–5.0)
HCT: 41.4 % (ref 36.0–46.0)
Hemoglobin: 13.6 g/dL (ref 12.0–15.0)
Lymphocytes Relative: 31 % (ref 12.0–46.0)
Lymphs Abs: 2.1 10*3/uL (ref 0.7–4.0)
MCHC: 32.8 g/dL (ref 30.0–36.0)
MCV: 91.2 fl (ref 78.0–100.0)
Monocytes Absolute: 0.5 10*3/uL (ref 0.1–1.0)
Monocytes Relative: 6.9 % (ref 3.0–12.0)
Neutro Abs: 4.1 10*3/uL (ref 1.4–7.7)
Neutrophils Relative %: 58.7 % (ref 43.0–77.0)
Platelets: 222 10*3/uL (ref 150.0–400.0)
RBC: 4.54 Mil/uL (ref 3.87–5.11)
RDW: 13.3 % (ref 11.5–15.5)
WBC: 6.9 10*3/uL (ref 4.0–10.5)

## 2019-04-23 LAB — LIPID PANEL
Cholesterol: 111 mg/dL (ref 0–200)
HDL: 43.7 mg/dL (ref >39.00)
LDL Cholesterol: 28 mg/dL (ref 0–99)
NonHDL: 67.47
Total CHOL/HDL Ratio: 3
Triglycerides: 196 mg/dL — ABNORMAL HIGH (ref 0.0–149.0)
VLDL: 39.2 mg/dL (ref 0.0–40.0)

## 2019-04-23 LAB — HEPATIC FUNCTION PANEL
ALT: 14 U/L (ref 0–35)
AST: 15 U/L (ref 0–37)
Albumin: 4.7 g/dL (ref 3.5–5.2)
Alkaline Phosphatase: 69 U/L (ref 39–117)
Bilirubin, Direct: 0.2 mg/dL (ref 0.0–0.3)
Total Bilirubin: 1 mg/dL (ref 0.2–1.2)
Total Protein: 6.7 g/dL (ref 6.0–8.3)

## 2019-04-23 LAB — BASIC METABOLIC PANEL
BUN: 13 mg/dL (ref 6–23)
CO2: 25 mEq/L (ref 19–32)
Calcium: 9.1 mg/dL (ref 8.4–10.5)
Chloride: 104 mEq/L (ref 96–112)
Creatinine, Ser: 0.91 mg/dL (ref 0.40–1.20)
GFR: 60.2 mL/min (ref >60.00)
Glucose, Bld: 157 mg/dL — ABNORMAL HIGH (ref 70–99)
Potassium: 4.5 mEq/L (ref 3.5–5.1)
Sodium: 138 mEq/L (ref 135–145)

## 2019-04-23 LAB — HEMOGLOBIN A1C: Hgb A1c MFr Bld: 6.9 % — ABNORMAL HIGH (ref 4.6–6.5)

## 2019-04-23 LAB — TSH: TSH: 3.34 u[IU]/mL (ref 0.35–4.50)

## 2019-04-23 MED ORDER — LISINOPRIL 10 MG PO TABS: 10.0000 mg | ORAL_TABLET | Freq: Every day | ORAL | 1 refills | Status: DC

## 2019-04-23 MED ORDER — LEVOCETIRIZINE DIHYDROCHLORIDE 5 MG PO TABS: 5.0000 mg | ORAL_TABLET | Freq: Every evening | ORAL | 1 refills | Status: DC

## 2019-04-23 MED ORDER — EUCRISA 2 % EX OINT: 1.0000 | TOPICAL_OINTMENT | Freq: Two times a day (BID) | CUTANEOUS | 3 refills | Status: DC

## 2019-04-23 MED ORDER — ALPRAZOLAM 0.5 MG PO TABS: ORAL_TABLET | ORAL | 3 refills | Status: DC

## 2019-04-23 NOTE — Progress Notes (Signed)
Subjective:  Patient ID: Sydney Avila, female    DOB: [birth year]  Age: 72 y.o. MRN: [id number]  CC: Hypertension, Diabetes, Hyperlipidemia, and Rash   HPI Sydney Avila presents for f/up - She complains of an itchy rash on the anterior aspect of her left lower leg for about 2 weeks.  She has been treating it with an over-the-counter topical agent that has not helped much.  She has a history of eczema.  She tells me her blood pressure and blood sugars have been well controlled.  She is active and denies any recent episodes of CP or DOE.  Outpatient Medications Prior to Visit  Medication Sig Dispense Refill  . cholestyramine light (PREVALITE) 4 GM/DOSE powder Take 0.5 packets (2 g total) by mouth 3 (three) times daily with meals. 239.4 g 12  . Colesevelam HCl 3.75 g PACK Take 1 packet by mouth daily. 30 each 2  . glucose blood (ONE TOUCH ULTRA TEST) test strip USE TO CHECK BLOOD SUGAR TWICE PER DAY 100 each 2  . ALPRAZolam (XANAX) 0.5 MG tablet TAKE ONE TABLET BY MOUTH TWICE A DAY AS NEEDED FOR ANXIETY 45 tablet 0  . Dulaglutide (TRULICITY) 8.92 JJ/9.4RD SOPN Inject 1 Act into the skin once a week. 12 pen 3  . fluocinonide ointment (LIDEX) 4.08 % Apply 1 application topically as needed.     Marland Kitchen lisinopril (ZESTRIL) 10 MG tablet TAKE ONE TABLET BY MOUTH DAILY 90 tablet 0  . metFORMIN (GLUCOPHAGE-XR) 500 MG 24 hr tablet Take 2 tablets (1,000 mg total) by mouth 2 (two) times daily with a meal. 360 tablet 1  . simvastatin (ZOCOR) 20 MG tablet TAKE 1 TABLET (20 MG TOTAL) BY MOUTH EVERY EVENING. 90 tablet 1   No facility-administered medications prior to visit.     ROS Review of Systems  Constitutional: Negative.  Negative for diaphoresis, fatigue and unexpected weight change.  HENT: Negative.   Eyes: Negative for visual disturbance.  Respiratory: Negative for cough, chest tightness, shortness of breath and wheezing.   Cardiovascular: Negative for chest pain, palpitations and leg  swelling.  Gastrointestinal: Negative for abdominal pain, constipation, diarrhea, nausea and vomiting.  Endocrine: Negative.  Negative for polyuria.  Genitourinary: Negative.  Negative for decreased urine volume, difficulty urinating, dysuria and urgency.  Musculoskeletal: Negative for arthralgias and myalgias.  Skin: Positive for rash.  Neurological: Negative.  Negative for dizziness, weakness and light-headedness.  Hematological: Negative for adenopathy. Does not bruise/bleed easily.  Psychiatric/Behavioral: Positive for sleep disturbance. Negative for confusion, decreased concentration, dysphoric mood and suicidal ideas. The patient is nervous/anxious.     Objective:  BP (!) 146/76 (BP Location: Left Arm, Patient Position: Sitting, Cuff Size: Normal)   Pulse 73   Temp 98.3 F (36.8 C) (Oral)   Ht 5' 1.75" (1.568 m)   Wt 159 lb (72.1 kg)   SpO2 93%   BMI 29.32 kg/m   BP Readings from Last 3 Encounters:  04/23/19 (!) 146/76  01/30/18 130/72  07/26/17 (!) 158/80    Wt Readings from Last 3 Encounters:  04/23/19 159 lb (72.1 kg)  01/30/18 163 lb (73.9 kg)  07/26/17 171 lb (77.6 kg)    Physical Exam Vitals signs reviewed.  Constitutional:      Appearance: She is obese. She is not ill-appearing or diaphoretic.  HENT:     Nose: Nose normal. No congestion or rhinorrhea.     Mouth/Throat:     Mouth: Mucous membranes are moist.  Eyes:  General: No scleral icterus.    Conjunctiva/sclera: Conjunctivae normal.  Neck:     Musculoskeletal: Normal range of motion. No neck rigidity or muscular tenderness.  Cardiovascular:     Rate and Rhythm: Normal rate and regular rhythm.     Heart sounds: No murmur.  Pulmonary:     Effort: Pulmonary effort is normal.     Breath sounds: No stridor. No wheezing, rhonchi or rales.  Abdominal:     General: Abdomen is protuberant. Bowel sounds are normal.     Palpations: There is no hepatomegaly or splenomegaly.     Tenderness: There is no  abdominal tenderness.  Musculoskeletal: Normal range of motion.     Right lower leg: No edema.     Left lower leg: No edema.       Legs:  Lymphadenopathy:     Cervical: No cervical adenopathy.  Skin:    Findings: Rash present. No abrasion or wound. Rash is papular. Rash is not pustular, urticarial or vesicular.  Neurological:     General: No focal deficit present.     Mental Status: She is alert.  Psychiatric:        Mood and Affect: Mood normal.        Behavior: Behavior normal.     Lab Results  Component Value Date   WBC 6.9 04/23/2019   HGB 13.6 04/23/2019   HCT 41.4 04/23/2019   PLT 222.0 04/23/2019   GLUCOSE 157 (H) 04/23/2019   CHOL 111 04/23/2019   TRIG 196.0 (H) 04/23/2019   HDL 43.70 04/23/2019   LDLDIRECT 47.0 10/11/2016   LDLCALC 28 04/23/2019   ALT 14 04/23/2019   AST 15 04/23/2019   NA 138 04/23/2019   K 4.5 04/23/2019   CL 104 04/23/2019   CREATININE 0.91 04/23/2019   BUN 13 04/23/2019   CO2 25 04/23/2019   TSH 3.34 04/23/2019   HGBA1C 6.9 (H) 04/23/2019   MICROALBUR <0.7 04/23/2019    Mm 3d Screen Breast Bilateral  Result Date: 09/11/2018 CLINICAL DATA:  Screening. EXAM: DIGITAL SCREENING BILATERAL MAMMOGRAM WITH TOMO AND CAD COMPARISON:  Previous exam(s). ACR Breast Density Category c: The breast tissue is heterogeneously dense, which may obscure small masses. FINDINGS: There are no findings suspicious for malignancy. Images were processed with CAD. IMPRESSION: No mammographic evidence of malignancy. A result letter of this screening mammogram will be mailed directly to the patient. RECOMMENDATION: Screening mammogram in one year. (Code:SM-B-01Y) BI-RADS CATEGORY  1: Negative. Electronically Signed   By: Everlean Alstrom M.D.   On: 09/11/2018 13:26    Assessment & Plan:   Sydney Avila was seen today for hypertension, diabetes, hyperlipidemia and rash.  Diagnoses and all orders for this visit:  Essential hypertension, benign- Her blood pressure is not  quite adequately well controlled but she is not willing to increase the dose of the ACE inhibitor or to add another medication.  She will work on her lifestyle modifications. -     CBC with Differential/Platelet; Future -     TSH; Future -     Urinalysis, Routine w reflex microscopic; Future -     Basic metabolic panel; Future -     lisinopril (ZESTRIL) 10 MG tablet; Take 1 tablet (10 mg total) by mouth daily.  Type II diabetes mellitus with manifestations ([place])- Her A1c is at 6.9%.  She has acceptable glycemic control. -     Hemoglobin A1c; Future -     Microalbumin / creatinine urine ratio; Future -  HM Diabetes Foot Exam -     lisinopril (ZESTRIL) 10 MG tablet; Take 1 tablet (10 mg total) by mouth daily.  Hyperlipidemia with target LDL less than 100- She has achieved her LDL goal is doing well on the statin. -     Lipid panel; Future -     TSH; Future -     Hepatic function panel; Future  Eczema, dyshidrotic -     Crisaborole (EUCRISA) 2 % OINT; Apply 1 Act topically 2 (two) times a day. -     levocetirizine (XYZAL) 5 MG tablet; Take 1 tablet (5 mg total) by mouth every evening.  Type 2 diabetes mellitus with complication, without long-term current use of insulin (HCC) -     lisinopril (ZESTRIL) 10 MG tablet; Take 1 tablet (10 mg total) by mouth daily.  GAD (generalized anxiety disorder) -     ALPRAZolam (XANAX) 0.5 MG tablet; TAKE ONE TABLET BY MOUTH TWICE A DAY AS NEEDED FOR ANXIETY   I have discontinued Lemmie Evens. Branch "Sue"'s fluocinonide ointment. I have also changed her lisinopril. Additionally, I am having her start on Eucrisa and levocetirizine. Lastly, I am having her maintain her Colesevelam HCl, cholestyramine light, glucose blood, ALPRAZolam, metFORMIN, simvastatin, and Trulicity.  Meds ordered this encounter  Medications  . Crisaborole (EUCRISA) 2 % OINT    Sig: Apply 1 Act topically 2 (two) times a day.    Dispense:  100 g    Refill:  3  . lisinopril (ZESTRIL)  10 MG tablet    Sig: Take 1 tablet (10 mg total) by mouth daily.    Dispense:  90 tablet    Refill:  1  . ALPRAZolam (XANAX) 0.5 MG tablet    Sig: TAKE ONE TABLET BY MOUTH TWICE A DAY AS NEEDED FOR ANXIETY    Dispense:  45 tablet    Refill:  3  . levocetirizine (XYZAL) 5 MG tablet    Sig: Take 1 tablet (5 mg total) by mouth every evening.    Dispense:  90 tablet    Refill:  1  . metFORMIN (GLUCOPHAGE-XR) 500 MG 24 hr tablet    Sig: Take 2 tablets (1,000 mg total) by mouth 2 (two) times daily with a meal.    Dispense:  360 tablet    Refill:  1  . simvastatin (ZOCOR) 20 MG tablet    Sig: TAKE 1 TABLET (20 MG TOTAL) BY MOUTH EVERY EVENING.    Dispense:  90 tablet    Refill:  1  . Dulaglutide (TRULICITY) 7.74 JO/8.7OM SOPN    Sig: Inject 1 Act into the skin once a week.    Dispense:  12 pen    Refill:  1     Follow-up: Return in about 4 months (around 08/24/2019).  [name], MD

## 2019-04-23 NOTE — Patient Instructions (Signed)

## 2019-04-24 MED ORDER — SIMVASTATIN 20 MG PO TABS: ORAL_TABLET | ORAL | 1 refills | Status: DC

## 2019-04-24 MED ORDER — TRULICITY 0.75 MG/0.5ML ~~LOC~~ SOAJ: 1.0000 | SUBCUTANEOUS | 1 refills | Status: DC

## 2019-04-24 MED ORDER — METFORMIN HCL ER 500 MG PO TB24: 1000.0000 mg | ORAL_TABLET | Freq: Two times a day (BID) | ORAL | 1 refills | Status: DC

## 2019-04-29 ENCOUNTER — Encounter: Payer: Self-pay | Admitting: Internal Medicine

## 2019-04-30 ENCOUNTER — Other Ambulatory Visit: Payer: Self-pay | Admitting: Internal Medicine

## 2019-04-30 ENCOUNTER — Other Ambulatory Visit: Payer: Self-pay | Admitting: Pharmacist

## 2019-04-30 ENCOUNTER — Telehealth: Payer: Self-pay | Admitting: Internal Medicine

## 2019-04-30 DIAGNOSIS — K9089 Other intestinal malabsorption: Secondary | ICD-10-CM

## 2019-04-30 MED ORDER — CHOLESTYRAMINE 4 GM/DOSE PO POWD: ORAL | 5 refills | Status: DC

## 2019-04-30 NOTE — Patient Outreach (Signed)
[place] [facility]) Care Management  04/30/2019  Sydney Avila August 04, 1946 [id number]   Patient called about getting Trulicity from [facility].  She said she called her provider and then her Trulicity was filled at [facility].  HIPAA identifiers were obtained.  [facility] has changed their process and they now use Social research officer, government as a Visual merchandiser for their program.  When they transitioned to [employer] earlier this year, all of the patient records did not move over.    An inbox message was sent to Dr. Ronnald Ramp' CMA. A phone message was also left at Dr. Ronnald Ramp' office regarding having a new prescription called into [facility].  Plan: Follow up in 5-7 business days.  Elayne Guerin, PharmD, [place] Clinical Pharmacist [phone]

## 2019-04-30 NOTE — Telephone Encounter (Signed)
Do you know if this medication will be delivered to pt?

## 2019-04-30 NOTE — Telephone Encounter (Signed)
Copied from [place] [id number]. Topic: General - Other >> Apr 30, 2019  4:59 PM Keene Breath wrote: Reason for CRM: [facility] Pharmacist called to inform the nurse to send the patient's prescription Trulicity to a different pharmacy, [facility], [place] - [address] [phone] (Phone) [phone] (Fax), because the patient is in an assistance program.  If there are any questions, please call at [phone]

## 2019-04-30 NOTE — Telephone Encounter (Signed)
Copied from [place] [id number]. Topic: Quick Communication - Rx Refill/Question >> Apr 30, 2019 11:32 AM Erick Blinks wrote: Medication: cholestyramine light (PREVALITE) 4 GM/DOSE powder - Pt is requesting that we prescribe Rx in can form instead of packet form due to insurance purposes. She is asking for can form specifically. Please advise.  Has the patient contacted their pharmacy? Yes.   (Agent: If no, request that the patient contact the pharmacy for the refill.) (Agent: If yes, when and what did the pharmacy advise?)  Preferred Pharmacy (with phone number or street name):  [id number] - [place], [address] [place] [address] Phone: [phone] Fax: [phone]   Agent: Please be advised that RX refills may take up to 3 business days. We ask that you follow-up with your pharmacy.

## 2019-05-01 ENCOUNTER — Other Ambulatory Visit: Payer: Self-pay | Admitting: Internal Medicine

## 2019-05-01 DIAGNOSIS — E118 Type 2 diabetes mellitus with unspecified complications: Secondary | ICD-10-CM

## 2019-05-01 MED ORDER — TRULICITY 0.75 MG/0.5ML ~~LOC~~ SOAJ: 1.0000 | SUBCUTANEOUS | 1 refills | Status: DC

## 2019-05-05 ENCOUNTER — Telehealth: Payer: Self-pay

## 2019-05-05 ENCOUNTER — Telehealth: Payer: Self-pay | Admitting: Pharmacist

## 2019-05-05 DIAGNOSIS — E118 Type 2 diabetes mellitus with unspecified complications: Secondary | ICD-10-CM

## 2019-05-05 MED ORDER — TRULICITY 0.75 MG/0.5ML ~~LOC~~ SOAJ: 1.0000 | SUBCUTANEOUS | 3 refills | Status: DC

## 2019-05-05 NOTE — Telephone Encounter (Signed)
-----   Message from Elayne Guerin, [facility] sent at 05/05/2019  8:55 AM EDT ----- Regarding: RE: Delene Loll It can be escribed. I added [facility] to her clinical demographics ----- Message ----- From: Johney Frame, Adelina Mings, CMA Sent: 05/05/2019   8:23 AM EDT To: Elayne Guerin, RPH Subject: RE: Trulicty                                   Can this be escribed or do they need a paper rx faxed?  ----- Message ----- From: Elayne Guerin, [facility] Sent: 04/30/2019   9:04 AM EDT To: Aviva Signs, CMA Subject: Leandra Kern morning! My name is Denyse Amass and I am a [facility] Clinical Pharmacist.  We helped Ms. Schnurr get approved through [employer] to receive Trulicity at no cost.  Since the beginning of the year, [facility] has changed their process.  A new prescription needs to be sent to [facility] as they are the new dispensing pharmacy for the [facility].    Can you send a new prescription to them for the patient?  It will be delivered to her home.    Blessings,   Elayne Guerin, PharmD, [place] Clinical Pharmacist [phone]

## 2019-05-05 NOTE — Telephone Encounter (Signed)
erx has been sent.  

## 2019-05-05 NOTE — Patient Outreach (Signed)
[facility]) Care Management  05/05/2019  XYLAH EARLY 05-Jan-1946 [id number]   Patient was called to follow up on medication assistance. HIPAA identifiers were obtained.  Patient was informed about her prescription being called in to [facility].   Patient was given the phone number to [facility]. She will get in touch with them to schedule delivery.  Plan: Patient's case will remain closed. Will gladly reopen if the need arises.   Elayne Guerin, PharmD, [place] Clinical Pharmacist [phone]

## 2019-05-05 NOTE — Telephone Encounter (Signed)
-----   Message from [facility] [place] sent at 05/05/2019  9:33 AM EDT ----- Regarding: RE: Trulicty This has been sent in.  ----- Message ----- From: Elayne Guerin, [employer] Sent: 05/05/2019   8:55 AM EDT To: Aviva Signs, CMA Subject: RE: Delene Loll                                   It can be escribed. I added [facility] to her clinical demographics ----- Message ----- From: Johney Frame, Adelina Mings, CMA Sent: 05/05/2019   8:23 AM EDT To: Elayne Guerin, RPH Subject: RE: Trulicty                                   Can this be escribed or do they need a paper rx faxed?  ----- Message ----- From: Elayne Guerin, [facility] Sent: 04/30/2019   9:04 AM EDT To: Aviva Signs, CMA Subject: Leandra Kern morning! My name is Denyse Amass and I am a [facility] Clinical Pharmacist.  We helped Ms. Duren get approved through [employer] to receive Trulicity at no cost.  Since the beginning of the year, [facility] has changed their process.  A new prescription needs to be sent to [facility] as they are the new dispensing pharmacy for the [facility].    Can you send a new prescription to them for the patient?  It will be delivered to her home.    Blessings,   Elayne Guerin, PharmD, [place] Clinical Pharmacist [phone]

## 2019-05-09 ENCOUNTER — Ambulatory Visit: Payer: Self-pay | Admitting: Pharmacist

## 2019-05-21 ENCOUNTER — Other Ambulatory Visit: Payer: Self-pay | Admitting: Pharmacist

## 2019-05-21 NOTE — Patient Outreach (Signed)
[name]  05/21/2019  Sydney Avila Jan 14, 1946 [id number]   Officially removed myself from patient's care team as her [facility] case is closed. Patient received Trulicity from [employer] at no cost.   Elayne Guerin, PharmD, [place] Clinical Pharmacist [phone]

## 2019-05-27 ENCOUNTER — Other Ambulatory Visit: Payer: Self-pay | Admitting: Pharmacist

## 2019-05-28 NOTE — Patient Outreach (Signed)
[facility]) Care Management  05/28/2019  Sydney Avila 01-May-1946 [id number]   Patient called and left a voice mail wanting to know if I had another phone number for Rx Outreach. Patient's phone call was returned. HIPAA identifiers were obtained.    Patient explained that she received communication from [employer] that she could set her reorders up on "automatic refill".  She said she attempted to call both [employer] and [employer] and had very long hold times.  It was explained to the patient that we have very long hold times as well. It was suggested that she call after 5pm as both [place] are open until 6pm.   Patient also wondered about cholestyramine.  She said the pharmacist told her she would have to try something else as it was no longer available.    Cholestyramine appears to have a shortage due to [employer] and "divesting". Welchol is also a Bile Acid Sequestrant. It is not formulary for [facility]. Patient said she tried it before and it did not work for her.    Plan: Research the cholestyramine shortage and available options and call the patient back.   Elayne Guerin, PharmD, [place] Clinical Pharmacist [phone]

## 2019-05-29 LAB — HM DIABETES EYE EXAM

## 2019-06-03 ENCOUNTER — Encounter: Payer: Self-pay | Admitting: Internal Medicine

## 2019-06-03 ENCOUNTER — Other Ambulatory Visit: Payer: Self-pay | Admitting: Pharmacist

## 2019-06-03 NOTE — Patient Outreach (Signed)
[place] [facility]) Care Management  06/03/2019  Sydney Avila 1946-08-18 [id number]   Called patient to follow up on medication assistance with Trulicity. Patient said she has not been able to reach [employer].   Called [facility] with the patient on conference call.  Went through a back door number and was able to get the patient set up on automatic refill.  We also discussed Colestipol as an alternate Bile Acid Sequestrant.  Patient was encouraged to speak with her provider about alternatives to cholestyramine since it has been having shortage issues.  Follow up with patient in 6-8 weeks. Consider closing case.  Elayne Guerin, PharmD, [place] Clinical Pharmacist [phone]

## 2019-06-20 ENCOUNTER — Other Ambulatory Visit: Payer: Self-pay

## 2019-06-20 ENCOUNTER — Ambulatory Visit (INDEPENDENT_AMBULATORY_CARE_PROVIDER_SITE_OTHER): Payer: PPO

## 2019-06-20 DIAGNOSIS — Z23 Encounter for immunization: Secondary | ICD-10-CM | POA: Diagnosis not present

## 2019-06-23 DIAGNOSIS — Z961 Presence of intraocular lens: Secondary | ICD-10-CM | POA: Insufficient documentation

## 2019-06-23 DIAGNOSIS — E119 Type 2 diabetes mellitus without complications: Secondary | ICD-10-CM | POA: Diagnosis not present

## 2019-06-23 DIAGNOSIS — H524 Presbyopia: Secondary | ICD-10-CM | POA: Diagnosis not present

## 2019-06-23 DIAGNOSIS — Z794 Long term (current) use of insulin: Secondary | ICD-10-CM | POA: Diagnosis not present

## 2019-07-17 ENCOUNTER — Other Ambulatory Visit: Payer: Self-pay | Admitting: Internal Medicine

## 2019-07-17 ENCOUNTER — Telehealth: Payer: Self-pay

## 2019-07-17 ENCOUNTER — Other Ambulatory Visit: Payer: Self-pay | Admitting: Pharmacist

## 2019-07-17 NOTE — Telephone Encounter (Signed)
>>   Jul 17, 2019  1:19 PM Sydney Avila, CMA wrote:   Called pt and she stated that Dr. Ronnald Ramp increase statin to 20 mg. Pt states that the dose has her up and down all night and keeping her from staying asleep. Asked pt if she wanted something to help her stay asleep, she stated that she would rather not add another medication.   Pt would like to go back to the 10 mg?  Pt would also like to know if she should take fish oil?   Colestipol Hcl oral tablet 1 gram is requested to replace the powder since the powder is no longer available.   Copied from [place] [id number]. Topic: General - Other >> Jul 17, 2019 11:21 AM Sydney Avila wrote:  Reason for CRM: Pt stated she needs Sydney Avila to return her call. Cb# [phone]

## 2019-07-17 NOTE — Patient Outreach (Signed)
[facility]) Care Management  07/17/2019  KIMBERELY ARNET 05-14-1946 [id number]   Patient was called to follow up on Trulicity patient assistance. HIPAA identifiers were obtained. Patient confirmed she is supposed to receive Trulicity from [employer] 07/22/2019.    We had been investigating Cholestyramine powder alternatives because it is no longer being manufactured.  She said her provider may start her on Colestipol tablets but she would like to be given sample. Patient said next year Colestipol will be a tier 2 medication.   HgA1c 6.9% LDL -28 TRG-196 mg/dl  Patient reported her provider increased Simvastatin from 10 mg to 20 mg. Patient said the 20 mg tablets caused her to have trouble sleeping so she has gone back to the 10 mg dose.  She was advised to inform her provider about her side effect and decreasing the dose.    Plan:  Patient's case will remain closed. Her information has been sent to [employer] for patient assistance needs.  Elayne Guerin, PharmD, [place] Clinical Pharmacist [phone]

## 2019-07-20 ENCOUNTER — Other Ambulatory Visit: Payer: Self-pay | Admitting: Internal Medicine

## 2019-07-20 DIAGNOSIS — K9089 Other intestinal malabsorption: Secondary | ICD-10-CM

## 2019-07-20 DIAGNOSIS — E785 Hyperlipidemia, unspecified: Secondary | ICD-10-CM

## 2019-07-20 MED ORDER — SIMVASTATIN 10 MG PO TABS: 10.0000 mg | ORAL_TABLET | Freq: Every day | ORAL | 1 refills | Status: DC

## 2019-07-20 MED ORDER — COLESTIPOL HCL 1 G PO TABS: 4.0000 g | ORAL_TABLET | Freq: Two times a day (BID) | ORAL | 1 refills | Status: DC

## 2019-07-22 NOTE — Telephone Encounter (Signed)
Followed up on pt request.   Contacted pt and informed that the simvastatin 10 mg and the colestipol 1 gram tablet had been sent in.

## 2019-07-31 ENCOUNTER — Other Ambulatory Visit: Payer: Self-pay | Admitting: Internal Medicine

## 2019-08-19 ENCOUNTER — Telehealth: Payer: Self-pay | Admitting: Internal Medicine

## 2019-08-19 DIAGNOSIS — K9089 Other intestinal malabsorption: Secondary | ICD-10-CM

## 2019-08-19 NOTE — Telephone Encounter (Signed)
I will call pharmacy to confirm the form of the colestipol.

## 2019-08-19 NOTE — Telephone Encounter (Signed)
Patient requesting a new script for powder not tablet due to tablets being big colestipol (COLESTID) , please advise patient when new Rx is sent  [place] [address], [place]

## 2019-08-21 NOTE — Telephone Encounter (Signed)
Called [facility] they stated that they have it in the packet and in a can. Pharmacist stated that he has the packet.

## 2019-08-26 MED ORDER — CHOLESTYRAMINE 4 G PO PACK: 4.0000 g | PACK | Freq: Three times a day (TID) | ORAL | 3 refills | Status: DC

## 2019-08-27 ENCOUNTER — Encounter: Payer: Self-pay | Admitting: Internal Medicine

## 2019-09-22 ENCOUNTER — Other Ambulatory Visit: Payer: Self-pay | Admitting: Internal Medicine

## 2019-09-22 DIAGNOSIS — E118 Type 2 diabetes mellitus with unspecified complications: Secondary | ICD-10-CM

## 2019-09-22 NOTE — Telephone Encounter (Signed)
Pt called and is needing a new prescription and form filled out for her medication trulicity. Pt is requesting to have this sent over for 6 months if possible. Pt is requesting to be called back when this has been done. Please advise.  Fax # [phone]    [facility] [place], [place] - [address]  [address] [place] [address]  Phone: [phone] Fax: [phone]  Not a 24 hour pharmacy; exact hours not known.

## 2019-09-23 ENCOUNTER — Other Ambulatory Visit: Payer: Self-pay | Admitting: Internal Medicine

## 2019-09-23 NOTE — Telephone Encounter (Signed)
Okay to send in refill of Trulicity to [facility]?

## 2019-09-24 NOTE — Telephone Encounter (Signed)
She is due for a 54-month A1c.  She will have to be seen for this.  TJ

## 2019-09-24 NOTE — Telephone Encounter (Signed)
Pt called back and was advised to schedule an appointment.

## 2019-09-24 NOTE — Telephone Encounter (Signed)
Pt is scheduled for 09/29/2019

## 2019-09-24 NOTE — Telephone Encounter (Signed)
LVM for pt to call back as soon as possible.   

## 2019-09-24 NOTE — Telephone Encounter (Addendum)
Per PCP pt is due for an appt to recheck A1c.   Per last office visit note, PCP wanted to see pt in November to recheck.

## 2019-09-29 ENCOUNTER — Other Ambulatory Visit: Payer: Self-pay

## 2019-09-29 ENCOUNTER — Ambulatory Visit (INDEPENDENT_AMBULATORY_CARE_PROVIDER_SITE_OTHER): Payer: PPO | Admitting: Internal Medicine

## 2019-09-29 ENCOUNTER — Encounter: Payer: Self-pay | Admitting: Internal Medicine

## 2019-09-29 VITALS — BP 144/78 | HR 86 | Temp 97.8°F | Ht 61.75 in | Wt 163.2 lb

## 2019-09-29 DIAGNOSIS — E785 Hyperlipidemia, unspecified: Secondary | ICD-10-CM

## 2019-09-29 DIAGNOSIS — E118 Type 2 diabetes mellitus with unspecified complications: Secondary | ICD-10-CM | POA: Diagnosis not present

## 2019-09-29 DIAGNOSIS — I10 Essential (primary) hypertension: Secondary | ICD-10-CM | POA: Diagnosis not present

## 2019-09-29 LAB — LIPID PANEL
Cholesterol: 140 mg/dL (ref 0–200)
HDL: 45.2 mg/dL (ref >39.00)
NonHDL: 94.31
Total CHOL/HDL Ratio: 3
Triglycerides: 205 mg/dL — ABNORMAL HIGH (ref 0.0–149.0)
VLDL: 41 mg/dL — ABNORMAL HIGH (ref 0.0–40.0)

## 2019-09-29 LAB — BASIC METABOLIC PANEL
BUN: 13 mg/dL (ref 6–23)
CO2: 26 mEq/L (ref 19–32)
Calcium: 9.4 mg/dL (ref 8.4–10.5)
Chloride: 104 mEq/L (ref 96–112)
Creatinine, Ser: 0.82 mg/dL (ref 0.40–1.20)
GFR: 67.81 mL/min (ref >60.00)
Glucose, Bld: 118 mg/dL — ABNORMAL HIGH (ref 70–99)
Potassium: 3.9 mEq/L (ref 3.5–5.1)
Sodium: 139 mEq/L (ref 135–145)

## 2019-09-29 LAB — HEMOGLOBIN A1C: Hgb A1c MFr Bld: 7.5 % — ABNORMAL HIGH (ref 4.6–6.5)

## 2019-09-29 LAB — LDL CHOLESTEROL, DIRECT: Direct LDL: 63 mg/dL

## 2019-09-29 NOTE — Progress Notes (Signed)
Subjective:  Patient ID: Sydney Avila, female    DOB: 1945/11/17  Age: 73 y.o. MRN: [id number]  CC: Hypertension and Diabetes  This visit occurred during the SARS-CoV-2 public health emergency.  Safety protocols were in place, including screening questions prior to the visit, additional usage of staff PPE, and extensive cleaning of exam room while observing appropriate contact time as indicated for disinfecting solutions.   HPI Sydney Avila presents for f/up - Other than weight gain she feels well today and offers no complaints.  She has been compliant with her medications that may be not so much with her lifestyle modifications.  Outpatient Medications Prior to Visit  Medication Sig Dispense Refill  . ALPRAZolam (XANAX) 0.5 MG tablet TAKE ONE TABLET BY MOUTH TWICE A DAY AS NEEDED FOR ANXIETY 45 tablet 3  . cholestyramine (QUESTRAN) 4 g packet Take 1 packet (4 g total) by mouth 3 (three) times daily with meals. 360 each 3  . Crisaborole (EUCRISA) 2 % OINT Apply 1 Act topically 2 (two) times a day. 100 g 3  . glucose blood (ONE TOUCH ULTRA TEST) test strip USE TO CHECK BLOOD SUGAR TWICE PER DAY 100 each 2  . levocetirizine (XYZAL) 5 MG tablet Take 1 tablet (5 mg total) by mouth every evening. 90 tablet 1  . metFORMIN (GLUCOPHAGE-XR) 500 MG 24 hr tablet Take 2 tablets (1,000 mg total) by mouth 2 (two) times daily with a meal. 360 tablet 1  . simvastatin (ZOCOR) 10 MG tablet Take 1 tablet (10 mg total) by mouth at bedtime. 90 tablet 1  . Dulaglutide (TRULICITY) A999333 0000000 SOPN Inject 1 Act into the skin once a week. 12 pen 3  . lisinopril (ZESTRIL) 10 MG tablet Take 1 tablet (10 mg total) by mouth daily. 90 tablet 1   No facility-administered medications prior to visit.    ROS Review of Systems  Constitutional: Positive for unexpected weight change (wt gain). Negative for diaphoresis and fatigue.  HENT: Negative.   Eyes: Negative.   Respiratory: Negative for cough, chest tightness,  shortness of breath and wheezing.   Cardiovascular: Negative for chest pain, palpitations and leg swelling.  Gastrointestinal: Negative for abdominal pain, constipation, diarrhea, nausea and vomiting.  Endocrine: Negative.  Negative for polyuria.  Genitourinary: Negative.  Negative for difficulty urinating.  Musculoskeletal: Negative for arthralgias and myalgias.  Skin: Negative.  Negative for color change.  Neurological: Negative.  Negative for dizziness, weakness and light-headedness.  Hematological: Negative for adenopathy. Does not bruise/bleed easily.  Psychiatric/Behavioral: Negative.     Objective:  BP (!) 144/78 (BP Location: Left Arm, Patient Position: Sitting, Cuff Size: Normal)   Pulse 86   Temp 97.8 F (36.6 C) (Oral)   Ht 5' 1.75" (1.568 m)   Wt 163 lb 4 oz (74 kg)   SpO2 98%   BMI 30.10 kg/m   BP Readings from Last 3 Encounters:  09/29/19 (!) 144/78  04/23/19 (!) 146/76  01/30/18 130/72    Wt Readings from Last 3 Encounters:  09/29/19 163 lb 4 oz (74 kg)  04/23/19 159 lb (72.1 kg)  01/30/18 163 lb (73.9 kg)    Physical Exam Vitals reviewed.  Constitutional:      Appearance: Normal appearance.  HENT:     Nose: Nose normal.     Mouth/Throat:     Mouth: Mucous membranes are moist.  Eyes:     General: No scleral icterus.    Conjunctiva/sclera: Conjunctivae normal.  Cardiovascular:  Rate and Rhythm: Normal rate and regular rhythm.     Heart sounds: No murmur.  Pulmonary:     Effort: Pulmonary effort is normal.     Breath sounds: No stridor. No wheezing, rhonchi or rales.  Abdominal:     General: Abdomen is flat. Bowel sounds are normal. There is no distension.     Palpations: Abdomen is soft. There is no hepatomegaly or splenomegaly.     Tenderness: There is no abdominal tenderness.  Musculoskeletal:        General: Normal range of motion.     Cervical back: Neck supple.     Right lower leg: No edema.     Left lower leg: No edema.    Lymphadenopathy:     Cervical: No cervical adenopathy.  Skin:    General: Skin is warm and dry.  Neurological:     General: No focal deficit present.     Mental Status: She is alert.  Psychiatric:        Mood and Affect: Mood normal.        Behavior: Behavior normal.     Lab Results  Component Value Date   WBC 6.9 04/23/2019   HGB 13.6 04/23/2019   HCT 41.4 04/23/2019   PLT 222.0 04/23/2019   GLUCOSE 118 (H) 09/29/2019   CHOL 140 09/29/2019   TRIG 205.0 (H) 09/29/2019   HDL 45.20 09/29/2019   LDLDIRECT 63.0 09/29/2019   LDLCALC 28 04/23/2019   ALT 14 04/23/2019   AST 15 04/23/2019   NA 139 09/29/2019   K 3.9 09/29/2019   CL 104 09/29/2019   CREATININE 0.82 09/29/2019   BUN 13 09/29/2019   CO2 26 09/29/2019   TSH 3.34 04/23/2019   HGBA1C 7.5 (H) 09/29/2019   MICROALBUR <0.7 04/23/2019    MM 3D SCREEN BREAST BILATERAL  Result Date: 09/11/2018 CLINICAL DATA:  Screening. EXAM: DIGITAL SCREENING BILATERAL MAMMOGRAM WITH TOMO AND CAD COMPARISON:  Previous exam(s). ACR Breast Density Category c: The breast tissue is heterogeneously dense, which may obscure small masses. FINDINGS: There are no findings suspicious for malignancy. Images were processed with CAD. IMPRESSION: No mammographic evidence of malignancy. A result letter of this screening mammogram will be mailed directly to the patient. RECOMMENDATION: Screening mammogram in one year. (Code:SM-B-01Y) BI-RADS CATEGORY  1: Negative. Electronically Signed   By: Everlean Alstrom M.D.   On: 09/11/2018 13:26    Assessment & Plan:   Sydney Avila was seen today for hypertension and diabetes.  Diagnoses and all orders for this visit:  Essential hypertension, benign- Her blood pressure is not at goal of 130/80.  I recommended that she continue the current medications and to improve her lifestyle modifications. -     Basic metabolic panel -     lisinopril (ZESTRIL) 10 MG tablet; Take 1 tablet (10 mg total) by mouth daily.  Type II  diabetes mellitus with manifestations ([place])- Her A1c is higher than it was before at 7.5%.  I recommended that she continue the current medications and to improve her lifestyle modifications. -     Basic metabolic panel -     Hemoglobin A1c -     Dulaglutide (TRULICITY) A999333 0000000 SOPN; Inject 1 Act into the skin once a week. -     lisinopril (ZESTRIL) 10 MG tablet; Take 1 tablet (10 mg total) by mouth daily.  Hyperlipidemia with target LDL less than 100- She has achieved her LDL goal is doing well on the statin. -  Lipid panel  Type 2 diabetes mellitus with complication, without long-term current use of insulin (HCC) -     Dulaglutide (TRULICITY) A999333 0000000 SOPN; Inject 1 Act into the skin once a week. -     lisinopril (ZESTRIL) 10 MG tablet; Take 1 tablet (10 mg total) by mouth daily.  Other orders -     LDL cholesterol, direct   I am having Sydney Avila "Collie Siad" maintain her glucose blood, Eucrisa, ALPRAZolam, levocetirizine, metFORMIN, simvastatin, cholestyramine, Trulicity, and lisinopril.  Meds ordered this encounter  Medications  . Dulaglutide (TRULICITY) A999333 0000000 SOPN    Sig: Inject 1 Act into the skin once a week.    Dispense:  12 pen    Refill:  1  . lisinopril (ZESTRIL) 10 MG tablet    Sig: Take 1 tablet (10 mg total) by mouth daily.    Dispense:  90 tablet    Refill:  1     Follow-up: Return in about 6 months (around 03/29/2020).  [name], MD

## 2019-09-29 NOTE — Patient Instructions (Signed)
Type 2 Diabetes Mellitus, Diagnosis, Adult Type 2 diabetes (type 2 diabetes mellitus) is a long-term (chronic) disease. In type 2 diabetes, one or both of these problems may be present:  The pancreas does not make enough of a hormone called insulin.  Cells in the body do not respond properly to insulin that the body makes (insulin resistance). Normally, insulin allows blood sugar (glucose) to enter cells in the body. The cells use glucose for energy. Insulin resistance or lack of insulin causes excess glucose to build up in the blood instead of going into cells. As a result, high blood glucose (hyperglycemia) develops. What increases the risk? The following factors may make you more likely to develop type 2 diabetes:  Having a family member with type 2 diabetes.  Being overweight or obese.  Having an inactive (sedentary) lifestyle.  Having been diagnosed with insulin resistance.  Having a history of prediabetes, gestational diabetes, or polycystic ovary syndrome (PCOS).  Being of American-Indian, African-American, Hispanic/Latino, or Asian/Pacific Islander descent. What are the signs or symptoms? In the early stage of this condition, you may not have symptoms. Symptoms develop slowly and may include:  Increased thirst (polydipsia).  Increased hunger(polyphagia).  Increased urination (polyuria).  Increased urination during the night (nocturia).  Unexplained weight loss.  Frequent infections that keep coming back (recurring).  Fatigue.  Weakness.  Vision changes, such as blurry vision.  Cuts or bruises that are slow to heal.  Tingling or numbness in the hands or feet.  Dark patches on the skin (acanthosis nigricans). How is this diagnosed? This condition is diagnosed based on your symptoms, your medical history, a physical exam, and your blood glucose level. Your blood glucose may be checked with one or more of the following blood tests:  A fasting blood glucose (FBG)  test. You will not be allowed to eat (you will fast) for 8 hours or longer before a blood sample is taken.  A random blood glucose test. This test checks blood glucose at any time of day regardless of when you ate.  An A1c (hemoglobin A1c) blood test. This test provides information about blood glucose control over the previous 2-3 months.  An oral glucose tolerance test (OGTT). This test measures your blood glucose at two times: ? After fasting. This is your baseline blood glucose level. ? Two hours after drinking a beverage that contains glucose. You may be diagnosed with type 2 diabetes if:  Your FBG level is 126 mg/dL (7.0 mmol/L) or higher.  Your random blood glucose level is 200 mg/dL (11.1 mmol/L) or higher.  Your A1c level is 6.5% or higher.  Your OGTT result is higher than 200 mg/dL (11.1 mmol/L). These blood tests may be repeated to confirm your diagnosis. How is this treated? Your treatment may be managed by a specialist called an endocrinologist. Type 2 diabetes may be treated by following instructions from your health care provider about:  Making diet and lifestyle changes. This may include: ? Following an individualized nutrition plan that is developed by a diet and nutrition specialist (registered dietitian). ? Exercising regularly. ? Finding ways to manage stress.  Checking your blood glucose level as often as told.  Taking diabetes medicines or insulin daily. This helps to keep your blood glucose levels in the healthy range. ? If you use insulin, you may need to adjust the dosage depending on how physically active you are and what foods you eat. Your health care provider will tell you how to adjust your dosage.    Taking medicines to help prevent complications from diabetes, such as: ? Aspirin. ? Medicine to lower cholesterol. ? Medicine to control blood pressure. Your health care provider will set individualized treatment goals for you. Your goals will be based on  your age, other medical conditions you have, and how you respond to diabetes treatment. Generally, the goal of treatment is to maintain the following blood glucose levels:  Before meals (preprandial): 80-130 mg/dL (4.4-7.2 mmol/L).  After meals (postprandial): below 180 mg/dL (10 mmol/L).  A1c level: less than 7%. Follow these instructions at home: Questions to ask your health care provider  Consider asking the following questions: ? Do I need to meet with a diabetes educator? ? Where can I find a support group for people with diabetes? ? What equipment will I need to manage my diabetes at home? ? What diabetes medicines do I need, and when should I take them? ? How often do I need to check my blood glucose? ? What number can I call if I have questions? ? When is my next appointment? General instructions  Take over-the-counter and prescription medicines only as told by your health care provider.  Keep all follow-up visits as told by your health care provider. This is important.  For more information about diabetes, visit: ? American Diabetes Association (ADA): www.diabetes.org ? American Association of Diabetes Educators (AADE): www.diabeteseducator.org Contact a health care provider if:  Your blood glucose is at or above 240 mg/dL (13.3 mmol/L) for 2 days in a row.  You have been sick or have had a fever for 2 days or longer, and you are not getting better.  You have any of the following problems for more than 6 hours: ? You cannot eat or drink. ? You have nausea and vomiting. ? You have diarrhea. Get help right away if:  Your blood glucose is lower than 54 mg/dL (3.0 mmol/L).  You become confused or you have trouble thinking clearly.  You have difficulty breathing.  You have moderate or large ketone levels in your urine. Summary  Type 2 diabetes (type 2 diabetes mellitus) is a long-term (chronic) disease. In type 2 diabetes, the pancreas does not make enough of a  hormone called insulin, or cells in the body do not respond properly to insulin that the body makes (insulin resistance).  This condition is treated by making diet and lifestyle changes and taking diabetes medicines or insulin.  Your health care provider will set individualized treatment goals for you. Your goals will be based on your age, other medical conditions you have, and how you respond to diabetes treatment.  Keep all follow-up visits as told by your health care provider. This is important. This information is not intended to replace advice given to you by your health care provider. Make sure you discuss any questions you have with your health care provider. Document Released: 09/25/2005 Document Revised: 11/23/2017 Document Reviewed: 10/29/2015 Elsevier Patient Education  2020 Elsevier Inc.  

## 2019-09-30 ENCOUNTER — Encounter: Payer: Self-pay | Admitting: Internal Medicine

## 2019-09-30 MED ORDER — LISINOPRIL 10 MG PO TABS: 10.0000 mg | ORAL_TABLET | Freq: Every day | ORAL | 1 refills | Status: DC

## 2019-09-30 MED ORDER — TRULICITY 0.75 MG/0.5ML ~~LOC~~ SOAJ: 1.0000 | SUBCUTANEOUS | 1 refills | Status: DC

## 2019-10-07 NOTE — Telephone Encounter (Signed)
When you come in to pick up, let the front staff know you are picking up your patient assistance located in the Nurse Visit Room (top shelf of frig).

## 2019-10-08 ENCOUNTER — Other Ambulatory Visit: Payer: Self-pay | Admitting: Internal Medicine

## 2019-10-08 DIAGNOSIS — F411 Generalized anxiety disorder: Secondary | ICD-10-CM

## 2019-10-13 ENCOUNTER — Ambulatory Visit: Payer: PPO | Attending: Internal Medicine

## 2019-10-13 DIAGNOSIS — Z20822 Contact with and (suspected) exposure to covid-19: Secondary | ICD-10-CM

## 2019-10-14 ENCOUNTER — Other Ambulatory Visit: Payer: Self-pay | Admitting: Pharmacist

## 2019-10-14 ENCOUNTER — Encounter: Payer: Self-pay | Admitting: Internal Medicine

## 2019-10-14 LAB — NOVEL CORONAVIRUS, NAA: SARS-CoV-2, NAA: NOT DETECTED

## 2019-10-14 NOTE — Patient Outreach (Signed)
[name] Care Management  10/14/2019  Sydney Avila 1946-06-19 [id number]   Patient called to inquire about the new patient assistance process. HIPAA identifiers were obtained. Patient said someone from [employer] called her about her patient assistance paperwork and she was not happy about the new process. She reported being sent completely blank forms and being told to coordinate the completion of the forms with her provider on her own.   She reported being able to comply with the process but she wanted to let us know that she preferred the [facility] medication assistance process.  Patient was instructed to call [employer] customer service. Some brief questions were answered about Medicare and the donut hole for 2021.  Plan: Patient's case will remain closed.  Elayne Guerin, PharmD, [place] Clinical Pharmacist [phone]

## 2019-10-22 DIAGNOSIS — L570 Actinic keratosis: Secondary | ICD-10-CM | POA: Diagnosis not present

## 2019-10-22 DIAGNOSIS — L820 Inflamed seborrheic keratosis: Secondary | ICD-10-CM | POA: Diagnosis not present

## 2019-10-22 DIAGNOSIS — L821 Other seborrheic keratosis: Secondary | ICD-10-CM | POA: Diagnosis not present

## 2019-10-22 DIAGNOSIS — D1801 Hemangioma of skin and subcutaneous tissue: Secondary | ICD-10-CM | POA: Diagnosis not present

## 2019-10-22 DIAGNOSIS — L814 Other melanin hyperpigmentation: Secondary | ICD-10-CM | POA: Diagnosis not present

## 2019-10-25 ENCOUNTER — Ambulatory Visit: Payer: Medicare Other | Attending: Internal Medicine

## 2019-10-25 DIAGNOSIS — Z23 Encounter for immunization: Secondary | ICD-10-CM

## 2019-10-25 NOTE — Progress Notes (Signed)
   Covid-19 Vaccination Clinic  Name:  Sydney Avila    MRN: [id number] DOB: 1945-11-28  10/25/2019  [name] was observed post Covid-19 immunization for 15 minutes without incidence. She was provided with Vaccine Information Sheet and instruction to access the V-Safe system.   [name] was instructed to call 911 with any severe reactions post vaccine: Marland Kitchen Difficulty breathing  . Swelling of your face and throat  . A fast heartbeat  . A bad rash all over your body  . Dizziness and weakness    Immunizations Administered    Name Date Dose VIS Date Route   Pfizer COVID-19 Vaccine 10/25/2019  1:56 PM 0.3 mL 09/19/2019 Intramuscular   Manufacturer: [employer]   Lot: [id number]   [place]: [id number]

## 2019-10-29 DIAGNOSIS — M21962 Unspecified acquired deformity of left lower leg: Secondary | ICD-10-CM | POA: Diagnosis not present

## 2019-10-29 DIAGNOSIS — M21622 Bunionette of left foot: Secondary | ICD-10-CM | POA: Diagnosis not present

## 2019-10-29 DIAGNOSIS — E1351 Other specified diabetes mellitus with diabetic peripheral angiopathy without gangrene: Secondary | ICD-10-CM | POA: Diagnosis not present

## 2019-10-29 DIAGNOSIS — D2372 Other benign neoplasm of skin of left lower limb, including hip: Secondary | ICD-10-CM | POA: Diagnosis not present

## 2019-11-13 ENCOUNTER — Ambulatory Visit: Payer: Medicare Other | Attending: Internal Medicine

## 2019-11-13 DIAGNOSIS — Z23 Encounter for immunization: Secondary | ICD-10-CM | POA: Insufficient documentation

## 2019-11-13 NOTE — Progress Notes (Signed)
   Covid-19 Vaccination Clinic  Name:  Sydney Avila    MRN: [id number] DOB: 1946/04/21  11/13/2019  [name] was observed post Covid-19 immunization for 15 minutes without incidence. She was provided with Vaccine Information Sheet and instruction to access the V-Safe system.   [name] was instructed to call 911 with any severe reactions post vaccine: Marland Kitchen Difficulty breathing  . Swelling of your face and throat  . A fast heartbeat  . A bad rash all over your body  . Dizziness and weakness    Immunizations Administered    Name Date Dose VIS Date Route   Pfizer COVID-19 Vaccine 11/13/2019  2:41 PM 0.3 mL 09/19/2019 Intramuscular   Manufacturer: [place]   Lot: [id number]   [place]: [id number]

## 2019-11-27 LAB — HM MAMMOGRAPHY: HM Mammogram: NORMAL (ref 0–4)

## 2019-11-27 LAB — HM DIABETES EYE EXAM

## 2019-12-04 ENCOUNTER — Encounter: Payer: Self-pay | Admitting: Internal Medicine

## 2019-12-04 MED ORDER — FLUCONAZOLE 150 MG PO TABS: 150.0000 mg | ORAL_TABLET | Freq: Once | ORAL | 0 refills | Status: AC

## 2019-12-04 NOTE — Telephone Encounter (Signed)
Pt rx'ed abx by dentist and has a vaginal yeast infection.   Okay to sent in diflucan?   Please advise in PCP absence.

## 2020-01-06 ENCOUNTER — Encounter: Payer: Self-pay | Admitting: Internal Medicine

## 2020-01-09 ENCOUNTER — Other Ambulatory Visit: Payer: Self-pay | Admitting: Internal Medicine

## 2020-01-09 DIAGNOSIS — E118 Type 2 diabetes mellitus with unspecified complications: Secondary | ICD-10-CM

## 2020-01-09 DIAGNOSIS — I10 Essential (primary) hypertension: Secondary | ICD-10-CM

## 2020-01-09 DIAGNOSIS — E785 Hyperlipidemia, unspecified: Secondary | ICD-10-CM

## 2020-02-18 ENCOUNTER — Other Ambulatory Visit: Payer: Self-pay

## 2020-02-18 MED ORDER — ONETOUCH DELICA PLUS LANCET33G MISC: 3 refills | Status: AC

## 2020-02-24 DIAGNOSIS — L84 Corns and callosities: Secondary | ICD-10-CM | POA: Diagnosis not present

## 2020-02-24 DIAGNOSIS — M21622 Bunionette of left foot: Secondary | ICD-10-CM | POA: Diagnosis not present

## 2020-02-24 DIAGNOSIS — E1351 Other specified diabetes mellitus with diabetic peripheral angiopathy without gangrene: Secondary | ICD-10-CM | POA: Diagnosis not present

## 2020-02-24 DIAGNOSIS — D2372 Other benign neoplasm of skin of left lower limb, including hip: Secondary | ICD-10-CM | POA: Diagnosis not present

## 2020-02-24 DIAGNOSIS — M21962 Unspecified acquired deformity of left lower leg: Secondary | ICD-10-CM | POA: Diagnosis not present

## 2020-03-28 ENCOUNTER — Other Ambulatory Visit: Payer: Self-pay | Admitting: Internal Medicine

## 2020-03-28 DIAGNOSIS — F411 Generalized anxiety disorder: Secondary | ICD-10-CM

## 2020-04-08 ENCOUNTER — Encounter: Payer: Self-pay | Admitting: Internal Medicine

## 2020-04-08 ENCOUNTER — Other Ambulatory Visit: Payer: Self-pay

## 2020-04-08 ENCOUNTER — Ambulatory Visit (INDEPENDENT_AMBULATORY_CARE_PROVIDER_SITE_OTHER): Payer: PPO | Admitting: Internal Medicine

## 2020-04-08 ENCOUNTER — Other Ambulatory Visit: Payer: Self-pay | Admitting: Internal Medicine

## 2020-04-08 VITALS — BP 160/84 | HR 93 | Temp 98.2°F | Resp 16 | Ht 61.75 in | Wt 162.0 lb

## 2020-04-08 DIAGNOSIS — E785 Hyperlipidemia, unspecified: Secondary | ICD-10-CM

## 2020-04-08 DIAGNOSIS — E118 Type 2 diabetes mellitus with unspecified complications: Secondary | ICD-10-CM

## 2020-04-08 DIAGNOSIS — I10 Essential (primary) hypertension: Secondary | ICD-10-CM

## 2020-04-08 LAB — POCT GLYCOSYLATED HEMOGLOBIN (HGB A1C): Hemoglobin A1C: 6.8 % — AB (ref 4.0–5.6)

## 2020-04-08 MED ORDER — METFORMIN HCL ER 500 MG PO TB24: ORAL_TABLET | ORAL | 1 refills | Status: DC

## 2020-04-08 NOTE — Patient Instructions (Signed)
Type 2 Diabetes Mellitus, Diagnosis, Adult Type 2 diabetes (type 2 diabetes mellitus) is a long-term (chronic) disease. In type 2 diabetes, one or both of these problems may be present:  The pancreas does not make enough of a hormone called insulin.  Cells in the body do not respond properly to insulin that the body makes (insulin resistance). Normally, insulin allows blood sugar (glucose) to enter cells in the body. The cells use glucose for energy. Insulin resistance or lack of insulin causes excess glucose to build up in the blood instead of going into cells. As a result, high blood glucose (hyperglycemia) develops. What increases the risk? The following factors may make you more likely to develop type 2 diabetes:  Having a family member with type 2 diabetes.  Being overweight or obese.  Having an inactive (sedentary) lifestyle.  Having been diagnosed with insulin resistance.  Having a history of prediabetes, gestational diabetes, or polycystic ovary syndrome (PCOS).  Being of American-Indian, African-American, Hispanic/Latino, or Asian/Pacific Islander descent. What are the signs or symptoms? In the early stage of this condition, you may not have symptoms. Symptoms develop slowly and may include:  Increased thirst (polydipsia).  Increased hunger(polyphagia).  Increased urination (polyuria).  Increased urination during the night (nocturia).  Unexplained weight loss.  Frequent infections that keep coming back (recurring).  Fatigue.  Weakness.  Vision changes, such as blurry vision.  Cuts or bruises that are slow to heal.  Tingling or numbness in the hands or feet.  Dark patches on the skin (acanthosis nigricans). How is this diagnosed? This condition is diagnosed based on your symptoms, your medical history, a physical exam, and your blood glucose level. Your blood glucose may be checked with one or more of the following blood tests:  A fasting blood glucose (FBG)  test. You will not be allowed to eat (you will fast) for 8 hours or longer before a blood sample is taken.  A random blood glucose test. This test checks blood glucose at any time of day regardless of when you ate.  An A1c (hemoglobin A1c) blood test. This test provides information about blood glucose control over the previous 2-3 months.  An oral glucose tolerance test (OGTT). This test measures your blood glucose at two times: ? After fasting. This is your baseline blood glucose level. ? Two hours after drinking a beverage that contains glucose. You may be diagnosed with type 2 diabetes if:  Your FBG level is 126 mg/dL (7.0 mmol/L) or higher.  Your random blood glucose level is 200 mg/dL (11.1 mmol/L) or higher.  Your A1c level is 6.5% or higher.  Your OGTT result is higher than 200 mg/dL (11.1 mmol/L). These blood tests may be repeated to confirm your diagnosis. How is this treated? Your treatment may be managed by a specialist called an endocrinologist. Type 2 diabetes may be treated by following instructions from your health care provider about:  Making diet and lifestyle changes. This may include: ? Following an individualized nutrition plan that is developed by a diet and nutrition specialist (registered dietitian). ? Exercising regularly. ? Finding ways to manage stress.  Checking your blood glucose level as often as told.  Taking diabetes medicines or insulin daily. This helps to keep your blood glucose levels in the healthy range. ? If you use insulin, you may need to adjust the dosage depending on how physically active you are and what foods you eat. Your health care provider will tell you how to adjust your dosage.    Taking medicines to help prevent complications from diabetes, such as: ? Aspirin. ? Medicine to lower cholesterol. ? Medicine to control blood pressure. Your health care provider will set individualized treatment goals for you. Your goals will be based on  your age, other medical conditions you have, and how you respond to diabetes treatment. Generally, the goal of treatment is to maintain the following blood glucose levels:  Before meals (preprandial): 80-130 mg/dL (4.4-7.2 mmol/L).  After meals (postprandial): below 180 mg/dL (10 mmol/L).  A1c level: less than 7%. Follow these instructions at home: Questions to ask your health care provider  Consider asking the following questions: ? Do I need to meet with a diabetes educator? ? Where can I find a support group for people with diabetes? ? What equipment will I need to manage my diabetes at home? ? What diabetes medicines do I need, and when should I take them? ? How often do I need to check my blood glucose? ? What number can I call if I have questions? ? When is my next appointment? General instructions  Take over-the-counter and prescription medicines only as told by your health care provider.  Keep all follow-up visits as told by your health care provider. This is important.  For more information about diabetes, visit: ? American Diabetes Association (ADA): www.diabetes.org ? American Association of Diabetes Educators (AADE): www.diabeteseducator.org Contact a health care provider if:  Your blood glucose is at or above 240 mg/dL (13.3 mmol/L) for 2 days in a row.  You have been sick or have had a fever for 2 days or longer, and you are not getting better.  You have any of the following problems for more than 6 hours: ? You cannot eat or drink. ? You have nausea and vomiting. ? You have diarrhea. Get help right away if:  Your blood glucose is lower than 54 mg/dL (3.0 mmol/L).  You become confused or you have trouble thinking clearly.  You have difficulty breathing.  You have moderate or large ketone levels in your urine. Summary  Type 2 diabetes (type 2 diabetes mellitus) is a long-term (chronic) disease. In type 2 diabetes, the pancreas does not make enough of a  hormone called insulin, or cells in the body do not respond properly to insulin that the body makes (insulin resistance).  This condition is treated by making diet and lifestyle changes and taking diabetes medicines or insulin.  Your health care provider will set individualized treatment goals for you. Your goals will be based on your age, other medical conditions you have, and how you respond to diabetes treatment.  Keep all follow-up visits as told by your health care provider. This is important. This information is not intended to replace advice given to you by your health care provider. Make sure you discuss any questions you have with your health care provider. Document Revised: 11/23/2017 Document Reviewed: 10/29/2015 Elsevier Patient Education  2020 Elsevier Inc.  

## 2020-04-08 NOTE — Progress Notes (Signed)
Subjective:  Patient ID: Sydney Avila, female    DOB: Jan 08, 1946  Age: 74 y.o. MRN: [id number]  CC: Hypertension and Diabetes  This visit occurred during the SARS-CoV-2 public health emergency.  Safety protocols were in place, including screening questions prior to the visit, additional usage of staff PPE, and extensive cleaning of exam room while observing appropriate contact time as indicated for disinfecting solutions.    HPI Sydney Avila presents for f/up - She has felt well recently and offers no complaints.  She is active and denies any recent episodes of chest pain, shortness of breath, palpitations, edema, or fatigue.  She tells me her blood sugars have been well controlled.  She does not monitor her blood pressure.  It looks like based on prescription refills she would have recently run out of the ACE inhibitor.  Outpatient Medications Prior to Visit  Medication Sig Dispense Refill  . ALPRAZolam (XANAX) 0.5 MG tablet TAKE ONE TABLET BY MOUTH TWICE A DAY AS NEEDED FOR ANXIETY 45 tablet 2  . cholestyramine (QUESTRAN) 4 g packet Take 1 packet (4 g total) by mouth 3 (three) times daily with meals. 360 each 3  . Crisaborole (EUCRISA) 2 % OINT Apply 1 Act topically 2 (two) times a day. 100 g 3  . Dulaglutide (TRULICITY) 4.09 WJ/1.9JY SOPN Inject 1 Act into the skin once a week. 12 pen 1  . Lancets (ONETOUCH DELICA PLUS [id number]) MISC Use to check blood sugars twice daily. DX: E11.9 200 each 3  . levocetirizine (XYZAL) 5 MG tablet Take 1 tablet (5 mg total) by mouth every evening. 90 tablet 1  . simvastatin (ZOCOR) 10 MG tablet TAKE ONE TABLET BY MOUTH EVERY NIGHT AT BEDTIME 90 tablet 0  . lisinopril (ZESTRIL) 10 MG tablet TAKE ONE TABLET BY MOUTH DAILY 68 tablet 0  . metFORMIN (GLUCOPHAGE-XR) 500 MG 24 hr tablet TAKE TWO TABLETS BY MOUTH TWICE A DAY WITH A MEAL 360 tablet 0   No facility-administered medications prior to visit.    ROS Review of Systems  Constitutional: Negative  for diaphoresis, fatigue and unexpected weight change.  Eyes: Negative for visual disturbance.  Respiratory: Negative for cough, chest tightness, shortness of breath and wheezing.   Cardiovascular: Negative for chest pain, palpitations and leg swelling.  Gastrointestinal: Negative for abdominal pain, constipation, diarrhea, nausea and vomiting.  Endocrine: Negative.   Genitourinary: Negative.  Negative for difficulty urinating.  Musculoskeletal: Negative for arthralgias and myalgias.  Skin: Negative.  Negative for color change and pallor.  Neurological: Negative.  Negative for dizziness, weakness, light-headedness and numbness.  Hematological: Negative for adenopathy. Does not bruise/bleed easily.  Psychiatric/Behavioral: Negative.     Objective:  BP (!) 160/84 (BP Location: Left Arm, Patient Position: Sitting, Cuff Size: Normal)   Pulse 93   Temp 98.2 F (36.8 C) (Oral)   Resp 16   Ht 5' 1.75" (1.568 m)   Wt 162 lb (73.5 kg)   SpO2 96%   BMI 29.87 kg/m   BP Readings from Last 3 Encounters:  04/08/20 (!) 160/84  09/29/19 (!) 144/78  04/23/19 (!) 146/76    Wt Readings from Last 3 Encounters:  04/08/20 162 lb (73.5 kg)  09/29/19 163 lb 4 oz (74 kg)  04/23/19 159 lb (72.1 kg)    Physical Exam Vitals reviewed.  Constitutional:      Appearance: Normal appearance.  HENT:     Nose: Nose normal.     Mouth/Throat:     Mouth: Mucous  membranes are moist.  Eyes:     General: No scleral icterus.    Conjunctiva/sclera: Conjunctivae normal.  Cardiovascular:     Rate and Rhythm: Normal rate and regular rhythm.     Heart sounds: No murmur heard.   Pulmonary:     Effort: Pulmonary effort is normal.     Breath sounds: No stridor. No wheezing, rhonchi or rales.  Abdominal:     General: Abdomen is flat.     Palpations: There is no mass.     Tenderness: There is no abdominal tenderness. There is no guarding.  Musculoskeletal:        General: Normal range of motion.      Cervical back: Neck supple.     Right lower leg: No edema.     Left lower leg: No edema.  Lymphadenopathy:     Cervical: No cervical adenopathy.  Skin:    General: Skin is warm and dry.     Coloration: Skin is not pale.  Neurological:     General: No focal deficit present.     Mental Status: She is alert.  Psychiatric:        Mood and Affect: Mood normal.        Behavior: Behavior normal.     Lab Results  Component Value Date   WBC 6.9 04/23/2019   HGB 13.6 04/23/2019   HCT 41.4 04/23/2019   PLT 222.0 04/23/2019   GLUCOSE 118 (H) 09/29/2019   CHOL 140 09/29/2019   TRIG 205.0 (H) 09/29/2019   HDL 45.20 09/29/2019   LDLDIRECT 63.0 09/29/2019   LDLCALC 28 04/23/2019   ALT 14 04/23/2019   AST 15 04/23/2019   NA 139 09/29/2019   K 3.9 09/29/2019   CL 104 09/29/2019   CREATININE 0.82 09/29/2019   BUN 13 09/29/2019   CO2 26 09/29/2019   TSH 3.34 04/23/2019   HGBA1C 6.8 (A) 04/08/2020   MICROALBUR <0.7 04/23/2019    MM 3D SCREEN BREAST BILATERAL  Result Date: 09/11/2018 CLINICAL DATA:  Screening. EXAM: DIGITAL SCREENING BILATERAL MAMMOGRAM WITH TOMO AND CAD COMPARISON:  Previous exam(s). ACR Breast Density Category c: The breast tissue is heterogeneously dense, which may obscure small masses. FINDINGS: There are no findings suspicious for malignancy. Images were processed with CAD. IMPRESSION: No mammographic evidence of malignancy. A result letter of this screening mammogram will be mailed directly to the patient. RECOMMENDATION: Screening mammogram in one year. (Code:SM-B-01Y) BI-RADS CATEGORY  1: Negative. Electronically Signed   By: Everlean Alstrom M.D.   On: 09/11/2018 13:26    Assessment & Plan:   Sydney Avila was seen today for hypertension and diabetes.  Diagnoses and all orders for this visit:  Type II diabetes mellitus with manifestations ([place])- Her A1c is at 6.8%.  Her blood sugars are adequately well controlled.  She wants to stay on the current regimen. -     POCT  glycosylated hemoglobin (Hb A1C) -     metFORMIN (GLUCOPHAGE-XR) 500 MG 24 hr tablet; TAKE TWO TABLETS BY MOUTH TWICE A DAY WITH A MEAL -     lisinopril (ZESTRIL) 20 MG tablet; Take 1 tablet (20 mg total) by mouth daily.  Essential hypertension, benign- Her blood pressure is not adequately well controlled.  I recommended that she restart the ACE inhibitor and at a higher low dose. -     lisinopril (ZESTRIL) 20 MG tablet; Take 1 tablet (20 mg total) by mouth daily.  Type 2 diabetes mellitus with complication, without long-term current  use of insulin ([facility]) -     metFORMIN (GLUCOPHAGE-XR) 500 MG 24 hr tablet; TAKE TWO TABLETS BY MOUTH TWICE A DAY WITH A MEAL -     lisinopril (ZESTRIL) 20 MG tablet; Take 1 tablet (20 mg total) by mouth daily.   I have discontinued Lemmie Evens. Alers "Sue"'s lisinopril. I am also having her start on lisinopril. Additionally, I am having her maintain her Eucrisa, levocetirizine, cholestyramine, Trulicity, simvastatin, OneTouch Delica Plus [id number], ALPRAZolam, and metFORMIN.  Meds ordered this encounter  Medications  . metFORMIN (GLUCOPHAGE-XR) 500 MG 24 hr tablet    Sig: TAKE TWO TABLETS BY MOUTH TWICE A DAY WITH A MEAL    Dispense:  360 tablet    Refill:  1  . lisinopril (ZESTRIL) 20 MG tablet    Sig: Take 1 tablet (20 mg total) by mouth daily.    Dispense:  90 tablet    Refill:  1     Follow-up: Return in about 6 months (around 10/09/2020).  [name], MD

## 2020-04-09 ENCOUNTER — Other Ambulatory Visit: Payer: Self-pay | Admitting: Internal Medicine

## 2020-04-09 DIAGNOSIS — E785 Hyperlipidemia, unspecified: Secondary | ICD-10-CM

## 2020-04-09 MED ORDER — LISINOPRIL 20 MG PO TABS: 20.0000 mg | ORAL_TABLET | Freq: Every day | ORAL | 1 refills | Status: DC

## 2020-04-16 ENCOUNTER — Other Ambulatory Visit: Payer: Self-pay | Admitting: Internal Medicine

## 2020-04-16 DIAGNOSIS — E785 Hyperlipidemia, unspecified: Secondary | ICD-10-CM

## 2020-04-16 DIAGNOSIS — I10 Essential (primary) hypertension: Secondary | ICD-10-CM

## 2020-04-16 DIAGNOSIS — E118 Type 2 diabetes mellitus with unspecified complications: Secondary | ICD-10-CM

## 2020-04-16 MED ORDER — SIMVASTATIN 10 MG PO TABS: 10.0000 mg | ORAL_TABLET | Freq: Every day | ORAL | 1 refills | Status: DC

## 2020-06-02 ENCOUNTER — Encounter: Payer: Self-pay | Admitting: Internal Medicine

## 2020-06-02 ENCOUNTER — Ambulatory Visit (INDEPENDENT_AMBULATORY_CARE_PROVIDER_SITE_OTHER): Payer: PPO | Admitting: Internal Medicine

## 2020-06-02 ENCOUNTER — Other Ambulatory Visit: Payer: Self-pay

## 2020-06-02 VITALS — BP 150/70 | HR 89 | Temp 98.9°F | Ht 61.75 in | Wt 164.0 lb

## 2020-06-02 DIAGNOSIS — E785 Hyperlipidemia, unspecified: Secondary | ICD-10-CM

## 2020-06-02 DIAGNOSIS — Z0000 Encounter for general adult medical examination without abnormal findings: Secondary | ICD-10-CM | POA: Diagnosis not present

## 2020-06-02 DIAGNOSIS — E118 Type 2 diabetes mellitus with unspecified complications: Secondary | ICD-10-CM | POA: Diagnosis not present

## 2020-06-02 DIAGNOSIS — R2231 Localized swelling, mass and lump, right upper limb: Secondary | ICD-10-CM | POA: Insufficient documentation

## 2020-06-02 DIAGNOSIS — I10 Essential (primary) hypertension: Secondary | ICD-10-CM | POA: Diagnosis not present

## 2020-06-02 DIAGNOSIS — Z23 Encounter for immunization: Secondary | ICD-10-CM | POA: Insufficient documentation

## 2020-06-02 DIAGNOSIS — Z1231 Encounter for screening mammogram for malignant neoplasm of breast: Secondary | ICD-10-CM

## 2020-06-02 LAB — CBC WITH DIFFERENTIAL/PLATELET
Absolute Monocytes: 465 cells/uL (ref 200–950)
Basophils Absolute: 53 cells/uL (ref 0–200)
Basophils Relative: 0.7 %
Eosinophils Absolute: 83 cells/uL (ref 15–500)
Eosinophils Relative: 1.1 %
HCT: 40.9 % (ref 35.0–45.0)
Hemoglobin: 13.8 g/dL (ref 11.7–15.5)
Lymphs Abs: 2093 cells/uL (ref 850–3900)
MCH: 31.1 pg (ref 27.0–33.0)
MCHC: 33.7 g/dL (ref 32.0–36.0)
MCV: 92.1 fL (ref 80.0–100.0)
MPV: 10.8 fL (ref 7.5–12.5)
Monocytes Relative: 6.2 %
Neutro Abs: 4808 cells/uL (ref 1500–7800)
Neutrophils Relative %: 64.1 %
Platelets: 225 10*3/uL (ref 140–400)
RBC: 4.44 10*6/uL (ref 3.80–5.10)
RDW: 12.3 % (ref 11.0–15.0)
Total Lymphocyte: 27.9 %
WBC: 7.5 10*3/uL (ref 3.8–10.8)

## 2020-06-02 LAB — HEPATIC FUNCTION PANEL
AG Ratio: 2.6 (calc) — ABNORMAL HIGH (ref 1.0–2.5)
ALT: 13 U/L (ref 6–29)
AST: 15 U/L (ref 10–35)
Albumin: 4.7 g/dL (ref 3.6–5.1)
Alkaline phosphatase (APISO): 72 U/L (ref 37–153)
Bilirubin, Direct: 0.2 mg/dL (ref 0.0–0.2)
Globulin: 1.8 g/dL (calc) — ABNORMAL LOW (ref 1.9–3.7)
Indirect Bilirubin: 0.7 mg/dL (calc) (ref 0.2–1.2)
Total Bilirubin: 0.9 mg/dL (ref 0.2–1.2)
Total Protein: 6.5 g/dL (ref 6.1–8.1)

## 2020-06-02 LAB — BASIC METABOLIC PANEL WITH GFR
BUN: 12 mg/dL (ref 7–25)
CO2: 25 mmol/L (ref 20–32)
Calcium: 9.5 mg/dL (ref 8.6–10.4)
Chloride: 104 mmol/L (ref 98–110)
Creat: 0.92 mg/dL (ref 0.60–0.93)
GFR, Est African American: 70 mL/min/{1.73_m2} (ref >=60)
GFR, Est Non African American: 60 mL/min/{1.73_m2} (ref >=60)
Glucose, Bld: 136 mg/dL — ABNORMAL HIGH (ref 65–99)
Potassium: 4.1 mmol/L (ref 3.5–5.3)
Sodium: 141 mmol/L (ref 135–146)

## 2020-06-02 LAB — TSH: TSH: 2.5 mIU/L (ref 0.40–4.50)

## 2020-06-02 MED ORDER — SHINGRIX 50 MCG/0.5ML IM SUSR: 0.5000 mL | Freq: Once | INTRAMUSCULAR | 1 refills | Status: AC

## 2020-06-02 NOTE — Progress Notes (Signed)
Subjective:  Patient ID: Sydney Avila, female    DOB: 11-02-1945  Age: 74 y.o. MRN: [id number]  CC: Annual Exam, Hypertension, Hyperlipidemia, and Diabetes  This visit occurred during the SARS-CoV-2 public health emergency.  Safety protocols were in place, including screening questions prior to the visit, additional usage of staff PPE, and extensive cleaning of exam room while observing appropriate contact time as indicated for disinfecting solutions.    HPI Sydney Avila presents for a CPX.  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  She thinks it has been there for about 2 years.  She is concerned because she thinks it is getting larger.  It does not cause much discomfort.  She is active and denies any recent episodes of chest pain, shortness of breath, palpitations, edema, or fatigue.  Outpatient Medications Prior to Visit  Medication Sig Dispense Refill  . ALPRAZolam (XANAX) 0.5 MG tablet TAKE ONE TABLET BY MOUTH TWICE A DAY AS NEEDED FOR ANXIETY 45 tablet 2  . cholestyramine (QUESTRAN) 4 g packet Take 1 packet (4 g total) by mouth 3 (three) times daily with meals. 360 each 3  . Crisaborole (EUCRISA) 2 % OINT Apply 1 Act topically 2 (two) times a day. 100 g 3  . Dulaglutide (TRULICITY) 8.52 DP/8.2UM SOPN Inject 1 Act into the skin once a week. 12 pen 1  . Lancets (ONETOUCH DELICA PLUS PNTIRW43X) MISC Use to check blood sugars twice daily. DX: E11.9 200 each 3  . lisinopril (ZESTRIL) 20 MG tablet Take 1 tablet (20 mg total) by mouth daily. 90 tablet 1  . metFORMIN (GLUCOPHAGE-XR) 500 MG 24 hr tablet TAKE TWO TABLETS BY MOUTH TWICE A DAY WITH A MEAL 360 tablet 1  . simvastatin (ZOCOR) 10 MG tablet Take 1 tablet (10 mg total) by mouth at bedtime. 90 tablet 1  . levocetirizine (XYZAL) 5 MG tablet Take 1 tablet (5 mg total) by mouth every evening. 90 tablet 1   No facility-administered medications prior to visit.    ROS Review of Systems  Constitutional: Positive for  unexpected weight change (wt gain). Negative for appetite change, chills, diaphoresis and fatigue.  HENT: Negative.   Eyes: Negative for visual disturbance.  Respiratory: Negative for cough, chest tightness, shortness of breath and wheezing.   Cardiovascular: Negative for chest pain, palpitations and leg swelling.  Gastrointestinal: Negative for abdominal pain, constipation, diarrhea, nausea and vomiting.  Endocrine: Negative.  Negative for polydipsia, polyphagia and polyuria.  Genitourinary: Negative.  Negative for difficulty urinating, dysuria and urgency.  Musculoskeletal: Negative.  Negative for arthralgias and myalgias.  Skin: Negative.   Neurological: Negative.  Negative for dizziness, weakness, light-headedness and headaches.  Hematological: Negative for adenopathy. Does not bruise/bleed easily.  Psychiatric/Behavioral: Negative.     Objective:  BP (!) 150/70 (BP Location: Left Arm, Patient Position: Sitting, Cuff Size: Normal)   Pulse 89   Temp 98.9 F (37.2 C) (Oral)   Ht 5' 1.75" (1.568 m)   Wt 164 lb (74.4 kg)   SpO2 97%   BMI 30.24 kg/m   BP Readings from Last 3 Encounters:  06/02/20 (!) 150/70  04/08/20 (!) 160/84  09/29/19 (!) 144/78    Wt Readings from Last 3 Encounters:  06/02/20 164 lb (74.4 kg)  04/08/20 162 lb (73.5 kg)  09/29/19 163 lb 4 oz (74 kg)    Physical Exam Vitals reviewed.  Constitutional:      Appearance: Normal appearance.  HENT:     Nose:  Nose normal.     Mouth/Throat:     Mouth: Mucous membranes are moist.  Eyes:     General: No scleral icterus.    Conjunctiva/sclera: Conjunctivae normal.  Cardiovascular:     Rate and Rhythm: Normal rate and regular rhythm.     Heart sounds: No murmur heard.   Pulmonary:     Effort: Pulmonary effort is normal.     Breath sounds: No stridor. No wheezing, rhonchi or rales.  Abdominal:     General: Abdomen is flat.     Palpations: There is no mass.     Tenderness: There is no abdominal  tenderness. There is no guarding.  Musculoskeletal:        General: Deformity present.     Right shoulder: Deformity present.       Arms:     Cervical back: No tenderness.  Lymphadenopathy:     Cervical: No cervical adenopathy.  Skin:    General: Skin is warm and dry.  Neurological:     General: No focal deficit present.     Mental Status: She is alert.  Psychiatric:        Mood and Affect: Mood normal.        Behavior: Behavior normal.     Lab Results  Component Value Date   WBC 7.5 06/02/2020   HGB 13.8 06/02/2020   HCT 40.9 06/02/2020   PLT 225 06/02/2020   GLUCOSE 136 (H) 06/02/2020   CHOL 136 06/02/2020   TRIG 287 (H) 06/02/2020   HDL 46 (L) 06/02/2020   LDLDIRECT 63.0 09/29/2019   LDLCALC 56 06/02/2020   ALT 13 06/02/2020   AST 15 06/02/2020   NA 141 06/02/2020   K 4.1 06/02/2020   CL 104 06/02/2020   CREATININE 0.92 06/02/2020   BUN 12 06/02/2020   CO2 25 06/02/2020   TSH 2.50 06/02/2020   HGBA1C 6.8 (A) 04/08/2020   MICROALBUR 0.2 06/02/2020    MM 3D SCREEN BREAST BILATERAL  Result Date: 09/11/2018 CLINICAL DATA:  Screening. EXAM: DIGITAL SCREENING BILATERAL MAMMOGRAM WITH TOMO AND CAD COMPARISON:  Previous exam(s). ACR Breast Density Category c: The breast tissue is heterogeneously dense, which may obscure small masses. FINDINGS: There are no findings suspicious for malignancy. Images were processed with CAD. IMPRESSION: No mammographic evidence of malignancy. A result letter of this screening mammogram will be mailed directly to the patient. RECOMMENDATION: Screening mammogram in one year. (Code:[id number]-01Y) BI-RADS CATEGORY  1: Negative. Electronically Signed   By: Everlean Alstrom M.D.   On: 09/11/2018 13:26    Assessment & Plan:   [name] was seen today for annual exam, hypertension, hyperlipidemia and diabetes.  Diagnoses and all orders for this visit:  Essential hypertension, benign- Her blood pressure is not adequately well controlled.  I encouraged  her to improve her lifestyle modifications. -     BASIC METABOLIC PANEL WITH GFR; Future -     CBC with Differential/Platelet; Future -     TSH; Future -     Urinalysis, Routine w reflex microscopic; Future -     TSH -     CBC with Differential/Platelet -     BASIC METABOLIC PANEL WITH GFR -     Urinalysis, Routine w reflex microscopic  Type II diabetes mellitus with manifestations ([place])- Her recent A1c was 6.8%.  Her blood sugars are adequately well controlled. -     Microalbumin / creatinine urine ratio; Future -     BASIC METABOLIC PANEL WITH GFR;  Future -     HM Diabetes Foot Exam -     BASIC METABOLIC PANEL WITH GFR -     Microalbumin / creatinine urine ratio  Routine general medical examination at a health care facility- Exam completed, labs reviewed, vaccines reviewed and updated, she is referred for screening mammogram, other cancer screenings are up-to-date, patient education material was given.  Need for shingles vaccine -     Zoster Vaccine Adjuvanted [facility]) injection; Inject 0.5 mLs into the muscle once for 1 dose.  Hyperlipidemia with target LDL less than 100- She has achieved her LDL goal and is doing well on the statin. -     Lipid panel; Future -     Hepatic function panel; Future -     TSH; Future -     TSH -     Hepatic function panel -     Lipid panel  Mass of skin of shoulder, right- I reassured her that this is likely a lipoma.  I have ordered an MRI to see if there has been a malignant transformation. -     MR SHOULDER RIGHT WO CONTRAST; Future  Visit for screening mammogram -     MM DIGITAL SCREENING BILATERAL; Future  Need for influenza vaccination -     Flu Vaccine QUAD 36+ mos IM   I have discontinued Lemmie Evens. Albert "Sue"'s levocetirizine. I am also having her start on Shingrix. Additionally, I am having her maintain her Eucrisa, cholestyramine, Trulicity, OneTouch Delica Plus [id number], ALPRAZolam, metFORMIN, lisinopril, and simvastatin.  Meds  ordered this encounter  Medications  . Zoster Vaccine Adjuvanted [facility]) injection    Sig: Inject 0.5 mLs into the muscle once for 1 dose.    Dispense:  0.5 mL    Refill:  1   In addition to time spent on CPE, I spent 50 minutes in preparing to see the patient by review of recent labs, imaging and procedures, obtaining and reviewing separately obtained history, communicating with the patient and family or caregiver, ordering medications, tests or procedures, and documenting clinical information in the EHR including the differential Dx, treatment, and any further evaluation and other management of 1. Essential hypertension, benign 2. Type II diabetes mellitus with manifestations ([place]) 3. Hyperlipidemia with target LDL less than 100 4. Mass of skin of shoulder, right    Follow-up: Return in about 6 months (around 12/03/2020).  [name], MD

## 2020-06-02 NOTE — Patient Instructions (Signed)
Health Maintenance, Female Adopting a healthy lifestyle and getting preventive care are important in promoting health and wellness. Ask your health care provider about:  The right schedule for you to have regular tests and exams.  Things you can do on your own to prevent diseases and keep yourself healthy. What should I know about diet, weight, and exercise? Eat a healthy diet   Eat a diet that includes plenty of vegetables, fruits, low-fat dairy products, and lean protein.  Do not eat a lot of foods that are high in solid fats, added sugars, or sodium. Maintain a healthy weight Body mass index (BMI) is used to identify weight problems. It estimates body fat based on height and weight. Your health care provider can help determine your BMI and help you achieve or maintain a healthy weight. Get regular exercise Get regular exercise. This is one of the most important things you can do for your health. Most adults should:  Exercise for at least 150 minutes each week. The exercise should increase your heart rate and make you sweat (moderate-intensity exercise).  Do strengthening exercises at least twice a week. This is in addition to the moderate-intensity exercise.  Spend less time sitting. Even light physical activity can be beneficial. Watch cholesterol and blood lipids Have your blood tested for lipids and cholesterol at 76 years of age, then have this test every 5 years. Have your cholesterol levels checked more often if:  Your lipid or cholesterol levels are high.  You are older than 76 years of age.  You are at high risk for heart disease. What should I know about cancer screening? Depending on your health history and family history, you may need to have cancer screening at various ages. This may include screening for:  Breast cancer.  Cervical cancer.  Colorectal cancer.  Skin cancer.  Lung cancer. What should I know about heart disease, diabetes, and high blood  pressure? Blood pressure and heart disease  High blood pressure causes heart disease and increases the risk of stroke. This is more likely to develop in people who have high blood pressure readings, are of African descent, or are overweight.  Have your blood pressure checked: ? Every 3-5 years if you are 18-39 years of age. ? Every year if you are 40 years old or older. Diabetes Have regular diabetes screenings. This checks your fasting blood sugar level. Have the screening done:  Once every three years after age 40 if you are at a normal weight and have a low risk for diabetes.  More often and at a younger age if you are overweight or have a high risk for diabetes. What should I know about preventing infection? Hepatitis B If you have a higher risk for hepatitis B, you should be screened for this virus. Talk with your health care provider to find out if you are at risk for hepatitis B infection. Hepatitis C Testing is recommended for:  Everyone born from 1945 through 1965.  Anyone with known risk factors for hepatitis C. Sexually transmitted infections (STIs)  Get screened for STIs, including gonorrhea and chlamydia, if: ? You are sexually active and are younger than 76 years of age. ? You are older than 76 years of age and your health care provider tells you that you are at risk for this type of infection. ? Your sexual activity has changed since you were last screened, and you are at increased risk for chlamydia or gonorrhea. Ask your health care provider if   you are at risk.  Ask your health care provider about whether you are at high risk for HIV. Your health care provider may recommend a prescription medicine to help prevent HIV infection. If you choose to take medicine to prevent HIV, you should first get tested for HIV. You should then be tested every 3 months for as long as you are taking the medicine. Pregnancy  If you are about to stop having your period (premenopausal) and  you may become pregnant, seek counseling before you get pregnant.  Take 400 to 800 micrograms (mcg) of folic acid every day if you become pregnant.  Ask for birth control (contraception) if you want to prevent pregnancy. Osteoporosis and menopause Osteoporosis is a disease in which the bones lose minerals and strength with aging. This can result in bone fractures. If you are 65 years old or older, or if you are at risk for osteoporosis and fractures, ask your health care provider if you should:  Be screened for bone loss.  Take a calcium or vitamin D supplement to lower your risk of fractures.  Be given hormone replacement therapy (HRT) to treat symptoms of menopause. Follow these instructions at home: Lifestyle  Do not use any products that contain nicotine or tobacco, such as cigarettes, e-cigarettes, and chewing tobacco. If you need help quitting, ask your health care provider.  Do not use street drugs.  Do not share needles.  Ask your health care provider for help if you need support or information about quitting drugs. Alcohol use  Do not drink alcohol if: ? Your health care provider tells you not to drink. ? You are pregnant, may be pregnant, or are planning to become pregnant.  If you drink alcohol: ? Limit how much you use to 0-1 drink a day. ? Limit intake if you are breastfeeding.  Be aware of how much alcohol is in your drink. In the [place] one drink equals one 12 oz bottle of beer (355 mL), one 5 oz glass of wine (148 mL), or one 1 oz glass of hard liquor (44 mL). General instructions  Schedule regular health, dental, and eye exams.  Stay current with your vaccines.  Tell your health care provider if: ? You often feel depressed. ? You have ever been abused or do not feel safe at home. Summary  Adopting a healthy lifestyle and getting preventive care are important in promoting health and wellness.  Follow your health care provider's instructions about healthy  diet, exercising, and getting tested or screened for diseases.  Follow your health care provider's instructions on monitoring your cholesterol and blood pressure. This information is not intended to replace advice given to you by your health care provider. Make sure you discuss any questions you have with your health care provider. Document Revised: 09/18/2018 Document Reviewed: 09/18/2018 Elsevier Patient Education  2020 [employer]  

## 2020-06-03 LAB — URINALYSIS, ROUTINE W REFLEX MICROSCOPIC
Bilirubin Urine: NEGATIVE
Glucose, UA: NEGATIVE
Hgb urine dipstick: NEGATIVE
Ketones, ur: NEGATIVE
Leukocytes,Ua: NEGATIVE
Nitrite: NEGATIVE
Protein, ur: NEGATIVE
Specific Gravity, Urine: 1.006 (ref 1.001–1.03)
pH: <=5 (ref 5.0–8.0)

## 2020-06-03 LAB — LIPID PANEL
Cholesterol: 136 mg/dL (ref <200)
HDL: 46 mg/dL — ABNORMAL LOW (ref >=50)
LDL Cholesterol (Calc): 56 mg/dL (calc)
Non-HDL Cholesterol (Calc): 90 mg/dL (calc) (ref <130)
Total CHOL/HDL Ratio: 3 (calc) (ref <5.0)
Triglycerides: 287 mg/dL — ABNORMAL HIGH (ref <150)

## 2020-06-03 LAB — MICROALBUMIN / CREATININE URINE RATIO
Creatinine, Urine: 30 mg/dL (ref 20–275)
Microalb Creat Ratio: 7 mcg/mg creat (ref <30)
Microalb, Ur: 0.2 mg/dL

## 2020-06-15 ENCOUNTER — Telehealth: Payer: Self-pay | Admitting: Internal Medicine

## 2020-06-15 ENCOUNTER — Other Ambulatory Visit: Payer: Self-pay | Admitting: Internal Medicine

## 2020-06-15 DIAGNOSIS — M25511 Pain in right shoulder: Secondary | ICD-10-CM

## 2020-06-15 DIAGNOSIS — G8929 Other chronic pain: Secondary | ICD-10-CM | POA: Insufficient documentation

## 2020-06-15 NOTE — Telephone Encounter (Signed)
    Patient states she would like a referral  to see Dr Tonita Cong at [facility] She realized her shoulder issue "bubble on arm"  Is from walking her dog.  She often has jerking movements in her arm while using retractable dog leash

## 2020-06-28 ENCOUNTER — Ambulatory Visit
Admission: RE | Admit: 2020-06-28 | Discharge: 2020-06-28 | Disposition: A | Discharge status: Home / Self Care | Payer: PPO | Source: Ambulatory Visit | Attending: Internal Medicine | Admitting: Internal Medicine

## 2020-06-28 DIAGNOSIS — M7551 Bursitis of right shoulder: Secondary | ICD-10-CM | POA: Diagnosis not present

## 2020-06-28 DIAGNOSIS — R2231 Localized swelling, mass and lump, right upper limb: Secondary | ICD-10-CM | POA: Diagnosis not present

## 2020-06-28 DIAGNOSIS — D1779 Benign lipomatous neoplasm of other sites: Secondary | ICD-10-CM | POA: Diagnosis not present

## 2020-06-28 DIAGNOSIS — M19011 Primary osteoarthritis, right shoulder: Secondary | ICD-10-CM | POA: Diagnosis not present

## 2020-06-29 ENCOUNTER — Telehealth: Payer: Self-pay | Admitting: Internal Medicine

## 2020-06-29 NOTE — Telephone Encounter (Signed)
   Patient states she would prefer to speak with Dr Ronnald Ramp when he arrives to discuss results and follow up care

## 2020-06-29 NOTE — Telephone Encounter (Signed)
Called pt, LVM.   

## 2020-06-29 NOTE — Telephone Encounter (Signed)
No tear noted; lots of arthritis changes- please scheduled her to see one of the sports medicine providers for follow-up.

## 2020-06-29 NOTE — Telephone Encounter (Signed)
Called pt for clarification. Pt stated she would like to wait for PCP to return for him to review results and make his notations and go from there.

## 2020-06-29 NOTE — Telephone Encounter (Signed)
Patient would like a call re: MRI results. I did let the patient know that Dr. Ronnald Ramp is off this week

## 2020-06-29 NOTE — Telephone Encounter (Signed)
Please advise on MRI results. Thank you.

## 2020-07-19 ENCOUNTER — Other Ambulatory Visit: Payer: Self-pay

## 2020-07-19 ENCOUNTER — Encounter (HOSPITAL_BASED_OUTPATIENT_CLINIC_OR_DEPARTMENT_OTHER): Payer: Self-pay

## 2020-07-19 ENCOUNTER — Ambulatory Visit (HOSPITAL_BASED_OUTPATIENT_CLINIC_OR_DEPARTMENT_OTHER)
Admission: RE | Admit: 2020-07-19 | Discharge: 2020-07-19 | Disposition: A | Discharge status: Home / Self Care | Payer: PPO | Source: Ambulatory Visit | Attending: Internal Medicine | Admitting: Internal Medicine

## 2020-07-19 DIAGNOSIS — Z1231 Encounter for screening mammogram for malignant neoplasm of breast: Secondary | ICD-10-CM | POA: Diagnosis not present

## 2020-07-22 ENCOUNTER — Encounter: Payer: Self-pay | Admitting: Internal Medicine

## 2020-07-22 LAB — HM MAMMOGRAPHY

## 2020-08-02 DIAGNOSIS — E119 Type 2 diabetes mellitus without complications: Secondary | ICD-10-CM | POA: Diagnosis not present

## 2020-08-02 DIAGNOSIS — H524 Presbyopia: Secondary | ICD-10-CM | POA: Diagnosis not present

## 2020-08-02 DIAGNOSIS — Z7984 Long term (current) use of oral hypoglycemic drugs: Secondary | ICD-10-CM | POA: Diagnosis not present

## 2020-08-02 DIAGNOSIS — Z961 Presence of intraocular lens: Secondary | ICD-10-CM | POA: Diagnosis not present

## 2020-08-02 LAB — HM DIABETES EYE EXAM

## 2020-08-06 ENCOUNTER — Other Ambulatory Visit: Payer: Self-pay | Admitting: Internal Medicine

## 2020-08-06 DIAGNOSIS — F411 Generalized anxiety disorder: Secondary | ICD-10-CM

## 2020-09-06 ENCOUNTER — Encounter: Payer: Self-pay | Admitting: Internal Medicine

## 2020-09-07 ENCOUNTER — Other Ambulatory Visit: Payer: Self-pay | Admitting: Internal Medicine

## 2020-09-07 DIAGNOSIS — K9089 Other intestinal malabsorption: Secondary | ICD-10-CM

## 2020-10-14 DIAGNOSIS — L820 Inflamed seborrheic keratosis: Secondary | ICD-10-CM | POA: Diagnosis not present

## 2020-10-14 DIAGNOSIS — L570 Actinic keratosis: Secondary | ICD-10-CM | POA: Diagnosis not present

## 2020-10-14 DIAGNOSIS — B078 Other viral warts: Secondary | ICD-10-CM | POA: Diagnosis not present

## 2020-10-14 DIAGNOSIS — L821 Other seborrheic keratosis: Secondary | ICD-10-CM | POA: Diagnosis not present

## 2020-10-25 ENCOUNTER — Other Ambulatory Visit: Payer: Self-pay | Admitting: Internal Medicine

## 2020-10-25 DIAGNOSIS — E118 Type 2 diabetes mellitus with unspecified complications: Secondary | ICD-10-CM

## 2020-10-25 DIAGNOSIS — I10 Essential (primary) hypertension: Secondary | ICD-10-CM

## 2020-10-27 ENCOUNTER — Other Ambulatory Visit: Payer: Self-pay | Admitting: Internal Medicine

## 2020-10-27 DIAGNOSIS — I10 Essential (primary) hypertension: Secondary | ICD-10-CM

## 2020-10-27 DIAGNOSIS — E118 Type 2 diabetes mellitus with unspecified complications: Secondary | ICD-10-CM

## 2020-10-29 ENCOUNTER — Telehealth: Payer: Self-pay | Admitting: Internal Medicine

## 2020-10-29 DIAGNOSIS — I10 Essential (primary) hypertension: Secondary | ICD-10-CM

## 2020-10-29 DIAGNOSIS — E118 Type 2 diabetes mellitus with unspecified complications: Secondary | ICD-10-CM

## 2020-10-29 MED ORDER — LISINOPRIL 20 MG PO TABS: 20.0000 mg | ORAL_TABLET | Freq: Every day | ORAL | 0 refills | Status: DC

## 2020-10-29 MED ORDER — METFORMIN HCL ER 500 MG PO TB24: 1000.0000 mg | ORAL_TABLET | Freq: Two times a day (BID) | ORAL | 0 refills | Status: DC

## 2020-10-29 NOTE — Telephone Encounter (Signed)
metFORMIN (GLUCOPHAGE-XR) 500 MG 24 hr tablet lisinopril (ZESTRIL) 20 MG tablet [facility] [address] [place] Phone:  [phone]  Fax:  [phone]     Patient made an appointment for 02.02.22 and is wondering if she can get a short term supply refill Patient also wondering if she needs to get any lab work done before her appointment, and if she does give her a call and let her know.

## 2020-10-29 NOTE — Telephone Encounter (Signed)
Short supply sent to pharmacy as requested.

## 2020-11-05 ENCOUNTER — Telehealth: Payer: Self-pay | Admitting: Internal Medicine

## 2020-11-05 NOTE — Progress Notes (Signed)
  Chronic Care Management   Note  11/05/2020 Name: Sydney Avila MRN: [id number] DOB: Nov 20, 1945  Sydney Avila is a 75 y.o. year old female who is a primary care patient of Janith Lima, MD. I reached out to [name] by phone today in response to a referral sent by Ms. [place] PCP, Janith Lima, MD.   Ms. Ahmad was given information about Chronic Care Management services today including:  1. CCM service includes personalized support from designated clinical staff supervised by her physician, including individualized plan of care and coordination with other care providers 2. 24/7 contact phone numbers for assistance for urgent and routine care needs. 3. Service will only be billed when office clinical staff spend 20 minutes or more in a month to coordinate care. 4. Only one practitioner may furnish and bill the service in a calendar month. 5. The patient may stop CCM services at any time (effective at the end of the month) by phone call to the office staff.   Patient wishes to consider information provided and/or speak with a member of the care team before deciding about enrollment in care management services.   Follow up plan:   Carley Perdue [employer] Scheduler

## 2020-11-10 ENCOUNTER — Encounter: Payer: Self-pay | Admitting: Internal Medicine

## 2020-11-10 ENCOUNTER — Ambulatory Visit (INDEPENDENT_AMBULATORY_CARE_PROVIDER_SITE_OTHER): Payer: PPO | Admitting: Internal Medicine

## 2020-11-10 ENCOUNTER — Other Ambulatory Visit: Payer: Self-pay

## 2020-11-10 DIAGNOSIS — I10 Essential (primary) hypertension: Secondary | ICD-10-CM

## 2020-11-10 DIAGNOSIS — E785 Hyperlipidemia, unspecified: Secondary | ICD-10-CM | POA: Diagnosis not present

## 2020-11-10 DIAGNOSIS — E118 Type 2 diabetes mellitus with unspecified complications: Secondary | ICD-10-CM

## 2020-11-10 DIAGNOSIS — F411 Generalized anxiety disorder: Secondary | ICD-10-CM | POA: Diagnosis not present

## 2020-11-10 LAB — BASIC METABOLIC PANEL
BUN: 14 mg/dL (ref 6–23)
CO2: 27 mEq/L (ref 19–32)
Calcium: 9.1 mg/dL (ref 8.4–10.5)
Chloride: 104 mEq/L (ref 96–112)
Creatinine, Ser: 0.89 mg/dL (ref 0.40–1.20)
GFR: 62.75 mL/min (ref >60.00)
Glucose, Bld: 161 mg/dL — ABNORMAL HIGH (ref 70–99)
Potassium: 4 mEq/L (ref 3.5–5.1)
Sodium: 139 mEq/L (ref 135–145)

## 2020-11-10 LAB — LIPID PANEL
Cholesterol: 117 mg/dL (ref 0–200)
HDL: 42.8 mg/dL (ref >39.00)
NonHDL: 73.8
Total CHOL/HDL Ratio: 3
Triglycerides: 276 mg/dL — ABNORMAL HIGH (ref 0.0–149.0)
VLDL: 55.2 mg/dL — ABNORMAL HIGH (ref 0.0–40.0)

## 2020-11-10 LAB — HEMOGLOBIN A1C: Hgb A1c MFr Bld: 7.3 % — ABNORMAL HIGH (ref 4.6–6.5)

## 2020-11-10 LAB — LDL CHOLESTEROL, DIRECT: Direct LDL: 50 mg/dL

## 2020-11-10 MED ORDER — METFORMIN HCL ER 500 MG PO TB24: 1000.0000 mg | ORAL_TABLET | Freq: Two times a day (BID) | ORAL | 1 refills | Status: DC

## 2020-11-10 MED ORDER — LISINOPRIL 20 MG PO TABS: 20.0000 mg | ORAL_TABLET | Freq: Every day | ORAL | 1 refills | Status: DC

## 2020-11-10 MED ORDER — SIMVASTATIN 10 MG PO TABS: 10.0000 mg | ORAL_TABLET | Freq: Every day | ORAL | 1 refills | Status: DC

## 2020-11-10 MED ORDER — ALPRAZOLAM 0.5 MG PO TABS
0.5000 mg | ORAL_TABLET | Freq: Two times a day (BID) | ORAL | 3 refills | Status: DC | PRN
Start: 2020-11-10 — End: 2021-06-11

## 2020-11-10 NOTE — Patient Instructions (Signed)
Type 2 Diabetes Mellitus, Diagnosis, Adult Type 2 diabetes (type 2 diabetes mellitus) is a long-term, or chronic, disease. In type 2 diabetes, one or both of these problems may be present:  The pancreas does not make enough of a hormone called insulin.  Cells in the body do not respond properly to insulin that the body makes (insulin resistance). Normally, insulin allows blood sugar (glucose) to enter cells in the body. The cells use glucose for energy. Insulin resistance or lack of insulin causes excess glucose to build up in the blood instead of going into cells. This causes high blood glucose (hyperglycemia).  What are the causes? The exact cause of type 2 diabetes is not known. What increases the risk? The following factors may make you more likely to develop this condition:  Having a family member with type 2 diabetes.  Being overweight or obese.  Being inactive (sedentary).  Having been diagnosed with insulin resistance.  Having a history of prediabetes, diabetes when you were pregnant (gestational diabetes), or polycystic ovary syndrome (PCOS). What are the signs or symptoms? In the early stage of this condition, you may not have symptoms. Symptoms develop slowly and may include:  Increased thirst or hunger.  Increased urination.  Unexplained weight loss.  Tiredness (fatigue) or weakness.  Vision changes, such as blurry vision.  Dark patches on the skin. How is this diagnosed? This condition is diagnosed based on your symptoms, your medical history, a physical exam, and your blood glucose level. Your blood glucose may be checked with one or more of the following blood tests:  A fasting blood glucose (FBG) test. You will not be allowed to eat (you will fast) for 8 hours or longer before a blood sample is taken.  A random blood glucose test. This test checks blood glucose at any time of day regardless of when you ate.  An A1C (hemoglobin A1C) blood test. This test  provides information about blood glucose levels over the previous 2-3 months.  An oral glucose tolerance test (OGTT). This test measures your blood glucose at two times: ? After fasting. This is your baseline blood glucose level. ? Two hours after drinking a beverage that contains glucose. You may be diagnosed with type 2 diabetes if:  Your fasting blood glucose level is 126 mg/dL (7.0 mmol/L) or higher.  Your random blood glucose level is 200 mg/dL (11.1 mmol/L) or higher.  Your A1C level is 6.5% or higher.  Your oral glucose tolerance test result is higher than 200 mg/dL (11.1 mmol/L). These blood tests may be repeated to confirm your diagnosis.   How is this treated? Your treatment may be managed by a specialist called an endocrinologist. Type 2 diabetes may be treated by following instructions from your health care provider about:  Making dietary and lifestyle changes. These may include: ? Following a personalized nutrition plan that is developed by a registered dietitian. ? Exercising regularly. ? Finding ways to manage stress.  Checking your blood glucose level as often as told.  Taking diabetes medicines or insulin daily. This helps to keep your blood glucose levels in the healthy range.  Taking medicines to help prevent complications from diabetes. Medicines may include: ? Aspirin. ? Medicine to lower cholesterol. ? Medicine to control blood pressure. Your health care provider will set treatment goals for you. Your goals will be based on your age, other medical conditions you have, and how you respond to diabetes treatment. Generally, the goal of treatment is to maintain the   following blood glucose levels:  Before meals: 80-130 mg/dL (4.4-7.2 mmol/L).  After meals: below 180 mg/dL (10 mmol/L).  A1C level: less than 7%. Follow these instructions at home: Questions to ask your health care provider Consider asking the following questions:  Should I meet with a certified  diabetes care and education specialist?  What diabetes medicines do I need, and when should I take them?  What equipment will I need to manage my diabetes at home?  How often do I need to check my blood glucose?  Where can I find a support group for people with diabetes?  What number can I call if I have questions?  When is my next appointment? General instructions  Take over-the-counter and prescription medicines only as told by your health care provider.  Keep all follow-up visits as told by your health care provider. This is important. Where to find more information  American Diabetes Association (ADA): www.diabetes.org  American Association of Diabetes Care and Education Specialists (ADCES): www.diabeteseducator.org  International Diabetes Federation (IDF): www.idf.org Contact a health care provider if:  Your blood glucose is at or above 240 mg/dL (13.3 mmol/L) for 2 days in a row.  You have been sick or have had a fever for 2 days or longer, and you are not getting better.  You have any of the following problems for more than 6 hours: ? You cannot eat or drink. ? You have nausea and vomiting. ? You have diarrhea. Get help right away if:  You have severe hypoglycemia. This means your blood glucose is lower than 54 mg/dL (3.0 mmol/L).  You become confused or you have trouble thinking clearly.  You have difficulty breathing.  You have moderate or large ketone levels in your urine. These symptoms may represent a serious problem that is an emergency. Do not wait to see if the symptoms will go away. Get medical help right away. Call your local emergency services (911 in the [place] Do not drive yourself to the hospital. Summary  Type 2 diabetes (type 2 diabetes mellitus) is a long-term, or chronic, disease. In type 2 diabetes, the pancreas does not make enough of a hormone called insulin, or cells in the body do not respond properly to insulin that the body makes (insulin  resistance).  This condition is treated by making dietary and lifestyle changes and taking diabetes medicines or insulin.  Your health care provider will set treatment goals for you. Your goals will be based on your age, other medical conditions you have, and how you respond to diabetes treatment.  Keep all follow-up visits as told by your health care provider. This is important. This information is not intended to replace advice given to you by your health care provider. Make sure you discuss any questions you have with your health care provider. Document Revised: 04/21/2020 Document Reviewed: 04/21/2020 Elsevier Patient Education  2021 Elsevier Inc.  

## 2020-11-10 NOTE — Progress Notes (Signed)
Subjective:  Patient ID: Sydney Avila, female    DOB: 07-May-1946  Age: 75 y.o. MRN: [id number]  CC: Hypertension and Diabetes  This visit occurred during the SARS-CoV-2 public health emergency.  Safety protocols were in place, including screening questions prior to the visit, additional usage of staff PPE, and extensive cleaning of exam room while observing appropriate contact time as indicated for disinfecting solutions.    HPI MARKALA NESTOR presents for f/up - She reports that her blood pressure and blood sugar have been well controlled.  She is active and denies any recent episodes of chest pain, shortness of breath, palpitations, edema, or fatigue.  Outpatient Medications Prior to Visit  Medication Sig Dispense Refill  . cholestyramine (QUESTRAN) 4 g packet TAKE ONE PACKET BY MOUTH THREE TIMES A DAY WITH MEALS 90 each 5  . Crisaborole (EUCRISA) 2 % OINT Apply 1 Act topically 2 (two) times a day. 100 g 3  . Dulaglutide (TRULICITY) A999333 0000000 SOPN Inject 1 Act into the skin once a week. 12 pen 1  . Lancets (ONETOUCH DELICA PLUS 123XX123) MISC Use to check blood sugars twice daily. DX: E11.9 200 each 3  . ALPRAZolam (XANAX) 0.5 MG tablet TAKE ONE TABLET BY MOUTH TWICE A DAY AS NEEDED FOR ANXIETY 45 tablet 3  . lisinopril (ZESTRIL) 20 MG tablet Take 1 tablet (20 mg total) by mouth daily. 30 tablet 0  . metFORMIN (GLUCOPHAGE-XR) 500 MG 24 hr tablet Take 2 tablets (1,000 mg total) by mouth 2 (two) times daily before a meal. = 60 tablet 0  . simvastatin (ZOCOR) 10 MG tablet Take 1 tablet (10 mg total) by mouth at bedtime. 90 tablet 1   No facility-administered medications prior to visit.    ROS Review of Systems  Constitutional: Negative for appetite change, diaphoresis, fatigue and unexpected weight change.  HENT: Negative.   Eyes: Negative.   Respiratory: Negative for cough, chest tightness, shortness of breath and wheezing.   Cardiovascular: Negative for chest pain, palpitations  and leg swelling.  Gastrointestinal: Negative for abdominal pain, diarrhea, nausea and vomiting.  Endocrine: Negative for polydipsia, polyphagia and polyuria.  Genitourinary: Negative.  Negative for difficulty urinating.  Musculoskeletal: Negative for arthralgias and myalgias.  Skin: Negative.  Negative for color change.  Neurological: Negative.  Negative for dizziness, weakness, light-headedness and headaches.  Hematological: Negative for adenopathy. Does not bruise/bleed easily.  Psychiatric/Behavioral: Negative for dysphoric mood, sleep disturbance and suicidal ideas. The patient is nervous/anxious.     Objective:  BP 126/76   Pulse 77   Temp 97.7 F (36.5 C) (Oral)   Resp 16   Ht 5' 1.75" (1.568 m)   Wt 164 lb (74.4 kg)   SpO2 97%   BMI 30.24 kg/m   BP Readings from Last 3 Encounters:  11/10/20 126/76  06/02/20 (!) 150/70  04/08/20 (!) 160/84    Wt Readings from Last 3 Encounters:  11/10/20 164 lb (74.4 kg)  06/02/20 164 lb (74.4 kg)  04/08/20 162 lb (73.5 kg)    Physical Exam Vitals reviewed.  Constitutional:      Appearance: Normal appearance.  HENT:     Nose: Nose normal.     Mouth/Throat:     Mouth: Mucous membranes are moist.  Eyes:     General: No scleral icterus.    Conjunctiva/sclera: Conjunctivae normal.  Cardiovascular:     Rate and Rhythm: Normal rate and regular rhythm.     Heart sounds: No murmur heard.  Pulmonary:     Effort: Pulmonary effort is normal.     Breath sounds: No stridor. No wheezing, rhonchi or rales.  Abdominal:     General: Abdomen is flat. Bowel sounds are normal. There is no distension.     Palpations: Abdomen is soft. There is no hepatomegaly, splenomegaly or mass.     Tenderness: There is no abdominal tenderness.  Musculoskeletal:        General: Normal range of motion.     Cervical back: Neck supple.     Right lower leg: No edema.     Left lower leg: No edema.  Lymphadenopathy:     Cervical: No cervical adenopathy.   Skin:    General: Skin is warm and dry.     Coloration: Skin is not pale.  Neurological:     General: No focal deficit present.     Mental Status: She is alert.  Psychiatric:        Mood and Affect: Mood normal.     Lab Results  Component Value Date   WBC 7.5 06/02/2020   HGB 13.8 06/02/2020   HCT 40.9 06/02/2020   PLT 225 06/02/2020   GLUCOSE 161 (H) 11/10/2020   CHOL 117 11/10/2020   TRIG 276.0 (H) 11/10/2020   HDL 42.80 11/10/2020   LDLDIRECT 50.0 11/10/2020   LDLCALC 56 06/02/2020   ALT 13 06/02/2020   AST 15 06/02/2020   NA 139 11/10/2020   K 4.0 11/10/2020   CL 104 11/10/2020   CREATININE 0.89 11/10/2020   BUN 14 11/10/2020   CO2 27 11/10/2020   TSH 2.50 06/02/2020   HGBA1C 7.3 (H) 11/10/2020   MICROALBUR 0.2 06/02/2020    MM 3D SCREEN BREAST BILATERAL  Result Date: 07/22/2020 CLINICAL DATA:  Screening. EXAM: DIGITAL SCREENING BILATERAL MAMMOGRAM WITH TOMO AND CAD COMPARISON:  Previous exam(s). ACR Breast Density Category c: The breast tissue is heterogeneously dense, which may obscure small masses. FINDINGS: There are no findings suspicious for malignancy. Images were processed with CAD. IMPRESSION: No mammographic evidence of malignancy. A result letter of this screening mammogram will be mailed directly to the patient. RECOMMENDATION: Screening mammogram in one year. (Code:SM-B-01Y) BI-RADS CATEGORY  1: Negative. Electronically Signed   By: Claudie Revering M.D.   On: 07/22/2020 16:54    Assessment & Plan:   Chamya was seen today for hypertension and diabetes.  Diagnoses and all orders for this visit:  Hyperlipidemia with target LDL less than 100- She has achieved her LDL goal and is doing well on the statin. -     simvastatin (ZOCOR) 10 MG tablet; Take 1 tablet (10 mg total) by mouth at bedtime. -     Lipid panel; Future -     Lipid panel  Type II diabetes mellitus with manifestations (HCC) -     metFORMIN (GLUCOPHAGE-XR) 500 MG 24 hr tablet; Take 2 tablets  (1,000 mg total) by mouth 2 (two) times daily before a meal. = -     lisinopril (ZESTRIL) 20 MG tablet; Take 1 tablet (20 mg total) by mouth daily. -     Basic metabolic panel; Future -     Hemoglobin A1c; Future -     Hemoglobin A1c -     Basic metabolic panel  Type 2 diabetes mellitus with complication, without long-term current use of insulin ([place])- Her blood sugar is adequately well controlled.  Will continue the current regimen. -     metFORMIN (GLUCOPHAGE-XR) 500 MG 24 hr tablet;  Take 2 tablets (1,000 mg total) by mouth 2 (two) times daily before a meal. = -     lisinopril (ZESTRIL) 20 MG tablet; Take 1 tablet (20 mg total) by mouth daily. -     Basic metabolic panel; Future -     Hemoglobin A1c; Future -     Hemoglobin A1c -     Basic metabolic panel  Essential hypertension, benign- Her blood pressure is well controlled. -     lisinopril (ZESTRIL) 20 MG tablet; Take 1 tablet (20 mg total) by mouth daily. -     Basic metabolic panel; Future -     Basic metabolic panel  GAD (generalized anxiety disorder) -     ALPRAZolam (XANAX) 0.5 MG tablet; Take 1 tablet (0.5 mg total) by mouth 2 (two) times daily as needed for anxiety.  Other orders -     LDL cholesterol, direct   I have changed Lemmie Evens. Kosmicki "Sue"'s ALPRAZolam. I am also having her maintain her Eucrisa, Trulicity, OneTouch Delica Plus [id number], cholestyramine, simvastatin, metFORMIN, and lisinopril.  Meds ordered this encounter  Medications  . simvastatin (ZOCOR) 10 MG tablet    Sig: Take 1 tablet (10 mg total) by mouth at bedtime.    Dispense:  90 tablet    Refill:  1  . metFORMIN (GLUCOPHAGE-XR) 500 MG 24 hr tablet    Sig: Take 2 tablets (1,000 mg total) by mouth 2 (two) times daily before a meal. =    Dispense:  180 tablet    Refill:  1  . lisinopril (ZESTRIL) 20 MG tablet    Sig: Take 1 tablet (20 mg total) by mouth daily.    Dispense:  90 tablet    Refill:  1  . ALPRAZolam (XANAX) 0.5 MG tablet    Sig:  Take 1 tablet (0.5 mg total) by mouth 2 (two) times daily as needed for anxiety.    Dispense:  45 tablet    Refill:  3     Follow-up: Return in about 6 months (around 05/10/2021).  [name], MD

## 2020-11-18 ENCOUNTER — Other Ambulatory Visit: Payer: Self-pay | Admitting: Internal Medicine

## 2020-11-18 DIAGNOSIS — E118 Type 2 diabetes mellitus with unspecified complications: Secondary | ICD-10-CM

## 2020-11-25 ENCOUNTER — Other Ambulatory Visit: Payer: Self-pay | Admitting: Internal Medicine

## 2020-11-25 DIAGNOSIS — I10 Essential (primary) hypertension: Secondary | ICD-10-CM

## 2020-11-25 DIAGNOSIS — E118 Type 2 diabetes mellitus with unspecified complications: Secondary | ICD-10-CM

## 2021-01-17 DIAGNOSIS — E1351 Other specified diabetes mellitus with diabetic peripheral angiopathy without gangrene: Secondary | ICD-10-CM | POA: Diagnosis not present

## 2021-01-17 DIAGNOSIS — M21962 Unspecified acquired deformity of left lower leg: Secondary | ICD-10-CM | POA: Diagnosis not present

## 2021-01-17 DIAGNOSIS — M21621 Bunionette of right foot: Secondary | ICD-10-CM | POA: Diagnosis not present

## 2021-01-17 DIAGNOSIS — D2372 Other benign neoplasm of skin of left lower limb, including hip: Secondary | ICD-10-CM | POA: Diagnosis not present

## 2021-04-04 ENCOUNTER — Other Ambulatory Visit: Payer: Self-pay

## 2021-04-04 ENCOUNTER — Encounter: Payer: Self-pay | Admitting: Internal Medicine

## 2021-04-04 ENCOUNTER — Ambulatory Visit (INDEPENDENT_AMBULATORY_CARE_PROVIDER_SITE_OTHER): Payer: PPO | Admitting: Internal Medicine

## 2021-04-04 VITALS — BP 128/70 | HR 72 | Temp 98.0°F | Resp 18 | Ht 61.75 in | Wt 162.2 lb

## 2021-04-04 DIAGNOSIS — E118 Type 2 diabetes mellitus with unspecified complications: Secondary | ICD-10-CM | POA: Diagnosis not present

## 2021-04-04 DIAGNOSIS — M5441 Lumbago with sciatica, right side: Secondary | ICD-10-CM

## 2021-04-04 LAB — HEMOGLOBIN A1C: Hgb A1c MFr Bld: 7.4 % — ABNORMAL HIGH (ref 4.6–6.5)

## 2021-04-04 MED ORDER — PREDNISONE 20 MG PO TABS: 40.0000 mg | ORAL_TABLET | Freq: Every day | ORAL | 0 refills | Status: AC

## 2021-04-04 MED ORDER — METHOCARBAMOL 500 MG PO TABS: 500.0000 mg | ORAL_TABLET | Freq: Three times a day (TID) | ORAL | 0 refills | Status: DC | PRN

## 2021-04-04 NOTE — Progress Notes (Signed)
   Subjective:   Patient ID: Sydney Avila, female    DOB: 08-May-1946, 75 y.o.   MRN: [id number]  HPI The patient is a 75 YO female coming in for concerns about pain right buttock. 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 Pain in that area and some radiation down her leg. No numbness or weakness. Using aleve and cold compress with some success. Pain is improving but still high.   Review of Systems  Constitutional: Negative.   HENT: Negative.    Eyes: Negative.   Respiratory:  Negative for cough, chest tightness and shortness of breath.   Cardiovascular:  Negative for chest pain, palpitations and leg swelling.  Gastrointestinal:  Negative for abdominal distention, abdominal pain, constipation, diarrhea, nausea and vomiting.  Musculoskeletal:  Positive for back pain and myalgias.  Skin: Negative.   Neurological: Negative.   Psychiatric/Behavioral: Negative.     Objective:  Physical Exam Constitutional:      Appearance: She is well-developed.  HENT:     Head: Normocephalic and atraumatic.  Cardiovascular:     Rate and Rhythm: Normal rate and regular rhythm.  Pulmonary:     Effort: Pulmonary effort is normal. No respiratory distress.     Breath sounds: Normal breath sounds. No wheezing or rales.  Abdominal:     General: Bowel sounds are normal. There is no distension.     Palpations: Abdomen is soft.     Tenderness: There is no abdominal tenderness. There is no rebound.  Musculoskeletal:     Cervical back: Normal range of motion.     Comments: Right SI region pain  Skin:    General: Skin is warm and dry.  Neurological:     Mental Status: She is alert and oriented to person, place, and time.     Coordination: Coordination normal.    Vitals:   04/04/21 0840  BP: 128/70  Pulse: 72  Resp: 18  Temp: 98 F (36.7 C)  TempSrc: Oral  SpO2: 98%  Weight: 162 lb 3.2 oz (73.6 kg)  Height: 5' 1.75" (1.568 m)    This visit occurred during the  SARS-CoV-2 public health emergency.  Safety protocols were in place, including screening questions prior to the visit, additional usage of staff PPE, and extensive cleaning of exam room while observing appropriate contact time as indicated for disinfecting solutions.   Assessment & Plan:

## 2021-04-04 NOTE — Patient Instructions (Signed)
We have sent in prednisone to take 2 pills daily for 4 days to help this heal faster.   It is okay to take the aleve to help. It is okay to use tylenol also.  We have also sent in a muscle relaxer to take methocarbamol to use up to 3 times a day as needed for pain.

## 2021-04-07 DIAGNOSIS — M545 Low back pain, unspecified: Secondary | ICD-10-CM | POA: Insufficient documentation

## 2021-04-07 NOTE — Assessment & Plan Note (Signed)
She requests labs since she is here today. Ordered HgA1c.

## 2021-04-07 NOTE — Assessment & Plan Note (Signed)
Rx prednisone and methocarbamol. Encouraged to continue using aleve and cold to help with pain first before methocarbamol and discussed risk of sedation with this.

## 2021-04-18 ENCOUNTER — Telehealth: Payer: Self-pay | Admitting: Internal Medicine

## 2021-04-18 DIAGNOSIS — E118 Type 2 diabetes mellitus with unspecified complications: Secondary | ICD-10-CM

## 2021-04-18 DIAGNOSIS — E785 Hyperlipidemia, unspecified: Secondary | ICD-10-CM

## 2021-04-18 NOTE — Progress Notes (Signed)
  Chronic Care Management   Outreach Note  04/18/2021 Name: Sydney Avila MRN: [id number] DOB: 24-Dec-1945  Referred by: Janith Lima, MD Reason for referral : No chief complaint on file.   An unsuccessful telephone outreach was attempted today. The patient was referred to the pharmacist for assistance with care management and care coordination.   Follow Up Plan:   Lauretta Grill Upstream Scheduler

## 2021-04-18 NOTE — Chronic Care Management (AMB) (Signed)
  Chronic Care Management   Note  04/18/2021 Name: BATHSHEBA DURRETT MRN: [id number] DOB: Oct 20, 1945  TARENA GOCKLEY is a 75 y.o. year old female who is a primary care patient of Janith Lima, MD. I reached out to [name] by phone today in response to a referral sent by Ms. Melrose PCP, Janith Lima, MD.   Ms. Rudder was given information about Chronic Care Management services today including:  CCM service includes personalized support from designated clinical staff supervised by her physician, including individualized plan of care and coordination with other care providers 24/7 contact phone numbers for assistance for urgent and routine care needs. Service will only be billed when office clinical staff spend 20 minutes or more in a month to coordinate care. Only one practitioner may furnish and bill the service in a calendar month. The patient may stop CCM services at any time (effective at the end of the month) by phone call to the office staff.   Patient wishes to consider information provided and/or speak with a member of the care team before deciding about enrollment in care management services.   Follow up plan:   Lauretta Grill Upstream Scheduler

## 2021-04-22 NOTE — Addendum Note (Signed)
Addended by: Charlton Haws on: 04/22/2021 04:48 PM   Modules accepted: Orders

## 2021-04-27 ENCOUNTER — Other Ambulatory Visit: Payer: Self-pay

## 2021-04-27 ENCOUNTER — Ambulatory Visit (INDEPENDENT_AMBULATORY_CARE_PROVIDER_SITE_OTHER): Payer: PPO | Admitting: Internal Medicine

## 2021-04-27 VITALS — BP 118/80 | HR 67 | Temp 98.1°F | Ht 61.75 in | Wt 162.0 lb

## 2021-04-27 DIAGNOSIS — H60502 Unspecified acute noninfective otitis externa, left ear: Secondary | ICD-10-CM | POA: Diagnosis not present

## 2021-04-27 DIAGNOSIS — L299 Pruritus, unspecified: Secondary | ICD-10-CM | POA: Diagnosis not present

## 2021-04-27 DIAGNOSIS — I10 Essential (primary) hypertension: Secondary | ICD-10-CM

## 2021-04-27 DIAGNOSIS — Z23 Encounter for immunization: Secondary | ICD-10-CM

## 2021-04-27 MED ORDER — NEOMYCIN-POLYMYXIN-HC 3.5-10000-1 OT SOLN: 4.0000 [drp] | Freq: Four times a day (QID) | OTIC | 0 refills | Status: AC

## 2021-04-27 MED ORDER — TRIAMCINOLONE ACETONIDE 0.1 % EX CREA: 1.0000 "application " | TOPICAL_CREAM | Freq: Two times a day (BID) | CUTANEOUS | 0 refills | Status: AC

## 2021-04-27 MED ORDER — ZOSTER VAC RECOMB ADJUVANTED 50 MCG/0.5ML IM SUSR: 0.5000 mL | Freq: Once | INTRAMUSCULAR | 1 refills | Status: AC

## 2021-04-27 NOTE — Patient Instructions (Addendum)
Ok to be on the lookout for the Novavax covid vaccine that hopefully will arrive about October 2022  Please take all new medication as prescribed - the ear drop antibiotic  Please take all new medication as prescribed  - the steroid cream as needed for itching after the antibiotic  You are given the script for the Shingrix Vaccine #1 today  Please return in 2 months (or shortly after) for the Shingrix Vaccine #2 (with a Nurse Visit)  Please continue all other medications as before, and refills have been done if requested.  Please have the pharmacy call with any other refills you may need.  Please keep your appointments with your specialists as you may have planned

## 2021-04-27 NOTE — Progress Notes (Signed)
Patient ID: Sydney Avila, female   DOB: 12/29/1945, 75 y.o.   MRN: [id number]        Chief Complaint: follow up with c/o left ear pain, itching, asking for shingrix, htn       HPI:  Sydney Avila is a 75 y.o. female here with c/o 3 days onset left ear pain and d/c with slight blood and swelling yesterday, without HA, fever, chills, sinus congestion, ST, cough and Pt denies chest pain, increased sob or doe, wheezing, orthopnea, PND, increased LE swelling, palpitations, dizziness or syncope.  Does also have itching ongoing to the left ear canal and maybe scratched it prior to the infeciton?  Also asks for shingrix today.  Pt denies polydipsia, polyuria, or new focal neuro s/s.        Wt Readings from Last 3 Encounters:  04/27/21 162 lb (73.5 kg)  04/04/21 162 lb 3.2 oz (73.6 kg)  11/10/20 164 lb (74.4 kg)   BP Readings from Last 3 Encounters:  04/27/21 118/80  04/04/21 128/70  11/10/20 126/76         Past Medical History:  Diagnosis Date   Anxiety state, unspecified    Chest pain, unspecified    Fibroid    HTN (hypertension)    Hypercholesterolemia    Obesity    Type II or unspecified type diabetes mellitus without mention of complication, not stated as uncontrolled    Unspecified hemorrhagic conditions    Unspecified menopausal and postmenopausal disorder    Past Surgical History:  Procedure Laterality Date   ABDOMINAL HYSTERECTOMY  1983   TAH BSO   BREAST BIOPSY Left    CATARACT EXTRACTION, BILATERAL     CHOLECYSTECTOMY     COLONOSCOPY  04/16/2014   EYE SURGERY  05/19/2014   FOOT SURGERY     MOUTH SURGERY     OOPHORECTOMY     BSO   OVARIAN CYST SURGERY     SALPINGOOPHORECTOMY      reports that she has quit smoking. She has never used smokeless tobacco. She reports that she does not drink alcohol and does not use drugs. family history includes Diabetes in her father, maternal grandfather, and maternal grandmother; Heart disease in her father; Uterine cancer in her  mother. Allergies  Allergen Reactions   Amoxicillin     Upset GI per pt account   Gabapentin Itching   Jardiance [Empagliflozin]     Hair loss and rash reported by patient   Sulfa Antibiotics    Current Outpatient Medications on File Prior to Visit  Medication Sig Dispense Refill   ALPRAZolam (XANAX) 0.5 MG tablet Take 1 tablet (0.5 mg total) by mouth 2 (two) times daily as needed for anxiety. 45 tablet 3   cholestyramine (QUESTRAN) 4 g packet TAKE ONE PACKET BY MOUTH THREE TIMES A DAY WITH MEALS 90 each 5   Crisaborole (EUCRISA) 2 % OINT Apply 1 Act topically 2 (two) times a day. 100 g 3   Dulaglutide (TRULICITY) 1.82 XH/3.7JI SOPN Inject 1 Act into the skin once a week. 12 pen 1   Lancets (ONETOUCH DELICA PLUS [id number]) MISC Use to check blood sugars twice daily. DX: E11.9 200 each 3   lisinopril (ZESTRIL) 20 MG tablet TAKE 1 TABLET BY MOUTH DAILY 90 tablet 1   metFORMIN (GLUCOPHAGE-XR) 500 MG 24 hr tablet TAKE TWO TABLETS BY MOUTH TWICE A DAY WITH FOOD 360 tablet 1   methocarbamol (ROBAXIN) 500 MG tablet Take 1 tablet (500 mg total) by  mouth every 8 (eight) hours as needed for muscle spasms. 30 tablet 0   simvastatin (ZOCOR) 10 MG tablet Take 1 tablet (10 mg total) by mouth at bedtime. 90 tablet 1   No current facility-administered medications on file prior to visit.        ROS:  All others reviewed and negative.  Objective        PE:  BP 118/80 (BP Location: Left Arm, Patient Position: Sitting, Cuff Size: Normal)   Pulse 67   Temp 98.1 F (36.7 C) (Oral)   Ht 5' 1.75" (1.568 m)   Wt 162 lb (73.5 kg)   SpO2 96%   BMI 29.87 kg/m                 Constitutional: Pt appears in NAD               HENT: Head: NCAT.                Right Ear: External ear normal.                 Left Ear: External ear with 1+ red, tender, swelling but without overt d/c               Eyes: . Pupils are equal, round, and reactive to light. Conjunctivae and EOM are normal               Nose:  without d/c or deformity               Neck: Neck supple. Gross normal ROM               Cardiovascular: Normal rate and regular rhythm.                 Pulmonary/Chest: Effort normal and breath sounds without rales or wheezing.                               Neurological: Pt is alert. At baseline orientation, motor grossly intact               Skin: Skin is warm. No rashes, no other new lesions, LE edema - none               Psychiatric: Pt behavior is normal without agitation   Micro: none  Cardiac tracings I have personally interpreted today:  none  Pertinent Radiological findings (summarize): none   Lab Results  Component Value Date   WBC 7.5 06/02/2020   HGB 13.8 06/02/2020   HCT 40.9 06/02/2020   PLT 225 06/02/2020   GLUCOSE 161 (H) 11/10/2020   CHOL 117 11/10/2020   TRIG 276.0 (H) 11/10/2020   HDL 42.80 11/10/2020   LDLDIRECT 50.0 11/10/2020   LDLCALC 56 06/02/2020   ALT 13 06/02/2020   AST 15 06/02/2020   NA 139 11/10/2020   K 4.0 11/10/2020   CL 104 11/10/2020   CREATININE 0.89 11/10/2020   BUN 14 11/10/2020   CO2 27 11/10/2020   TSH 2.50 06/02/2020   HGBA1C 7.4 (H) 04/04/2021   MICROALBUR 0.2 06/02/2020   Assessment/Plan:  Sydney Avila is a 75 y.o. White or Caucasian [1] female with  has a past medical history of Anxiety state, unspecified, Chest pain, unspecified, Fibroid, HTN (hypertension), Hypercholesterolemia, Obesity, Type II or unspecified type diabetes mellitus without mention of complication, not stated as uncontrolled, Unspecified hemorrhagic conditions, and Unspecified menopausal and  postmenopausal disorder.  Pruritus For topical triam cr prn itching  Need for shingles vaccine For shingrix #1 today, and #2 in 2 months  External otitis of left ear Mild to mod, for antibx course,  to f/u any worsening symptoms or concerns  Essential hypertension, benign BP Readings from Last 3 Encounters:  04/27/21 118/80  04/04/21 128/70  11/10/20 126/76    Stable, pt to continue medical treatment lisinopril  Followup: Return if symptoms worsen or fail to improve.  Sydney Cower, MD 05/01/2021 4:51 PM [facility]

## 2021-05-01 ENCOUNTER — Encounter: Payer: Self-pay | Admitting: Internal Medicine

## 2021-05-01 DIAGNOSIS — L299 Pruritus, unspecified: Secondary | ICD-10-CM | POA: Insufficient documentation

## 2021-05-01 DIAGNOSIS — H6092 Unspecified otitis externa, left ear: Secondary | ICD-10-CM | POA: Insufficient documentation

## 2021-05-01 NOTE — Assessment & Plan Note (Signed)
Mild to mod, for antibx course,  to f/u any worsening symptoms or concerns 

## 2021-05-01 NOTE — Assessment & Plan Note (Signed)
For topical triam cr prn itching

## 2021-05-01 NOTE — Assessment & Plan Note (Signed)
BP Readings from Last 3 Encounters:  04/27/21 118/80  04/04/21 128/70  11/10/20 126/76   Stable, pt to continue medical treatment lisinopril

## 2021-05-01 NOTE — Assessment & Plan Note (Signed)
For shingrix #1 today, and #2 in 2 months

## 2021-05-02 ENCOUNTER — Ambulatory Visit: Payer: PPO | Admitting: Internal Medicine

## 2021-05-03 ENCOUNTER — Telehealth: Payer: Self-pay | Admitting: Internal Medicine

## 2021-05-03 NOTE — Chronic Care Management (AMB) (Signed)
  Chronic Care Management   Note  05/03/2021 Name: DAMIEN SCHEPIS MRN: [id number] DOB: 12/06/1945  KYLIEE CONNELLEY is a 75 y.o. year old female who is a primary care patient of Janith Lima, MD. I reached out to [name] by phone today in response to a referral sent by Ms. Brandon PCP, Janith Lima, MD.   Ms. Longtin was given information about Chronic Care Management services today including:  CCM service includes personalized support from designated clinical staff supervised by her physician, including individualized plan of care and coordination with other care providers 24/7 contact phone numbers for assistance for urgent and routine care needs. Service will only be billed when office clinical staff spend 20 minutes or more in a month to coordinate care. Only one practitioner may furnish and bill the service in a calendar month. The patient may stop CCM services at any time (effective at the end of the month) by phone call to the office staff.   Patient did not agree to enrollment in care management services and does not wish to consider at this time.  Follow up plan:   Lauretta Grill [employer] Scheduler

## 2021-05-22 ENCOUNTER — Other Ambulatory Visit: Payer: Self-pay | Admitting: Internal Medicine

## 2021-05-22 DIAGNOSIS — E118 Type 2 diabetes mellitus with unspecified complications: Secondary | ICD-10-CM

## 2021-05-22 DIAGNOSIS — I10 Essential (primary) hypertension: Secondary | ICD-10-CM

## 2021-05-24 ENCOUNTER — Ambulatory Visit: Payer: PPO | Admitting: Internal Medicine

## 2021-06-03 ENCOUNTER — Other Ambulatory Visit: Payer: Self-pay | Admitting: Internal Medicine

## 2021-06-03 ENCOUNTER — Telehealth: Payer: Self-pay

## 2021-06-03 DIAGNOSIS — E118 Type 2 diabetes mellitus with unspecified complications: Secondary | ICD-10-CM

## 2021-06-03 MED ORDER — METFORMIN HCL ER 500 MG PO TB24: 1000.0000 mg | ORAL_TABLET | Freq: Two times a day (BID) | ORAL | 0 refills | Status: DC

## 2021-06-06 ENCOUNTER — Other Ambulatory Visit (HOSPITAL_BASED_OUTPATIENT_CLINIC_OR_DEPARTMENT_OTHER): Payer: Self-pay | Admitting: Internal Medicine

## 2021-06-06 DIAGNOSIS — Z1231 Encounter for screening mammogram for malignant neoplasm of breast: Secondary | ICD-10-CM

## 2021-06-11 ENCOUNTER — Other Ambulatory Visit: Payer: Self-pay | Admitting: Internal Medicine

## 2021-06-11 DIAGNOSIS — F411 Generalized anxiety disorder: Secondary | ICD-10-CM

## 2021-07-25 DIAGNOSIS — L820 Inflamed seborrheic keratosis: Secondary | ICD-10-CM | POA: Diagnosis not present

## 2021-07-25 DIAGNOSIS — L821 Other seborrheic keratosis: Secondary | ICD-10-CM | POA: Diagnosis not present

## 2021-07-25 DIAGNOSIS — L308 Other specified dermatitis: Secondary | ICD-10-CM | POA: Diagnosis not present

## 2021-07-26 ENCOUNTER — Ambulatory Visit (HOSPITAL_BASED_OUTPATIENT_CLINIC_OR_DEPARTMENT_OTHER): Payer: PPO

## 2021-08-24 ENCOUNTER — Other Ambulatory Visit: Payer: Self-pay | Admitting: Internal Medicine

## 2021-08-24 DIAGNOSIS — F411 Generalized anxiety disorder: Secondary | ICD-10-CM

## 2021-09-06 ENCOUNTER — Other Ambulatory Visit: Payer: Self-pay

## 2021-09-06 ENCOUNTER — Ambulatory Visit (HOSPITAL_BASED_OUTPATIENT_CLINIC_OR_DEPARTMENT_OTHER)
Admission: RE | Admit: 2021-09-06 | Discharge: 2021-09-06 | Disposition: A | Discharge status: Home / Self Care | Payer: PPO | Source: Ambulatory Visit | Attending: Internal Medicine | Admitting: Internal Medicine

## 2021-09-06 DIAGNOSIS — Z1231 Encounter for screening mammogram for malignant neoplasm of breast: Secondary | ICD-10-CM | POA: Insufficient documentation

## 2021-09-07 ENCOUNTER — Other Ambulatory Visit: Payer: Self-pay | Admitting: Internal Medicine

## 2021-09-07 DIAGNOSIS — K9089 Other intestinal malabsorption: Secondary | ICD-10-CM

## 2021-09-29 ENCOUNTER — Ambulatory Visit (INDEPENDENT_AMBULATORY_CARE_PROVIDER_SITE_OTHER): Payer: PPO | Admitting: Internal Medicine

## 2021-09-29 ENCOUNTER — Encounter: Payer: Self-pay | Admitting: Internal Medicine

## 2021-09-29 ENCOUNTER — Other Ambulatory Visit: Payer: Self-pay

## 2021-09-29 VITALS — BP 158/62 | HR 81 | Temp 98.1°F | Ht 61.75 in | Wt 160.0 lb

## 2021-09-29 DIAGNOSIS — F411 Generalized anxiety disorder: Secondary | ICD-10-CM | POA: Diagnosis not present

## 2021-09-29 DIAGNOSIS — E118 Type 2 diabetes mellitus with unspecified complications: Secondary | ICD-10-CM

## 2021-09-29 DIAGNOSIS — F321 Major depressive disorder, single episode, moderate: Secondary | ICD-10-CM

## 2021-09-29 DIAGNOSIS — H608X2 Other otitis externa, left ear: Secondary | ICD-10-CM | POA: Diagnosis not present

## 2021-09-29 DIAGNOSIS — E785 Hyperlipidemia, unspecified: Secondary | ICD-10-CM | POA: Diagnosis not present

## 2021-09-29 DIAGNOSIS — Z0001 Encounter for general adult medical examination with abnormal findings: Secondary | ICD-10-CM | POA: Diagnosis not present

## 2021-09-29 DIAGNOSIS — Z23 Encounter for immunization: Secondary | ICD-10-CM | POA: Insufficient documentation

## 2021-09-29 DIAGNOSIS — I10 Essential (primary) hypertension: Secondary | ICD-10-CM

## 2021-09-29 LAB — BASIC METABOLIC PANEL
BUN: 17 mg/dL (ref 6–23)
CO2: 28 mEq/L (ref 19–32)
Calcium: 9.3 mg/dL (ref 8.4–10.5)
Chloride: 103 mEq/L (ref 96–112)
Creatinine, Ser: 0.84 mg/dL (ref 0.40–1.20)
GFR: 66.84 mL/min (ref >60.00)
Glucose, Bld: 143 mg/dL — ABNORMAL HIGH (ref 70–99)
Potassium: 4 mEq/L (ref 3.5–5.1)
Sodium: 140 mEq/L (ref 135–145)

## 2021-09-29 LAB — LIPID PANEL
Cholesterol: 126 mg/dL (ref 0–200)
HDL: 48.2 mg/dL (ref >39.00)
NonHDL: 77.9
Total CHOL/HDL Ratio: 3
Triglycerides: 225 mg/dL — ABNORMAL HIGH (ref 0.0–149.0)
VLDL: 45 mg/dL — ABNORMAL HIGH (ref 0.0–40.0)

## 2021-09-29 LAB — URINALYSIS, ROUTINE W REFLEX MICROSCOPIC
Bilirubin Urine: NEGATIVE
Hgb urine dipstick: NEGATIVE
Ketones, ur: NEGATIVE
Leukocytes,Ua: NEGATIVE
Nitrite: NEGATIVE
RBC / HPF: NONE SEEN (ref 0–?)
Specific Gravity, Urine: <=1.005 — AB (ref 1.000–1.030)
Total Protein, Urine: NEGATIVE
Urine Glucose: NEGATIVE
Urobilinogen, UA: 0.2 (ref 0.0–1.0)
pH: 5.5 (ref 5.0–8.0)

## 2021-09-29 LAB — MICROALBUMIN / CREATININE URINE RATIO
Creatinine,U: 20.4 mg/dL
Microalb Creat Ratio: 3.4 mg/g (ref 0.0–30.0)
Microalb, Ur: <0.7 mg/dL (ref 0.0–1.9)

## 2021-09-29 LAB — HEPATIC FUNCTION PANEL
ALT: 12 U/L (ref 0–35)
AST: 14 U/L (ref 0–37)
Albumin: 4.4 g/dL (ref 3.5–5.2)
Alkaline Phosphatase: 64 U/L (ref 39–117)
Bilirubin, Direct: 0.2 mg/dL (ref 0.0–0.3)
Total Bilirubin: 1.2 mg/dL (ref 0.2–1.2)
Total Protein: 6.8 g/dL (ref 6.0–8.3)

## 2021-09-29 LAB — CBC WITH DIFFERENTIAL/PLATELET
Basophils Absolute: 0 10*3/uL (ref 0.0–0.1)
Basophils Relative: 0.6 % (ref 0.0–3.0)
Eosinophils Absolute: 0.1 10*3/uL (ref 0.0–0.7)
Eosinophils Relative: 1.3 % (ref 0.0–5.0)
HCT: 40.4 % (ref 36.0–46.0)
Hemoglobin: 13.4 g/dL (ref 12.0–15.0)
Lymphocytes Relative: 32.5 % (ref 12.0–46.0)
Lymphs Abs: 2.2 10*3/uL (ref 0.7–4.0)
MCHC: 33.1 g/dL (ref 30.0–36.0)
MCV: 93.3 fl (ref 78.0–100.0)
Monocytes Absolute: 0.4 10*3/uL (ref 0.1–1.0)
Monocytes Relative: 6.6 % (ref 3.0–12.0)
Neutro Abs: 4 10*3/uL (ref 1.4–7.7)
Neutrophils Relative %: 59 % (ref 43.0–77.0)
Platelets: 214 10*3/uL (ref 150.0–400.0)
RBC: 4.33 Mil/uL (ref 3.87–5.11)
RDW: 13.6 % (ref 11.5–15.5)
WBC: 6.7 10*3/uL (ref 4.0–10.5)

## 2021-09-29 LAB — LDL CHOLESTEROL, DIRECT: Direct LDL: 61 mg/dL

## 2021-09-29 LAB — TSH: TSH: 2.16 u[IU]/mL (ref 0.35–5.50)

## 2021-09-29 LAB — HEMOGLOBIN A1C: Hgb A1c MFr Bld: 7.5 % — ABNORMAL HIGH (ref 4.6–6.5)

## 2021-09-29 MED ORDER — ALPRAZOLAM 0.5 MG PO TABS: ORAL_TABLET | ORAL | 3 refills | Status: DC

## 2021-09-29 MED ORDER — TRULICITY 1.5 MG/0.5ML ~~LOC~~ SOAJ: 1.5000 mg | SUBCUTANEOUS | 0 refills | Status: DC

## 2021-09-29 MED ORDER — SIMVASTATIN 10 MG PO TABS
10.0000 mg | ORAL_TABLET | Freq: Every day | ORAL | 1 refills | Status: DC
Start: 2021-09-29 — End: 2021-10-05

## 2021-09-29 MED ORDER — LISINOPRIL 20 MG PO TABS
20.0000 mg | ORAL_TABLET | Freq: Every day | ORAL | 1 refills | Status: DC
Start: 2021-09-29 — End: 2022-02-22

## 2021-09-29 MED ORDER — BOOSTRIX 5-2.5-18.5 LF-MCG/0.5 IM SUSP: 0.5000 mL | Freq: Once | INTRAMUSCULAR | 0 refills | Status: AC

## 2021-09-29 MED ORDER — NEOMYCIN-POLYMYXIN-HC 1 % OT SOLN: 3.0000 [drp] | Freq: Three times a day (TID) | OTIC | 0 refills | Status: DC

## 2021-09-29 MED ORDER — VILAZODONE HCL 10 MG PO TABS: 10.0000 mg | ORAL_TABLET | Freq: Every day | ORAL | 0 refills | Status: DC

## 2021-09-29 MED ORDER — METFORMIN HCL ER 500 MG PO TB24
1000.0000 mg | ORAL_TABLET | Freq: Two times a day (BID) | ORAL | 1 refills | Status: DC
Start: 2021-09-29 — End: 2022-04-19

## 2021-09-29 NOTE — Patient Instructions (Signed)

## 2021-09-29 NOTE — Progress Notes (Signed)
Subjective:  Patient ID: Sydney Avila, female    DOB: [birth year]  Age: 75 y.o. MRN: [id number]  CC: Annual Exam, Depression, Hypertension, Diabetes, and Hyperlipidemia  This visit occurred during the SARS-CoV-2 public health emergency.  Safety protocols were in place, including screening questions prior to the visit, additional usage of staff PPE, and extensive cleaning of exam room while observing appropriate contact time as indicated for disinfecting solutions.    HPI Sydney Avila presents for a CPX and f/up -   She is active and denies chest pain, shortness of breath, diaphoresis, palpitations, edema, fatigue.  She complains of a chronic sensation of itching in her left ear.  She feels stressed and overwhelmed.  She has episodes of anxiety and crying spells.  She denies SI or HI.  Outpatient Medications Prior to Visit  Medication Sig Dispense Refill   cholestyramine (QUESTRAN) 4 g packet TAKE 1 PACKET BY MOUTH THREE TIMES A DAY WITH MEALS 90 packet 3   Lancets (ONETOUCH DELICA PLUS [id number]) MISC Use to check blood sugars twice daily. DX: E11.9 200 each 3   methocarbamol (ROBAXIN) 500 MG tablet Take 1 tablet (500 mg total) by mouth every 8 (eight) hours as needed for muscle spasms. 30 tablet 0   triamcinolone cream (KENALOG) 0.1 % Apply 1 application topically 2 (two) times daily. 30 g 0   ALPRAZolam (XANAX) 0.5 MG tablet TAKE ONE TABLET BY MOUTH TWICE A DAY AS NEEDED FOR ANXIETY 45 tablet 0   Dulaglutide (TRULICITY) 2.13 YQ/6.5HQ SOPN Inject 1 Act into the skin once a week. 12 pen 1   lisinopril (ZESTRIL) 20 MG tablet TAKE ONE TABLET BY MOUTH DAILY 90 tablet 0   metFORMIN (GLUCOPHAGE-XR) 500 MG 24 hr tablet Take 2 tablets (1,000 mg total) by mouth 2 (two) times daily. 360 tablet 0   simvastatin (ZOCOR) 10 MG tablet Take 1 tablet (10 mg total) by mouth at bedtime. 90 tablet 1   Crisaborole (EUCRISA) 2 % OINT Apply 1 Act topically 2 (two) times a day. 100 g 3   No  facility-administered medications prior to visit.    ROS Review of Systems  Constitutional:  Negative for chills, diaphoresis, fatigue and fever.  HENT: Negative.  Negative for ear discharge, ear pain, hearing loss and sinus pressure.   Eyes: Negative.   Respiratory:  Negative for cough, chest tightness, shortness of breath and wheezing.   Cardiovascular:  Negative for chest pain, palpitations and leg swelling.  Gastrointestinal:  Negative for abdominal pain, constipation, diarrhea, nausea and vomiting.  Endocrine: Negative.   Genitourinary: Negative.  Negative for difficulty urinating.  Musculoskeletal: Negative.   Skin: Negative.   Neurological: Negative.  Negative for dizziness, weakness, light-headedness and headaches.  Hematological:  Negative for adenopathy. Does not bruise/bleed easily.  Psychiatric/Behavioral:  Positive for dysphoric mood. Negative for behavioral problems, confusion, decreased concentration, hallucinations, self-injury, sleep disturbance and suicidal ideas. The patient is nervous/anxious. The patient is not hyperactive.    Objective:  BP (!) 158/62 (BP Location: Right Arm, Patient Position: Sitting, Cuff Size: Large)    Pulse 81    Temp 98.1 F (36.7 C) (Oral)    Ht 5' 1.75" (1.568 m)    Wt 160 lb (72.6 kg)    SpO2 96%    BMI 29.50 kg/m   BP Readings from Last 3 Encounters:  09/29/21 (!) 158/62  04/27/21 118/80  04/04/21 128/70    Wt Readings from Last 3 Encounters:  09/29/21 160 lb (72.6 kg)  04/27/21 162 lb (73.5 kg)  04/04/21 162 lb 3.2 oz (73.6 kg)    Physical Exam Vitals reviewed.  HENT:     Right Ear: Hearing, tympanic membrane, ear canal and external ear normal.     Left Ear: Hearing, tympanic membrane, ear canal and external ear normal.     Ears:     Comments: ++ flaking in the left EAC    Nose: Nose normal.     Mouth/Throat:     Mouth: Mucous membranes are moist.     Pharynx: No oropharyngeal exudate.  Eyes:     General: No scleral  icterus.    Conjunctiva/sclera: Conjunctivae normal.  Cardiovascular:     Rate and Rhythm: Normal rate and regular rhythm.     Heart sounds: No murmur heard. Pulmonary:     Effort: Pulmonary effort is normal.     Breath sounds: No stridor. No wheezing, rhonchi or rales.  Abdominal:     General: Abdomen is flat.     Palpations: There is no mass.     Tenderness: There is no abdominal tenderness. There is no guarding.     Hernia: No hernia is present.  Musculoskeletal:        General: Normal range of motion.     Cervical back: Neck supple.     Right lower leg: No edema.     Left lower leg: No edema.  Skin:    General: Skin is warm and dry.  Neurological:     General: No focal deficit present.     Mental Status: She is alert.  Psychiatric:        Attention and Perception: She is inattentive.        Mood and Affect: Mood is anxious and depressed. Affect is angry and tearful. Affect is not labile or flat.        Speech: Speech normal. She is communicative. Speech is not rapid and pressured, delayed, slurred or tangential.        Behavior: Behavior normal. Behavior is not aggressive. Behavior is cooperative.        Thought Content: Thought content normal. Thought content is not paranoid or delusional. Thought content does not include homicidal or suicidal ideation.        Cognition and Memory: Cognition normal.    Lab Results  Component Value Date   WBC 6.7 09/29/2021   HGB 13.4 09/29/2021   HCT 40.4 09/29/2021   PLT 214.0 09/29/2021   GLUCOSE 143 (H) 09/29/2021   CHOL 126 09/29/2021   TRIG 225.0 (H) 09/29/2021   HDL 48.20 09/29/2021   LDLDIRECT 61.0 09/29/2021   LDLCALC 56 06/02/2020   ALT 12 09/29/2021   AST 14 09/29/2021   NA 140 09/29/2021   K 4.0 09/29/2021   CL 103 09/29/2021   CREATININE 0.84 09/29/2021   BUN 17 09/29/2021   CO2 28 09/29/2021   TSH 2.16 09/29/2021   HGBA1C 7.5 (H) 09/29/2021   MICROALBUR <0.7 09/29/2021    MM 3D SCREEN BREAST  BILATERAL  Result Date: 09/06/2021 CLINICAL DATA:  Screening. EXAM: DIGITAL SCREENING BILATERAL MAMMOGRAM WITH TOMOSYNTHESIS AND CAD TECHNIQUE: Bilateral screening digital craniocaudal and mediolateral oblique mammograms were obtained. Bilateral screening digital breast tomosynthesis was performed. The images were evaluated with computer-aided detection. COMPARISON:  Previous exam(s). ACR Breast Density Category c: The breast tissue is heterogeneously dense, which may obscure small masses. FINDINGS: There are no findings suspicious for malignancy. IMPRESSION: No mammographic evidence of malignancy. A result letter of this  screening mammogram will be mailed directly to the patient. RECOMMENDATION: Screening mammogram in one year. (Code:SM-B-01Y) BI-RADS CATEGORY  1: Negative. Electronically Signed   By: Lajean Manes M.D.   On: 09/06/2021 14:32    Assessment & Plan:   Inge was seen today for annual exam, depression, hypertension, diabetes and hyperlipidemia.  Diagnoses and all orders for this visit:  Essential hypertension, benign- Her blood pressure is adequately well controlled. -     CBC with Differential/Platelet; Future -     Basic metabolic panel; Future -     Hepatic function panel; Future -     TSH; Future -     Urinalysis, Routine w reflex microscopic; Future -     lisinopril (ZESTRIL) 20 MG tablet; Take 1 tablet (20 mg total) by mouth daily. -     Urinalysis, Routine w reflex microscopic -     TSH -     Hepatic function panel -     Basic metabolic panel -     CBC with Differential/Platelet  Type II diabetes mellitus with manifestations ([place])- Her A1c is at 7.5%.  I recommend she gradually increase the dose of the GLP-1 agonist. -     Basic metabolic panel; Future -     Microalbumin / creatinine urine ratio; Future -     Hemoglobin A1c; Future -     lisinopril (ZESTRIL) 20 MG tablet; Take 1 tablet (20 mg total) by mouth daily. -     metFORMIN (GLUCOPHAGE-XR) 500 MG 24 hr tablet;  Take 2 tablets (1,000 mg total) by mouth 2 (two) times daily. -     Hemoglobin A1c -     Microalbumin / creatinine urine ratio -     Basic metabolic panel -     Dulaglutide (TRULICITY) 1.5 BD/5.3GD SOPN; Inject 1.5 mg into the skin once a week.  Hyperlipidemia with target LDL less than 100- LDL goal achieved. Doing well on the statin  -     Lipid panel; Future -     Hepatic function panel; Future -     TSH; Future -     simvastatin (ZOCOR) 10 MG tablet; Take 1 tablet (10 mg total) by mouth at bedtime. -     TSH -     Hepatic function panel -     Lipid panel  GAD (generalized anxiety disorder) -     ALPRAZolam (XANAX) 0.5 MG tablet; TAKE ONE TABLET BY MOUTH TWICE A DAY AS NEEDED FOR ANXIETY Strength: 0.5 mg  Type 2 diabetes mellitus with complication, without long-term current use of insulin (HCC) -     lisinopril (ZESTRIL) 20 MG tablet; Take 1 tablet (20 mg total) by mouth daily. -     metFORMIN (GLUCOPHAGE-XR) 500 MG 24 hr tablet; Take 2 tablets (1,000 mg total) by mouth 2 (two) times daily. -     Dulaglutide (TRULICITY) 1.5 JM/4.2AS SOPN; Inject 1.5 mg into the skin once a week.  Chronic eczematoid otitis externa of left ear -     NEOMYCIN-POLYMYXIN-HYDROCORTISONE (CORTISPORIN) 1 % SOLN OTIC solution; Place 3 drops into the left ear every 8 (eight) hours.  Need for prophylactic vaccination with combined diphtheria-tetanus-pertussis (DTP) vaccine -     Tdap (BOOSTRIX) 5-2.5-18.5 LF-MCG/0.5 injection; Inject 0.5 mLs into the muscle once for 1 dose.  Current moderate episode of major depressive disorder without prior episode ([facility])- I recommended that she start vilazodone.  Will slowly increase the dose over time -     Vilazodone  HCl (VIIBRYD) 10 MG TABS; Take 1 tablet (10 mg total) by mouth daily for 7 days.  Encounter for general adult medical examination with abnormal findings- Exam completed, labs reviewed, vaccines reviewed and updated, no cancer screenings indicated, patient  education was given.  Other orders -     LDL cholesterol, direct   I have discontinued Lemmie Evens. Zachery "Sue"'s Eucrisa and Trulicity. I have also changed her ALPRAZolam and lisinopril. Additionally, I am having her start on Boostrix, NEOMYCIN-POLYMYXIN-HYDROCORTISONE, Vilazodone HCl, and Trulicity. Lastly, I am having her maintain her OneTouch Delica Plus [id number], methocarbamol, triamcinolone cream, cholestyramine, metFORMIN, and simvastatin.  Meds ordered this encounter  Medications   ALPRAZolam (XANAX) 0.5 MG tablet    Sig: TAKE ONE TABLET BY MOUTH TWICE A DAY AS NEEDED FOR ANXIETY Strength: 0.5 mg    Dispense:  45 tablet    Refill:  3   lisinopril (ZESTRIL) 20 MG tablet    Sig: Take 1 tablet (20 mg total) by mouth daily.    Dispense:  90 tablet    Refill:  1   metFORMIN (GLUCOPHAGE-XR) 500 MG 24 hr tablet    Sig: Take 2 tablets (1,000 mg total) by mouth 2 (two) times daily.    Dispense:  360 tablet    Refill:  1   simvastatin (ZOCOR) 10 MG tablet    Sig: Take 1 tablet (10 mg total) by mouth at bedtime.    Dispense:  90 tablet    Refill:  1   Tdap (BOOSTRIX) 5-2.5-18.5 LF-MCG/0.5 injection    Sig: Inject 0.5 mLs into the muscle once for 1 dose.    Dispense:  0.5 mL    Refill:  0   NEOMYCIN-POLYMYXIN-HYDROCORTISONE (CORTISPORIN) 1 % SOLN OTIC solution    Sig: Place 3 drops into the left ear every 8 (eight) hours.    Dispense:  10 mL    Refill:  0   Vilazodone HCl (VIIBRYD) 10 MG TABS    Sig: Take 1 tablet (10 mg total) by mouth daily for 7 days.    Dispense:  7 tablet    Refill:  0   Dulaglutide (TRULICITY) 1.5 HY/3.8OI SOPN    Sig: Inject 1.5 mg into the skin once a week.    Dispense:  2 mL    Refill:  0     Follow-up: Return in about 6 months (around 03/30/2022).  [name], MD

## 2021-09-30 ENCOUNTER — Other Ambulatory Visit: Payer: Self-pay | Admitting: Internal Medicine

## 2021-09-30 DIAGNOSIS — E118 Type 2 diabetes mellitus with unspecified complications: Secondary | ICD-10-CM

## 2021-09-30 DIAGNOSIS — Z0001 Encounter for general adult medical examination with abnormal findings: Secondary | ICD-10-CM | POA: Insufficient documentation

## 2021-09-30 MED ORDER — TRULICITY 1.5 MG/0.5ML ~~LOC~~ SOAJ: 1.5000 mg | SUBCUTANEOUS | 1 refills | Status: DC

## 2021-10-04 ENCOUNTER — Telehealth: Payer: Self-pay

## 2021-10-04 NOTE — Telephone Encounter (Signed)
[employer] PAP for 2023 has been submitted for trulicity 1.5mg  weekly dose   Follow up in 1 week for status of application   Tomasa Blase, PharmD Clinical Pharmacist, [place]

## 2021-10-04 NOTE — Telephone Encounter (Signed)
Patient states rx Dulaglutide (TRULICITY) 1.5 XK/4.8JE SOPN was sent [facility] of [facility]  Patient requesting [facility]  Patient states she dropped forms off from [employer] [facility] on 09-30-2021  Patient states she received another patient's avs on 12-23, patient states she has mailed the avs back to provider's office  *see below*

## 2021-10-05 ENCOUNTER — Other Ambulatory Visit: Payer: Self-pay | Admitting: Internal Medicine

## 2021-10-05 DIAGNOSIS — E785 Hyperlipidemia, unspecified: Secondary | ICD-10-CM

## 2021-10-14 ENCOUNTER — Telehealth: Payer: Self-pay

## 2021-10-14 NOTE — Telephone Encounter (Signed)
Pt spoke with Mignon Pine, LPN today in regard for her Vilazodone inquiring for refill. Epic secure chat conversation outlined below:  Lisette Abu, LPN:  FYI.Marland KitchenMarland KitchenYour patient came by the office today regarding her immunization record.  I did update her chart with the shingrix & flu dates [facility].  Also the patient wanted to know should she start Vilazodone HCI for depression.  She said that there was only 7 days worth of pills and should she start those and she gets more and how long will it take for her start showing a decline in depression.  CB# [phone].  [name], MD: the depression will improve in the first 1-2 weeks. If all goes well with the 10 mg then she will start 20 mg, if needed, we can go up to 40 mg.  Lisette Abu, LPN: [name] can you let this patient know.  They called me for her but they meant to send it to you.  Thanks  [employer], CMA: Ok will call her now   Pt has been informed of above recommendation per PCP. She stated that she was concerned with the amount of 7 pills being prescribed but I reassured her that we are aware. It would be a gradual increase depending on her symptoms. She expressed understanding.

## 2021-10-19 ENCOUNTER — Telehealth: Payer: Self-pay | Admitting: Internal Medicine

## 2021-10-19 NOTE — Telephone Encounter (Signed)
Ivin Booty from [facility] has called to inform provider of pt behavior when she was contacted to schedule referral appt with their office. States that pt was very dismissive and wanted to know how/ why their office was contacting her. States that pt will probably call our office, upset, although referral was on AVS from last visit.

## 2021-10-31 ENCOUNTER — Telehealth: Payer: Self-pay | Admitting: Internal Medicine

## 2021-10-31 NOTE — Telephone Encounter (Signed)
Patient requesting a call back to discuss the patient assistant program for [employer] [employer] form she dropped off on 09-26-2021

## 2021-10-31 NOTE — Telephone Encounter (Signed)
Pt calling in requesting a update on the [facility] application for Trulicity 1.5mg .   Pt CB [phone].

## 2021-11-01 NOTE — Telephone Encounter (Signed)
Called and spoke with patient   Patient has been approved for [employer] application until 47/84/1282  Patient reports that l[facility] is requiring additional documentation  Will reach out to [employer] [facility]

## 2021-11-01 NOTE — Telephone Encounter (Signed)
Confirmed with [facility] that application has bene approved for the 2023 year - [facility] will be reaching out to patient to set up medication delivery  Patient to call back with any issues or concerns

## 2021-11-14 DIAGNOSIS — Z7984 Long term (current) use of oral hypoglycemic drugs: Secondary | ICD-10-CM | POA: Diagnosis not present

## 2021-11-14 DIAGNOSIS — Z961 Presence of intraocular lens: Secondary | ICD-10-CM | POA: Diagnosis not present

## 2021-11-14 DIAGNOSIS — H524 Presbyopia: Secondary | ICD-10-CM | POA: Diagnosis not present

## 2021-11-14 DIAGNOSIS — E119 Type 2 diabetes mellitus without complications: Secondary | ICD-10-CM | POA: Diagnosis not present

## 2021-11-14 LAB — HM DIABETES EYE EXAM

## 2022-01-31 DIAGNOSIS — D1801 Hemangioma of skin and subcutaneous tissue: Secondary | ICD-10-CM | POA: Diagnosis not present

## 2022-01-31 DIAGNOSIS — L814 Other melanin hyperpigmentation: Secondary | ICD-10-CM | POA: Diagnosis not present

## 2022-01-31 DIAGNOSIS — L820 Inflamed seborrheic keratosis: Secondary | ICD-10-CM | POA: Diagnosis not present

## 2022-01-31 DIAGNOSIS — L821 Other seborrheic keratosis: Secondary | ICD-10-CM | POA: Diagnosis not present

## 2022-02-22 ENCOUNTER — Other Ambulatory Visit: Payer: Self-pay | Admitting: Internal Medicine

## 2022-02-22 DIAGNOSIS — I10 Essential (primary) hypertension: Secondary | ICD-10-CM

## 2022-02-22 DIAGNOSIS — E118 Type 2 diabetes mellitus with unspecified complications: Secondary | ICD-10-CM

## 2022-03-10 ENCOUNTER — Telehealth: Payer: Self-pay

## 2022-03-10 MED ORDER — BLOOD GLUCOSE METER KIT: PACK | 0 refills | Status: AC

## 2022-03-10 NOTE — Telephone Encounter (Signed)
Pt was calling to check up on the status of a new rx for her a glucometer as her has died. She states the pharmacy did send over a request and has stated her meter is from 11/2010 and she is needing a new meter.  **Pt has not check blood sugars in 2 weeks.

## 2022-03-28 DIAGNOSIS — E1151 Type 2 diabetes mellitus with diabetic peripheral angiopathy without gangrene: Secondary | ICD-10-CM | POA: Diagnosis not present

## 2022-03-28 DIAGNOSIS — M2042 Other hammer toe(s) (acquired), left foot: Secondary | ICD-10-CM | POA: Diagnosis not present

## 2022-03-28 DIAGNOSIS — L84 Corns and callosities: Secondary | ICD-10-CM | POA: Diagnosis not present

## 2022-03-28 DIAGNOSIS — I739 Peripheral vascular disease, unspecified: Secondary | ICD-10-CM | POA: Diagnosis not present

## 2022-03-28 DIAGNOSIS — M2041 Other hammer toe(s) (acquired), right foot: Secondary | ICD-10-CM | POA: Diagnosis not present

## 2022-03-28 DIAGNOSIS — M21961 Unspecified acquired deformity of right lower leg: Secondary | ICD-10-CM | POA: Diagnosis not present

## 2022-03-30 ENCOUNTER — Ambulatory Visit: Payer: PPO | Admitting: Internal Medicine

## 2022-04-01 ENCOUNTER — Other Ambulatory Visit: Payer: Self-pay | Admitting: Internal Medicine

## 2022-04-01 DIAGNOSIS — F411 Generalized anxiety disorder: Secondary | ICD-10-CM

## 2022-04-13 ENCOUNTER — Other Ambulatory Visit: Payer: Self-pay | Admitting: Internal Medicine

## 2022-04-13 DIAGNOSIS — E785 Hyperlipidemia, unspecified: Secondary | ICD-10-CM

## 2022-04-18 ENCOUNTER — Ambulatory Visit (INDEPENDENT_AMBULATORY_CARE_PROVIDER_SITE_OTHER): Payer: PPO | Admitting: Internal Medicine

## 2022-04-18 ENCOUNTER — Encounter: Payer: Self-pay | Admitting: Internal Medicine

## 2022-04-18 VITALS — BP 148/84 | HR 82 | Temp 98.0°F | Resp 16 | Ht 62.5 in | Wt 155.0 lb

## 2022-04-18 DIAGNOSIS — E118 Type 2 diabetes mellitus with unspecified complications: Secondary | ICD-10-CM | POA: Diagnosis not present

## 2022-04-18 DIAGNOSIS — E2839 Other primary ovarian failure: Secondary | ICD-10-CM | POA: Diagnosis not present

## 2022-04-18 DIAGNOSIS — Z23 Encounter for immunization: Secondary | ICD-10-CM

## 2022-04-18 DIAGNOSIS — K9089 Other intestinal malabsorption: Secondary | ICD-10-CM

## 2022-04-18 DIAGNOSIS — I10 Essential (primary) hypertension: Secondary | ICD-10-CM

## 2022-04-18 LAB — BASIC METABOLIC PANEL
BUN: 14 mg/dL (ref 6–23)
CO2: 26 mEq/L (ref 19–32)
Calcium: 9.2 mg/dL (ref 8.4–10.5)
Chloride: 104 mEq/L (ref 96–112)
Creatinine, Ser: 0.82 mg/dL (ref 0.40–1.20)
GFR: 68.53 mL/min (ref >60.00)
Glucose, Bld: 121 mg/dL — ABNORMAL HIGH (ref 70–99)
Potassium: 4 mEq/L (ref 3.5–5.1)
Sodium: 139 mEq/L (ref 135–145)

## 2022-04-18 LAB — HEMOGLOBIN A1C: Hgb A1c MFr Bld: 7.2 % — ABNORMAL HIGH (ref 4.6–6.5)

## 2022-04-18 MED ORDER — BOOSTRIX 5-2.5-18.5 LF-MCG/0.5 IM SUSP: 0.5000 mL | Freq: Once | INTRAMUSCULAR | 0 refills | Status: AC

## 2022-04-18 NOTE — Patient Instructions (Signed)

## 2022-04-18 NOTE — Progress Notes (Unsigned)
Subjective:  Patient ID: Sydney Avila, female    DOB: 09-26-1946  Age: 76 y.o. MRN: [id number]  CC: No chief complaint on file.   HPI Sydney Avila presents for ***  Outpatient Medications Prior to Visit  Medication Sig Dispense Refill   ALPRAZolam (XANAX) 0.5 MG tablet TAKE ONE TABLET BY MOUTH TWICE A DAY AS NEEDED FOR ANXIETY 45 tablet 1   blood glucose meter kit and supplies Dispense based on patient and insurance preference. Use up to 2 times daily as directed. (FOR ICD-10 E10.9, E11.9). 1 each 0   cholestyramine (QUESTRAN) 4 g packet TAKE 1 PACKET BY MOUTH THREE TIMES A DAY WITH MEALS 90 packet 3   Dulaglutide (TRULICITY) 1.5 WU/8.8BV SOPN Inject 1.5 mg into the skin once a week. 6 mL 1   Lancets (ONETOUCH DELICA PLUS [id number]) MISC Use to check blood sugars twice daily. DX: E11.9 200 each 3   lisinopril (ZESTRIL) 20 MG tablet TAKE ONE TABLET BY MOUTH DAILY 90 tablet 1   metFORMIN (GLUCOPHAGE-XR) 500 MG 24 hr tablet Take 2 tablets (1,000 mg total) by mouth 2 (two) times daily. 360 tablet 1   methocarbamol (ROBAXIN) 500 MG tablet Take 1 tablet (500 mg total) by mouth every 8 (eight) hours as needed for muscle spasms. 30 tablet 0   NEOMYCIN-POLYMYXIN-HYDROCORTISONE (CORTISPORIN) 1 % SOLN OTIC solution Place 3 drops into the left ear every 8 (eight) hours. 10 mL 0   simvastatin (ZOCOR) 10 MG tablet TAKE ONE TABLET BY MOUTH EVERY NIGHT AT BEDTIME 90 tablet 0   triamcinolone cream (KENALOG) 0.1 % Apply 1 application topically 2 (two) times daily. 30 g 0   Vilazodone HCl (VIIBRYD) 10 MG TABS Take 1 tablet (10 mg total) by mouth daily for 7 days. 7 tablet 0   No facility-administered medications prior to visit.    ROS Review of Systems  Objective:  BP (!) 148/84 (BP Location: Left Arm, Patient Position: Sitting, Cuff Size: Large)   Pulse 82   Temp 98 F (36.7 C) (Oral)   Resp 16   Ht 5' 2.5" (1.588 m)   Wt 155 lb (70.3 kg)   SpO2 97%   BMI 27.90 kg/m   BP Readings from  Last 3 Encounters:  04/18/22 (!) 148/84  09/29/21 (!) 158/62  04/27/21 118/80    Wt Readings from Last 3 Encounters:  04/18/22 155 lb (70.3 kg)  09/29/21 160 lb (72.6 kg)  04/27/21 162 lb (73.5 kg)    Physical Exam  Lab Results  Component Value Date   WBC 6.7 09/29/2021   HGB 13.4 09/29/2021   HCT 40.4 09/29/2021   PLT 214.0 09/29/2021   GLUCOSE 143 (H) 09/29/2021   CHOL 126 09/29/2021   TRIG 225.0 (H) 09/29/2021   HDL 48.20 09/29/2021   LDLDIRECT 61.0 09/29/2021   LDLCALC 56 06/02/2020   ALT 12 09/29/2021   AST 14 09/29/2021   NA 140 09/29/2021   K 4.0 09/29/2021   CL 103 09/29/2021   CREATININE 0.84 09/29/2021   BUN 17 09/29/2021   CO2 28 09/29/2021   TSH 2.16 09/29/2021   HGBA1C 7.5 (H) 09/29/2021   MICROALBUR <0.7 09/29/2021    MM 3D SCREEN BREAST BILATERAL  Result Date: 09/06/2021 CLINICAL DATA:  Screening. EXAM: DIGITAL SCREENING BILATERAL MAMMOGRAM WITH TOMOSYNTHESIS AND CAD TECHNIQUE: Bilateral screening digital craniocaudal and mediolateral oblique mammograms were obtained. Bilateral screening digital breast tomosynthesis was performed. The images were evaluated with computer-aided detection. COMPARISON:  Previous exam(s).  ACR Breast Density Category c: The breast tissue is heterogeneously dense, which may obscure small masses. FINDINGS: There are no findings suspicious for malignancy. IMPRESSION: No mammographic evidence of malignancy. A result letter of this screening mammogram will be mailed directly to the patient. RECOMMENDATION: Screening mammogram in one year. (Code:[id number]-B-01Y) BI-RADS CATEGORY  1: Negative. Electronically Signed   By: Lajean Manes M.D.   On: 09/06/2021 14:32    Assessment & Plan:   Diagnoses and all orders for this visit:  Type 2 diabetes mellitus with complication, without long-term current use of insulin ([place]) -     Basic metabolic panel; Future -     Hemoglobin A1c; Future  Bile salt-induced diarrhea  Estrogen deficiency -      DG Bone Density; Future  Need for prophylactic vaccination with combined diphtheria-tetanus-pertussis (DTP) vaccine -     Tdap (BOOSTRIX) 5-2.5-18.5 LF-MCG/0.5 injection; Inject 0.5 mLs into the muscle once for 1 dose.   I am having Sydney Avila "Sydney Avila" start on Boostrix. I am also having her maintain her OneTouch Delica Plus [id number], methocarbamol, triamcinolone cream, cholestyramine, metFORMIN, NEOMYCIN-POLYMYXIN-HYDROCORTISONE, Vilazodone HCl, Trulicity, lisinopril, blood glucose meter kit and supplies, ALPRAZolam, and simvastatin.  Meds ordered this encounter  Medications   Tdap (BOOSTRIX) 5-2.5-18.5 LF-MCG/0.5 injection    Sig: Inject 0.5 mLs into the muscle once for 1 dose.    Dispense:  0.5 mL    Refill:  0     Follow-up: No follow-ups on file.  Sydney Calico, MD

## 2022-04-19 ENCOUNTER — Other Ambulatory Visit: Payer: Self-pay | Admitting: Internal Medicine

## 2022-04-19 DIAGNOSIS — E118 Type 2 diabetes mellitus with unspecified complications: Secondary | ICD-10-CM

## 2022-04-19 MED ORDER — METFORMIN HCL ER 500 MG PO TB24: 1000.0000 mg | ORAL_TABLET | Freq: Two times a day (BID) | ORAL | 1 refills | Status: DC

## 2022-06-05 ENCOUNTER — Other Ambulatory Visit: Payer: Self-pay | Admitting: Internal Medicine

## 2022-06-05 DIAGNOSIS — E118 Type 2 diabetes mellitus with unspecified complications: Secondary | ICD-10-CM

## 2022-06-05 DIAGNOSIS — I10 Essential (primary) hypertension: Secondary | ICD-10-CM

## 2022-06-07 ENCOUNTER — Ambulatory Visit (INDEPENDENT_AMBULATORY_CARE_PROVIDER_SITE_OTHER): Payer: PPO

## 2022-06-07 VITALS — Ht 62.5 in | Wt 153.0 lb

## 2022-06-07 DIAGNOSIS — Z0000 Encounter for general adult medical examination without abnormal findings: Secondary | ICD-10-CM | POA: Diagnosis not present

## 2022-06-07 NOTE — Progress Notes (Signed)
Subjective:   Sydney Avila is a 76 y.o. female who presents for Medicare Annual (Subsequent) preventive examination.  Review of Systems    Virtual Visit via Telephone Note  I connected with  [name] on 06/07/22 at 11:15 AM EDT by telephone and verified that I am speaking with the correct person using two identifiers.  Location: Patient: Home Provider: [place] Persons participating in the virtual visit: [place]   I discussed the limitations, risks, security and privacy concerns of performing an evaluation and management service by telephone and the availability of in person appointments. The patient expressed understanding and agreed to proceed.  Interactive audio and video telecommunications were attempted between this nurse and patient, however failed, due to patient having technical difficulties OR patient did not have access to video capability.  We continued and completed visit with audio only.  Some vital signs may be absent or patient reported.   Sheral Flow, LPN  Cardiac Risk Factors include: advanced age (>36mn, >>61women);diabetes mellitus;dyslipidemia;family history of premature cardiovascular disease;hypertension     Objective:    Today's Vitals   06/07/22 1152  Weight: 153 lb (69.4 kg)  Height: 5' 2.5" (1.588 m)  PainSc: 0-No pain   Body mass index is 27.54 kg/m.     06/07/2022   12:04 PM 05/06/2014    8:05 AM 04/06/2014    3:57 PM  Advanced Directives  Does Patient Have a Medical Advance Directive? No Patient would like information Patient does not have advance directive  Would patient like information on creating a medical advance directive? Yes (MAU/Ambulatory/Procedural Areas - Information given) Advance directive brochure given (Outpatient ONLY)   Pre-existing out of facility DNR order (yellow form or pink MOST form)   No    Current Medications (verified) Outpatient Encounter Medications as of 06/07/2022   Medication Sig   ALPRAZolam (XANAX) 0.5 MG tablet TAKE ONE TABLET BY MOUTH TWICE A DAY AS NEEDED FOR ANXIETY   blood glucose meter kit and supplies Dispense based on patient and insurance preference. Use up to 2 times daily as directed. (FOR ICD-10 E10.9, E11.9).   cholestyramine (QUESTRAN) 4 g packet TAKE 1 PACKET BY MOUTH THREE TIMES A DAY WITH MEALS   Dulaglutide (TRULICITY) 1.5 MEL/3.8BOSOPN Inject 1.5 mg into the skin once a week.   Lancets (ONETOUCH DELICA PLUS [id number] MISC Use to check blood sugars twice daily. DX: E11.9   lisinopril (ZESTRIL) 20 MG tablet TAKE ONE TABLET BY MOUTH DAILY   metFORMIN (GLUCOPHAGE-XR) 500 MG 24 hr tablet TAKE TWO TABLETS BY MOUTH TWICE A DAY   simvastatin (ZOCOR) 10 MG tablet TAKE ONE TABLET BY MOUTH EVERY NIGHT AT BEDTIME   No facility-administered encounter medications on file as of 06/07/2022.    Allergies (verified) Amoxicillin, Gabapentin, Jardiance [empagliflozin], and Sulfa antibiotics   History: Past Medical History:  Diagnosis Date   Anxiety state, unspecified    Chest pain, unspecified    Fibroid    HTN (hypertension)    Hypercholesterolemia    Obesity    Type II or unspecified type diabetes mellitus without mention of complication, not stated as uncontrolled    Unspecified hemorrhagic conditions    Unspecified menopausal and postmenopausal disorder    Past Surgical History:  Procedure Laterality Date   ABDOMINAL HYSTERECTOMY  1983   TAH BSO   BREAST BIOPSY Left    CATARACT EXTRACTION, BILATERAL     CHOLECYSTECTOMY     COLONOSCOPY  04/16/2014  EYE SURGERY  05/19/2014   FOOT SURGERY     MOUTH SURGERY     OOPHORECTOMY     BSO   OVARIAN CYST SURGERY     SALPINGOOPHORECTOMY     Family History  Problem Relation Age of Onset   Uterine cancer Mother    Diabetes Father    Heart disease Father    Diabetes Maternal Grandmother    Diabetes Maternal Grandfather    Early death Neg Hx    Kidney disease Neg Hx    Hyperlipidemia  Neg Hx    Stroke Neg Hx    Colon cancer Neg Hx    Social History   Socioeconomic History   Marital status: Divorced    Spouse name: Not on file   Number of children: Not on file   Years of education: Not on file   Highest education level: Not on file  Occupational History   Not on file  Tobacco Use   Smoking status: Former   Smokeless tobacco: Never  Substance and Sexual Activity   Alcohol use: No    Comment: Rare   Drug use: No   Sexual activity: Never    Birth control/protection: Surgical  Other Topics Concern   Not on file  Social History Narrative   Not on file   Social Determinants of Health   Financial Resource Strain: Low Risk  (06/07/2022)   Overall Financial Resource Strain (CARDIA)    Difficulty of Paying Living Expenses: Not hard at all  Food Insecurity: No Food Insecurity (06/07/2022)   Hunger Vital Sign    Worried About Running Out of Food in the Last Year: Never true    Ran Out of Food in the Last Year: Never true  Transportation Needs: No Transportation Needs (06/07/2022)   PRAPARE - Hydrologist (Medical): No    Lack of Transportation (Non-Medical): No  Physical Activity: Sufficiently Active (06/07/2022)   Exercise Vital Sign    Days of Exercise per Week: 7 days    Minutes of Exercise per Session: 30 min  Stress: No Stress Concern Present (06/07/2022)   [place]    Feeling of Stress : Not at all  Social Connections: Black (06/07/2022)   Social Connection and Isolation Panel [NHANES]    Frequency of Communication with Friends and Family: More than three times a week    Frequency of Social Gatherings with Friends and Family: More than three times a week    Attends Religious Services: More than 4 times per year    Active Member of [employer] or Organizations: Yes    Attends Music therapist: More than 4 times per year    Marital Status:  Married    Tobacco Counseling Counseling given: Not Answered   Clinical Intake:  Pre-visit preparation completed: Yes  Pain : No/denies pain Pain Score: 0-No pain     BMI - recorded: 27.54 Nutritional Status: BMI 25 -29 Overweight Nutritional Risks: None Diabetes: Yes CBG done?: No Did pt. bring in CBG monitor from home?: No  How often do you need to have someone help you when you read instructions, pamphlets, or other written materials from your doctor or pharmacy?: 1 - Never What is the last grade level you completed in school?: HSG; 3 years of college  Nutrition Risk Assessment:  Has the patient had any N/V/D within the last 2 months?  No  Does the patient have any  non-healing wounds?  No  Has the patient had any unintentional weight loss or weight gain?  No   Diabetes:  Is the patient diabetic?  Yes  If diabetic, was a CBG obtained today?  No  Did the patient bring in their glucometer from home?  No  How often do you monitor your CBG's?  Every other day.   Financial Strains and Diabetes Management:  Are you having any financial strains with the device, your supplies or your medication? No .  Does the patient want to be seen by Chronic Care Management for management of their diabetes?  No  Would the patient like to be referred to a Nutritionist or for Diabetic Management?  No   Diabetic Exams:  Diabetic Eye Exam: Completed 11/14/2021. Overdue for diabetic eye exam. Pt has been advised about the importance in completing this exam. A referral has been placed today. Message sent to referral coordinator for scheduling purposes. Advised pt to expect a call from office referred to regarding appt.  Diabetic Foot Exam: Completed 09/30/2021. Pt has been advised about the importance in completing this exam. Pt is scheduled for diabetic foot exam on 09/30/2022.    Interpreter Needed?: No  Information entered by :: Lisette Abu, LPN.   Activities of Daily Living     06/07/2022   12:07 PM  In your present state of health, do you have any difficulty performing the following activities:  Hearing? 0  Vision? 0  Difficulty concentrating or making decisions? 0  Walking or climbing stairs? 0  Dressing or bathing? 0  Doing errands, shopping? 0  Preparing Food and eating ? N  Using the Toilet? N  In the past six months, have you accidently leaked urine? N  Do you have problems with loss of bowel control? N  Managing your Medications? N  Managing your Finances? N  Housekeeping or managing your Housekeeping? N    Patient Care Team: Janith Lima, MD as PCP - General (Internal Medicine)  Indicate any recent Medical Services you may have received from other than [facility] providers in the past year (date may be approximate).     Assessment:   This is a routine wellness examination for [facility].  Hearing/Vision screen Hearing Screening - Comments:: Denies hearing difficulties   Vision Screening - Comments:: Wears rx glasses - up to date with routine eye exams with Rutherford Guys, MD.   Dietary issues and exercise activities discussed: Current Exercise Habits: Home exercise routine, Type of exercise: walking (walks her dog), Time (Minutes): 30, Frequency (Times/Week): 5, Weekly Exercise (Minutes/Week): 150, Intensity: Moderate, Exercise limited by: None identified   Goals Addressed             This Visit's Progress    Start back exercising and doing water aeorbics at [facility].        Depression Screen    06/07/2022   11:59 AM 04/18/2022    2:40 PM 09/29/2021    1:21 PM 06/02/2020    1:30 PM 04/23/2019    4:19 PM 07/26/2017    4:24 PM 10/27/2015    2:45 PM  PHQ 2/9 Scores  PHQ - 2 Score 0 0 0 0 0 0 0  PHQ- 9 Score 0 0         Fall Risk    06/07/2022   11:28 AM 09/29/2021    1:21 PM 06/02/2020    1:30 PM 04/23/2019    4:18 PM 07/26/2017    4:24 PM  Fall Risk   Falls in the past year? 0 0 0 0 No  Number falls in past yr: 0  0 0    Injury with Fall? 0  0 0   Risk for fall due to : No Fall Risks  No Fall Risks    Follow up Falls prevention discussed  Falls evaluation completed Falls evaluation completed     [facility]:  Any stairs in or around the home? Yes  If so, are there any without handrails? No  Home free of loose throw rugs in walkways, pet beds, electrical cords, etc? Yes  Adequate lighting in your home to reduce risk of falls? Yes   ASSISTIVE DEVICES UTILIZED TO PREVENT FALLS:  Life alert? No  Use of a cane, walker or w/c? No  Grab bars in the bathroom? Yes  Shower chair or bench in shower? No  Elevated toilet seat or a handicapped toilet? No   TIMED UP AND GO:  Was the test performed? No .  Length of time to ambulate 10 feet: n/a sec.   Appearance of gait: Gait not evaluated during this visit.  Cognitive Function:        06/07/2022   12:09 PM  6CIT Screen  What Year? 0 points  What month? 0 points  What time? 0 points  Count back from 20 0 points  Months in reverse 0 points  Repeat phrase 0 points  Total Score 0 points    Immunizations Immunization History  Administered Date(s) Administered   Fluad Quad(high Dose 65+) 06/20/2019   Influenza Split 07/30/2014   Influenza Whole 10/23/2012   Influenza, High Dose Seasonal PF 08/07/2016, 07/26/2017, 07/25/2018   Influenza,inj,Quad PF,6+ Mos 06/02/2020   Influenza-Unspecified 07/10/2015, 07/15/2021   PFIZER(Purple Top)SARS-COV-2 Vaccination 10/25/2019, 11/13/2019, 07/14/2020, 03/15/2021   Pfizer Covid-19 Vaccine Bivalent Booster 61yr & up 07/27/2021   Pneumococcal Conjugate-13 05/03/2015   Pneumococcal Polysaccharide-23 08/01/2012, 06/20/2019   Tdap 06/29/2011, 06/06/2021   Zoster Recombinat (Shingrix) 04/28/2021, 09/05/2021    TDAP status: Up to date  Flu Vaccine status: Due, Education has been provided regarding the importance of this vaccine. Advised may receive this vaccine at local pharmacy  or Health Dept. Aware to provide a copy of the vaccination record if obtained from local pharmacy or Health Dept. Verbalized acceptance and understanding.  Pneumococcal vaccine status: Up to date  Covid-19 vaccine status: Completed vaccines  Qualifies for Shingles Vaccine? Yes   Zostavax completed Yes   Shingrix Completed?: Yes  Screening Tests Health Maintenance  Topic Date Due   COVID-19 Vaccine (6 - Pfizer series) 11/27/2021   INFLUENZA VACCINE  05/09/2022   Diabetic kidney evaluation - Urine ACR  09/29/2022   FOOT EXAM  09/30/2022   HEMOGLOBIN A1C  10/19/2022   OPHTHALMOLOGY EXAM  11/14/2022   Diabetic kidney evaluation - GFR measurement  04/19/2023   TETANUS/TDAP  06/07/2031   Pneumonia Vaccine 76 Years old  Completed   DEXA SCAN  Completed   Hepatitis C Screening  Completed   Zoster Vaccines- Shingrix  Completed   HPV VACCINES  Aged Out   COLONOSCOPY (Pts 45-42yrInsurance coverage will need to be confirmed)  Discontinued    Health Maintenance  Health Maintenance Due  Topic Date Due   COVID-19 Vaccine (6 - Pfizer series) 11/27/2021   INFLUENZA VACCINE  05/09/2022    Colorectal cancer screening: No longer required.   Mammogram status: Completed 09/06/2021. Repeat every year  Bone Density status: Ordered 04/18/2022.  Pt provided with contact info and advised to call to schedule appt.  Lung Cancer Screening: (Low Dose CT Chest recommended if Age 78-80 years, 30 pack-year currently smoking OR have quit w/in 15years.) does not qualify.   Lung Cancer Screening Referral: no  Additional Screening:  Hepatitis C Screening: does qualify; Completed 08/05/2017  Vision Screening: Recommended annual ophthalmology exams for early detection of glaucoma and other disorders of the eye. Is the patient up to date with their annual eye exam?  Yes  Who is the provider or what is the name of the office in which the patient attends annual eye exams? Rutherford Guys, MD. If pt is not  established with a provider, would they like to be referred to a provider to establish care? No .   Dental Screening: Recommended annual dental exams for proper oral hygiene  Community Resource Referral / Chronic Care Management: CRR required this visit?  No   CCM required this visit?  No      Plan:     I have personally reviewed and noted the following in the patient's chart:   Medical and social history Use of alcohol, tobacco or illicit drugs  Current medications and supplements including opioid prescriptions. Patient is not currently taking opioid prescriptions. Functional ability and status Nutritional status Physical activity Advanced directives List of other physicians Hospitalizations, surgeries, and ER visits in previous 12 months Vitals Screenings to include cognitive, depression, and falls Referrals and appointments  In addition, I have reviewed and discussed with patient certain preventive protocols, quality metrics, and best practice recommendations. A written personalized care plan for preventive services as well as general preventive health recommendations were provided to patient.     Sheral Flow, [place]   2/41/9914   Nurse Notes:  There were no vitals filed for this visit. Patient stated that she has no issues with gait or balance; does not use any assistive devices.

## 2022-06-07 NOTE — Patient Instructions (Signed)
Sydney Avila , Thank you for taking time to come for your Medicare Wellness Visit. I appreciate your ongoing commitment to your health goals. Please review the following plan we discussed and let me know if I can assist you in the future.   Screening recommendations/referrals: Colonoscopy: Discontinued due to age Mammogram: 09/06/2021; due once a year Bone Density: No record of being done since 07/30/2017; Please schedule by calling: [phone] at [place] yearly ophthalmology/optometry visit for glaucoma screening and checkup Recommended yearly dental visit for hygiene and checkup  Vaccinations: Influenza vaccine: 07/15/2021; due Fall 2023 Pneumococcal vaccine: 05/03/2015, 06/20/2019 Tdap vaccine: 06/06/2022 Shingles vaccine: 04/28/2021, 09/05/2021   Covid-19: 10/25/2019, 11/13/2019, 07/14/2020, 03/15/2021, 07/27/2021  Advanced directives: Advance directive discussed with you today. I have provided a copy for you to complete at home and have notarized. Once this is complete please bring a copy in to our office so we can scan it into your chart.  Conditions/risks identified: Yes  Next appointment: Please schedule your next Medicare Wellness Visit with your Nurse Health Advisor in 1 year by calling [phone].   Preventive Care 20 Years and Older, Female Preventive care refers to lifestyle choices and visits with your health care provider that can promote health and wellness. What does preventive care include? A yearly physical exam. This is also called an annual well check. Dental exams once or twice a year. Routine eye exams. Ask your health care provider how often you should have your eyes checked. Personal lifestyle choices, including: Daily care of your teeth and gums. Regular physical activity. Eating a healthy diet. Avoiding tobacco and drug use. Limiting alcohol use. Practicing safe sex. Taking low-dose aspirin every day. Taking vitamin and mineral  supplements as recommended by your health care provider. What happens during an annual well check? The services and screenings done by your health care provider during your annual well check will depend on your age, overall health, lifestyle risk factors, and family history of disease. Counseling  Your health care provider may ask you questions about your: Alcohol use. Tobacco use. Drug use. Emotional well-being. Home and relationship well-being. Sexual activity. Eating habits. History of falls. Memory and ability to understand (cognition). Work and work Statistician. Reproductive health. Screening  You may have the following tests or measurements: Height, weight, and BMI. Blood pressure. Lipid and cholesterol levels. These may be checked every 5 years, or more frequently if you are over 66 years old. Skin check. Lung cancer screening. You may have this screening every year starting at age 49 if you have a 30-pack-year history of smoking and currently smoke or have quit within the past 15 years. Fecal occult blood test (FOBT) of the stool. You may have this test every year starting at age 29. Flexible sigmoidoscopy or colonoscopy. You may have a sigmoidoscopy every 5 years or a colonoscopy every 10 years starting at age 50. Hepatitis C blood test. Hepatitis B blood test. Sexually transmitted disease (STD) testing. Diabetes screening. This is done by checking your blood sugar (glucose) after you have not eaten for a while (fasting). You may have this done every 1-3 years. Bone density scan. This is done to screen for osteoporosis. You may have this done starting at age 24. Mammogram. This may be done every 1-2 years. Talk to your health care provider about how often you should have regular mammograms. Talk with your health care provider about your test results, treatment options, and if necessary, the need for more tests. Vaccines  Your  health care provider may recommend certain  vaccines, such as: Influenza vaccine. This is recommended every year. Tetanus, diphtheria, and acellular pertussis (Tdap, Td) vaccine. You may need a Td booster every 10 years. Zoster vaccine. You may need this after age 48. Pneumococcal 13-valent conjugate (PCV13) vaccine. One dose is recommended after age 45. Pneumococcal polysaccharide (PPSV23) vaccine. One dose is recommended after age 51. Talk to your health care provider about which screenings and vaccines you need and how often you need them. This information is not intended to replace advice given to you by your health care provider. Make sure you discuss any questions you have with your health care provider. Document Released: 10/22/2015 Document Revised: 06/14/2016 Document Reviewed: 07/27/2015 Elsevier Interactive Patient Education  2017 [employer] Prevention in the Home Falls can cause injuries. They can happen to people of all ages. There are many things you can do to make your home safe and to help prevent falls. What can I do on the outside of my home? Regularly fix the edges of walkways and driveways and fix any cracks. Remove anything that might make you trip as you walk through a door, such as a raised step or threshold. Trim any bushes or trees on the path to your home. Use bright outdoor lighting. Clear any walking paths of anything that might make someone trip, such as rocks or tools. Regularly check to see if handrails are loose or broken. Make sure that both sides of any steps have handrails. Any raised decks and porches should have guardrails on the edges. Have any leaves, snow, or ice cleared regularly. Use sand or salt on walking paths during winter. Clean up any spills in your garage right away. This includes oil or grease spills. What can I do in the bathroom? Use night lights. Install grab bars by the toilet and in the tub and shower. Do not use towel bars as grab bars. Use non-skid mats or decals in  the tub or shower. If you need to sit down in the shower, use a plastic, non-slip stool. Keep the floor dry. Clean up any water that spills on the floor as soon as it happens. Remove soap buildup in the tub or shower regularly. Attach bath mats securely with double-sided non-slip rug tape. Do not have throw rugs and other things on the floor that can make you trip. What can I do in the bedroom? Use night lights. Make sure that you have a light by your bed that is easy to reach. Do not use any sheets or blankets that are too big for your bed. They should not hang down onto the floor. Have a firm chair that has side arms. You can use this for support while you get dressed. Do not have throw rugs and other things on the floor that can make you trip. What can I do in the kitchen? Clean up any spills right away. Avoid walking on wet floors. Keep items that you use a lot in easy-to-reach places. If you need to reach something above you, use a strong step stool that has a grab bar. Keep electrical cords out of the way. Do not use floor polish or wax that makes floors slippery. If you must use wax, use non-skid floor wax. Do not have throw rugs and other things on the floor that can make you trip. What can I do with my stairs? Do not leave any items on the stairs. Make sure that there are handrails  on both sides of the stairs and use them. Fix handrails that are broken or loose. Make sure that handrails are as long as the stairways. Check any carpeting to make sure that it is firmly attached to the stairs. Fix any carpet that is loose or worn. Avoid having throw rugs at the top or bottom of the stairs. If you do have throw rugs, attach them to the floor with carpet tape. Make sure that you have a light switch at the top of the stairs and the bottom of the stairs. If you do not have them, ask someone to add them for you. What else can I do to help prevent falls? Wear shoes that: Do not have high  heels. Have rubber bottoms. Are comfortable and fit you well. Are closed at the toe. Do not wear sandals. If you use a stepladder: Make sure that it is fully opened. Do not climb a closed stepladder. Make sure that both sides of the stepladder are locked into place. Ask someone to hold it for you, if possible. Clearly mark and make sure that you can see: Any grab bars or handrails. First and last steps. Where the edge of each step is. Use tools that help you move around (mobility aids) if they are needed. These include: Canes. Walkers. Scooters. Crutches. Turn on the lights when you go into a dark area. Replace any light bulbs as soon as they burn out. Set up your furniture so you have a clear path. Avoid moving your furniture around. If any of your floors are uneven, fix them. If there are any pets around you, be aware of where they are. Review your medicines with your doctor. Some medicines can make you feel dizzy. This can increase your chance of falling. Ask your doctor what other things that you can do to help prevent falls. This information is not intended to replace advice given to you by your health care provider. Make sure you discuss any questions you have with your health care provider. Document Released: 07/22/2009 Document Revised: 03/02/2016 Document Reviewed: 10/30/2014 Elsevier Interactive Patient Education  2017 [employer].

## 2022-06-16 ENCOUNTER — Telehealth: Payer: Self-pay

## 2022-06-16 NOTE — Telephone Encounter (Signed)
Pt is calling after reviewing paperwork to update some of the information.  Please advise

## 2022-06-20 ENCOUNTER — Telehealth: Payer: Self-pay

## 2022-06-20 ENCOUNTER — Ambulatory Visit (INDEPENDENT_AMBULATORY_CARE_PROVIDER_SITE_OTHER)
Admission: RE | Admit: 2022-06-20 | Discharge: 2022-06-20 | Disposition: A | Discharge status: Home / Self Care | Payer: PPO | Source: Ambulatory Visit | Attending: Internal Medicine | Admitting: Internal Medicine

## 2022-06-20 DIAGNOSIS — E2839 Other primary ovarian failure: Secondary | ICD-10-CM

## 2022-06-20 NOTE — Telephone Encounter (Signed)
Patient would like for Dr. Ronnald Ramp to document her family status in her records showing that she has 1 son, who is now 78 years old.

## 2022-06-20 NOTE — Telephone Encounter (Signed)
Spoke with patient via phone.  Patient is upset about her records not reflecting that she has given birth to her son over 67 years ago.  I advised patient that I any complete the Medicare Wellness Visit and I never document how many kids a patient has.  Patient insisted that her records be updated and I again I advised that I do not document births, marriages, etc. Spoke with office manger regarding this situation.

## 2022-06-29 ENCOUNTER — Other Ambulatory Visit: Payer: Self-pay | Admitting: Internal Medicine

## 2022-06-29 DIAGNOSIS — F411 Generalized anxiety disorder: Secondary | ICD-10-CM

## 2022-07-19 ENCOUNTER — Other Ambulatory Visit: Payer: Self-pay | Admitting: Internal Medicine

## 2022-07-19 ENCOUNTER — Telehealth: Payer: Self-pay

## 2022-07-19 DIAGNOSIS — K9089 Other intestinal malabsorption: Secondary | ICD-10-CM

## 2022-07-19 MED ORDER — CHOLESTYRAMINE 4 G PO PACK: PACK | ORAL | 1 refills | Status: DC

## 2022-07-19 NOTE — Telephone Encounter (Signed)
Verbal order given to switch to canister instead of packet.

## 2022-07-19 NOTE — Telephone Encounter (Signed)
Pharmacy is needing the Canister not packets, asked for a verbal okay to switch the prescription.

## 2022-07-19 NOTE — Telephone Encounter (Signed)
MEDICATION: cholestyramine (QUESTRAN) 4 g packet   PHARMACY: [facility] [id number] - [place], [place]  Comments:   **Let patient know to contact pharmacy at the end of the day to make sure medication is ready. **  ** Please notify patient to allow 48-72 hours to process**  **Encourage patient to contact the pharmacy for refills or they can request refills through [facility].**

## 2022-08-10 ENCOUNTER — Telehealth: Payer: Self-pay

## 2022-08-10 NOTE — Telephone Encounter (Signed)
Pt has been informed that [employer] patient assistance application completed but needs to completed pt portion and to bring a copy of her income verification.    She stated that she would stop by next week.

## 2022-08-14 ENCOUNTER — Other Ambulatory Visit (HOSPITAL_BASED_OUTPATIENT_CLINIC_OR_DEPARTMENT_OTHER): Payer: Self-pay | Admitting: Internal Medicine

## 2022-08-14 DIAGNOSIS — Z1231 Encounter for screening mammogram for malignant neoplasm of breast: Secondary | ICD-10-CM

## 2022-08-25 ENCOUNTER — Other Ambulatory Visit: Payer: Self-pay | Admitting: Internal Medicine

## 2022-08-25 DIAGNOSIS — K9089 Other intestinal malabsorption: Secondary | ICD-10-CM

## 2022-08-25 NOTE — Telephone Encounter (Signed)
Patient called and said she's still waiting on a form and its supposed to come in near the beginning of December

## 2022-09-04 NOTE — Telephone Encounter (Signed)
Forms are located up front in pt pick up. Pt should address all highlighted areas on packet.

## 2022-09-04 NOTE — Telephone Encounter (Signed)
Pt says Sydney Avila told her the forms are here. Pt is bringing 2024 income statement from [employer]. Pt says she has to fill out the first page and sign a few places on the form we have here.  Pt call back [phone]

## 2022-09-06 NOTE — Telephone Encounter (Signed)
Spoke with the pt and was able to inform her of Shirron's note about her paper work is her and she is needing to fill the highlighted area's out so that her forms can be faxed to [employer]. Pt states no one has contacted her on this matter and will be here today 09/06/2022 to fill out the area's so that we can fax the papers from our office to [facility] as she was told the papers needed to be faxed from her PCP office.

## 2022-09-06 NOTE — Telephone Encounter (Signed)
Patient brought paperwork into the office and it was faxed by me. Confirmation was received and given to patient. Patient was given copy of paperwork, original has been placed in Dr. Ronnald Ramp box up front.

## 2022-09-07 NOTE — Telephone Encounter (Signed)
[employer] denied application.   Reason given  Pt does not meet insurance eligibility requirements.

## 2022-09-12 ENCOUNTER — Ambulatory Visit (HOSPITAL_BASED_OUTPATIENT_CLINIC_OR_DEPARTMENT_OTHER)
Admission: RE | Admit: 2022-09-12 | Discharge: 2022-09-12 | Disposition: A | Discharge status: Home / Self Care | Payer: PPO | Source: Ambulatory Visit | Attending: Internal Medicine | Admitting: Internal Medicine

## 2022-09-12 ENCOUNTER — Encounter (HOSPITAL_BASED_OUTPATIENT_CLINIC_OR_DEPARTMENT_OTHER): Payer: Self-pay

## 2022-09-12 DIAGNOSIS — Z1231 Encounter for screening mammogram for malignant neoplasm of breast: Secondary | ICD-10-CM | POA: Insufficient documentation

## 2022-09-19 NOTE — Telephone Encounter (Signed)
Pt is requesting a complete copy of the documents that were filled out for the [employer] application so that she can pick them up.  Please call pt when forms are ready to be picked up: [phone]

## 2022-09-19 NOTE — Telephone Encounter (Signed)
Pt has been informed that her copy was ready for pick up. Located at the front desk.  Pt stated that [employer] Rep informed her it was denied due to her not filling her insurance section out correctly.

## 2022-10-12 ENCOUNTER — Other Ambulatory Visit: Payer: Self-pay | Admitting: Internal Medicine

## 2022-10-12 DIAGNOSIS — E785 Hyperlipidemia, unspecified: Secondary | ICD-10-CM

## 2022-10-19 ENCOUNTER — Other Ambulatory Visit: Payer: Self-pay | Admitting: Internal Medicine

## 2022-10-19 DIAGNOSIS — E118 Type 2 diabetes mellitus with unspecified complications: Secondary | ICD-10-CM

## 2022-10-19 DIAGNOSIS — E785 Hyperlipidemia, unspecified: Secondary | ICD-10-CM

## 2022-10-20 ENCOUNTER — Other Ambulatory Visit: Payer: Self-pay | Admitting: Internal Medicine

## 2022-10-20 DIAGNOSIS — E118 Type 2 diabetes mellitus with unspecified complications: Secondary | ICD-10-CM

## 2022-10-20 DIAGNOSIS — E785 Hyperlipidemia, unspecified: Secondary | ICD-10-CM

## 2022-10-20 NOTE — Telephone Encounter (Signed)
PT calls today in regards to their prescriptions. She specifically was asking for the metFORMIN (GLUCOPHAGE-XR) 500 MG 24 hr tablet . I set PT up with a visit for 11/09/22 (We had some earlier slots but nothing she could make it in for). She would like this sent out to [facility] [id number] - [place], [place] if possible.  CB if needed: [phone]

## 2022-11-09 ENCOUNTER — Ambulatory Visit: Payer: PPO | Admitting: Internal Medicine

## 2022-11-12 ENCOUNTER — Other Ambulatory Visit: Payer: Self-pay | Admitting: Internal Medicine

## 2022-11-12 DIAGNOSIS — E785 Hyperlipidemia, unspecified: Secondary | ICD-10-CM

## 2022-12-04 ENCOUNTER — Other Ambulatory Visit: Payer: Self-pay | Admitting: Internal Medicine

## 2022-12-04 DIAGNOSIS — E118 Type 2 diabetes mellitus with unspecified complications: Secondary | ICD-10-CM

## 2022-12-12 ENCOUNTER — Ambulatory Visit (INDEPENDENT_AMBULATORY_CARE_PROVIDER_SITE_OTHER): Payer: PPO | Admitting: Internal Medicine

## 2022-12-12 ENCOUNTER — Encounter: Payer: Self-pay | Admitting: Internal Medicine

## 2022-12-12 VITALS — BP 158/78 | HR 75 | Temp 98.3°F | Ht 62.5 in | Wt 151.0 lb

## 2022-12-12 DIAGNOSIS — I10 Essential (primary) hypertension: Secondary | ICD-10-CM

## 2022-12-12 DIAGNOSIS — E785 Hyperlipidemia, unspecified: Secondary | ICD-10-CM | POA: Diagnosis not present

## 2022-12-12 DIAGNOSIS — L659 Nonscarring hair loss, unspecified: Secondary | ICD-10-CM | POA: Insufficient documentation

## 2022-12-12 DIAGNOSIS — E118 Type 2 diabetes mellitus with unspecified complications: Secondary | ICD-10-CM | POA: Diagnosis not present

## 2022-12-12 DIAGNOSIS — N1831 Chronic kidney disease, stage 3a: Secondary | ICD-10-CM

## 2022-12-12 DIAGNOSIS — E1121 Type 2 diabetes mellitus with diabetic nephropathy: Secondary | ICD-10-CM

## 2022-12-12 DIAGNOSIS — Z0001 Encounter for general adult medical examination with abnormal findings: Secondary | ICD-10-CM | POA: Diagnosis not present

## 2022-12-12 LAB — LIPID PANEL
Cholesterol: 132 mg/dL (ref 0–200)
HDL: 45.9 mg/dL (ref >39.00)
LDL Cholesterol: 54 mg/dL (ref 0–99)
NonHDL: 86.51
Total CHOL/HDL Ratio: 3
Triglycerides: 165 mg/dL — ABNORMAL HIGH (ref 0.0–149.0)
VLDL: 33 mg/dL (ref 0.0–40.0)

## 2022-12-12 LAB — CBC WITH DIFFERENTIAL/PLATELET
Basophils Absolute: 0 10*3/uL (ref 0.0–0.1)
Basophils Relative: 0.6 % (ref 0.0–3.0)
Eosinophils Absolute: 0.3 10*3/uL (ref 0.0–0.7)
Eosinophils Relative: 4.4 % (ref 0.0–5.0)
HCT: 40.3 % (ref 36.0–46.0)
Hemoglobin: 13.6 g/dL (ref 12.0–15.0)
Lymphocytes Relative: 31.2 % (ref 12.0–46.0)
Lymphs Abs: 2.3 10*3/uL (ref 0.7–4.0)
MCHC: 33.7 g/dL (ref 30.0–36.0)
MCV: 96.5 fl (ref 78.0–100.0)
Monocytes Absolute: 0.4 10*3/uL (ref 0.1–1.0)
Monocytes Relative: 5.5 % (ref 3.0–12.0)
Neutro Abs: 4.4 10*3/uL (ref 1.4–7.7)
Neutrophils Relative %: 58.3 % (ref 43.0–77.0)
Platelets: 195 10*3/uL (ref 150.0–400.0)
RBC: 4.18 Mil/uL (ref 3.87–5.11)
RDW: 13.7 % (ref 11.5–15.5)
WBC: 7.5 10*3/uL (ref 4.0–10.5)

## 2022-12-12 LAB — HEPATIC FUNCTION PANEL
ALT: 10 U/L (ref 0–35)
AST: 11 U/L (ref 0–37)
Albumin: 4.1 g/dL (ref 3.5–5.2)
Alkaline Phosphatase: 57 U/L (ref 39–117)
Bilirubin, Direct: 0.2 mg/dL (ref 0.0–0.3)
Total Bilirubin: 0.8 mg/dL (ref 0.2–1.2)
Total Protein: 6.5 g/dL (ref 6.0–8.3)

## 2022-12-12 LAB — BASIC METABOLIC PANEL
BUN: 14 mg/dL (ref 6–23)
CO2: 28 mEq/L (ref 19–32)
Calcium: 9.2 mg/dL (ref 8.4–10.5)
Chloride: 104 mEq/L (ref 96–112)
Creatinine, Ser: 0.8 mg/dL (ref 0.40–1.20)
GFR: 70.27 mL/min (ref >60.00)
Glucose, Bld: 100 mg/dL — ABNORMAL HIGH (ref 70–99)
Potassium: 3.9 mEq/L (ref 3.5–5.1)
Sodium: 140 mEq/L (ref 135–145)

## 2022-12-12 LAB — MICROALBUMIN / CREATININE URINE RATIO
Creatinine,U: 41 mg/dL
Microalb Creat Ratio: 1.7 mg/g (ref 0.0–30.0)
Microalb, Ur: <0.7 mg/dL (ref 0.0–1.9)

## 2022-12-12 LAB — TSH: TSH: 3.7 u[IU]/mL (ref 0.35–5.50)

## 2022-12-12 LAB — HEMOGLOBIN A1C: Hgb A1c MFr Bld: 7 % — ABNORMAL HIGH (ref 4.6–6.5)

## 2022-12-12 MED ORDER — FINASTERIDE 1 MG PO TABS: 1.0000 mg | ORAL_TABLET | Freq: Every day | ORAL | 1 refills | Status: DC

## 2022-12-12 NOTE — Progress Notes (Unsigned)
Subjective:  Patient ID: Sydney Avila, female    DOB: 04-Jan-1946  Age: 77 y.o. MRN: [id number]  CC: Annual Exam, Hypertension, Diabetes, and Hyperlipidemia   HPI Sydney Avila presents for a CPX and f/up ----   Outpatient Medications Prior to Visit  Medication Sig Dispense Refill   ALPRAZolam (XANAX) 0.5 MG tablet TAKE ONE TABLET BY MOUTH TWICE A DAY AS NEEDED FOR ANXIETY 45 tablet 3   blood glucose meter kit and supplies Dispense based on patient and insurance preference. Use up to 2 times daily as directed. (FOR ICD-10 E10.9, E11.9). 1 each 0   cholestyramine (QUESTRAN) 4 g packet TAKE ONE PACKET BY MOUTH THREE TIMES A DAY WITH MEALS 90 packet 1   Dulaglutide (TRULICITY) 1.5 0000000 SOPN Inject 1.5 mg into the skin once a week. 6 mL 1   Lancets (ONETOUCH DELICA PLUS 123XX123) MISC Use to check blood sugars twice daily. DX: E11.9 200 each 3   lisinopril (ZESTRIL) 20 MG tablet TAKE ONE TABLET BY MOUTH DAILY 90 tablet 1   metFORMIN (GLUCOPHAGE-XR) 500 MG 24 hr tablet TAKE 2 TABLETS BY MOUTH TWICE A DAY 120 tablet 0   simvastatin (ZOCOR) 10 MG tablet TAKE 1 TABLET BY MOUTH EVERY NIGHT AT BEDTIME 30 tablet 0   No facility-administered medications prior to visit.    ROS Review of Systems  Objective:  BP (!) 158/78   Pulse 75   Temp 98.3 F (36.8 C) (Oral)   Ht 5' 2.5" (1.588 m)   Wt 151 lb (68.5 kg)   SpO2 98%   BMI 27.18 kg/m   BP Readings from Last 3 Encounters:  12/12/22 (!) 158/78  04/18/22 (!) 148/84  09/29/21 (!) 158/62    Wt Readings from Last 3 Encounters:  12/12/22 151 lb (68.5 kg)  06/07/22 153 lb (69.4 kg)  04/18/22 155 lb (70.3 kg)    Physical Exam Cardiovascular:     Rate and Rhythm: Normal rate and regular rhythm. Occasional Extrasystoles are present.    Comments: EKG- SR with PAC's, 73 bpm Inferior infarct pattern is old No LVH Musculoskeletal:     Right lower leg: No edema.     Left lower leg: No edema.     Lab Results  Component Value  Date   WBC 6.7 09/29/2021   HGB 13.4 09/29/2021   HCT 40.4 09/29/2021   PLT 214.0 09/29/2021   GLUCOSE 121 (H) 04/18/2022   CHOL 126 09/29/2021   TRIG 225.0 (H) 09/29/2021   HDL 48.20 09/29/2021   LDLDIRECT 61.0 09/29/2021   LDLCALC 56 06/02/2020   ALT 12 09/29/2021   AST 14 09/29/2021   NA 139 04/18/2022   K 4.0 04/18/2022   CL 104 04/18/2022   CREATININE 0.82 04/18/2022   BUN 14 04/18/2022   CO2 26 04/18/2022   TSH 2.16 09/29/2021   HGBA1C 7.2 (H) 04/18/2022   MICROALBUR <0.7 09/29/2021    MM 3D SCREEN BREAST BILATERAL  Result Date: 09/13/2022 CLINICAL DATA:  Screening. EXAM: DIGITAL SCREENING BILATERAL MAMMOGRAM WITH TOMOSYNTHESIS AND CAD TECHNIQUE: Bilateral screening digital craniocaudal and mediolateral oblique mammograms were obtained. Bilateral screening digital breast tomosynthesis was performed. The images were evaluated with computer-aided detection. COMPARISON:  Previous exam(s). ACR Breast Density Category c: The breast tissue is heterogeneously dense, which may obscure small masses. FINDINGS: There are no findings suspicious for malignancy. IMPRESSION: No mammographic evidence of malignancy. A result letter of this screening mammogram will be mailed directly to the patient. RECOMMENDATION: Screening  mammogram in one year. (Code:SM-B-01Y) BI-RADS CATEGORY  1: Negative. Electronically Signed   By: Lillia Mountain M.D.   On: 09/13/2022 09:16    Assessment & Plan:   Sydney Avila was seen today for annual exam, hypertension, diabetes and hyperlipidemia.  Diagnoses and all orders for this visit:  Essential hypertension, benign -     CBC with Differential/Platelet; Future -     Basic metabolic panel; Future -     Hepatic function panel; Future -     TSH; Future -     Urinalysis, Routine w reflex microscopic; Future -     EKG 12-Lead -     Urinalysis, Routine w reflex microscopic -     TSH -     Hepatic function panel -     Basic metabolic panel -     CBC with  Differential/Platelet  Type 2 diabetes mellitus with complication, without long-term current use of insulin (HCC) -     Basic metabolic panel; Future -     Hemoglobin A1c; Future -     Microalbumin / creatinine urine ratio; Future -     HM Diabetes Foot Exam -     Microalbumin / creatinine urine ratio -     Hemoglobin A1c -     Basic metabolic panel  Hyperlipidemia with target LDL less than 100 -     Lipid panel; Future -     Hepatic function panel; Future -     TSH; Future -     TSH -     Hepatic function panel -     Lipid panel  Encounter for general adult medical examination with abnormal findings  [place] -     finasteride (PROPECIA) 1 MG tablet; Take 1 tablet (1 mg total) by mouth daily.   I am having Lemmie Evens. Stann Mainland "Collie Siad" start on finasteride. I am also having her maintain her OneTouch Delica Plus 0000000, Trulicity, blood glucose meter kit and supplies, lisinopril, ALPRAZolam, cholestyramine, simvastatin, and metFORMIN.  Meds ordered this encounter  Medications   finasteride (PROPECIA) 1 MG tablet    Sig: Take 1 tablet (1 mg total) by mouth daily.    Dispense:  90 tablet    Refill:  1     Follow-up: Return in about 6 months (around 06/14/2023).  [name], MD

## 2022-12-12 NOTE — Patient Instructions (Signed)

## 2022-12-13 ENCOUNTER — Telehealth: Payer: Self-pay | Admitting: Radiology

## 2022-12-13 DIAGNOSIS — E1121 Type 2 diabetes mellitus with diabetic nephropathy: Secondary | ICD-10-CM | POA: Insufficient documentation

## 2022-12-13 DIAGNOSIS — N1831 Chronic kidney disease, stage 3a: Secondary | ICD-10-CM | POA: Insufficient documentation

## 2022-12-13 LAB — URINALYSIS, ROUTINE W REFLEX MICROSCOPIC
Bilirubin Urine: NEGATIVE
Hgb urine dipstick: NEGATIVE
Ketones, ur: NEGATIVE
Leukocytes,Ua: NEGATIVE
Nitrite: NEGATIVE
RBC / HPF: NONE SEEN (ref 0–?)
Specific Gravity, Urine: 1.01 (ref 1.000–1.030)
Total Protein, Urine: NEGATIVE
Urine Glucose: NEGATIVE
Urobilinogen, UA: 0.2 (ref 0.0–1.0)
WBC, UA: NONE SEEN (ref 0–?)
pH: 5.5 (ref 5.0–8.0)

## 2022-12-13 MED ORDER — KERENDIA 10 MG PO TABS: 1.0000 | ORAL_TABLET | Freq: Every day | ORAL | 0 refills | Status: DC

## 2022-12-13 MED ORDER — SIMVASTATIN 10 MG PO TABS: 10.0000 mg | ORAL_TABLET | Freq: Every day | ORAL | 1 refills | Status: DC

## 2022-12-13 MED ORDER — LISINOPRIL 20 MG PO TABS: 20.0000 mg | ORAL_TABLET | Freq: Every day | ORAL | 1 refills | Status: DC

## 2022-12-13 NOTE — Telephone Encounter (Signed)
If patient is to be on this medication the doctor has to call Advanced Clinical team - Phone [phone] Option 2  After this the insurance will approve the medication and the patient will be able to afford it.  Please call patient and let her know when this has been done.

## 2022-12-15 ENCOUNTER — Other Ambulatory Visit: Payer: Self-pay | Admitting: Internal Medicine

## 2022-12-15 DIAGNOSIS — F411 Generalized anxiety disorder: Secondary | ICD-10-CM

## 2022-12-15 DIAGNOSIS — E785 Hyperlipidemia, unspecified: Secondary | ICD-10-CM

## 2022-12-17 ENCOUNTER — Other Ambulatory Visit: Payer: Self-pay | Admitting: Internal Medicine

## 2022-12-17 DIAGNOSIS — E118 Type 2 diabetes mellitus with unspecified complications: Secondary | ICD-10-CM

## 2022-12-17 DIAGNOSIS — K9089 Other intestinal malabsorption: Secondary | ICD-10-CM

## 2022-12-18 ENCOUNTER — Telehealth: Payer: Self-pay

## 2022-12-18 NOTE — Telephone Encounter (Signed)
Pharmacy Patient Advocate Encounter  Prior Authorization for Carrington Clamp has been approved by [facility]    [place] # [id number] Effective dates: 12/15/2022 through 12/15/2023

## 2022-12-18 NOTE — Telephone Encounter (Signed)
Key: [id number]

## 2023-01-22 DIAGNOSIS — L821 Other seborrheic keratosis: Secondary | ICD-10-CM | POA: Diagnosis not present

## 2023-01-22 DIAGNOSIS — L720 Epidermal cyst: Secondary | ICD-10-CM | POA: Diagnosis not present

## 2023-01-22 DIAGNOSIS — L820 Inflamed seborrheic keratosis: Secondary | ICD-10-CM | POA: Diagnosis not present

## 2023-01-27 ENCOUNTER — Other Ambulatory Visit: Payer: Self-pay | Admitting: Internal Medicine

## 2023-01-27 DIAGNOSIS — E118 Type 2 diabetes mellitus with unspecified complications: Secondary | ICD-10-CM

## 2023-02-19 DIAGNOSIS — Z961 Presence of intraocular lens: Secondary | ICD-10-CM | POA: Diagnosis not present

## 2023-02-19 DIAGNOSIS — E119 Type 2 diabetes mellitus without complications: Secondary | ICD-10-CM | POA: Diagnosis not present

## 2023-02-19 DIAGNOSIS — H52203 Unspecified astigmatism, bilateral: Secondary | ICD-10-CM | POA: Diagnosis not present

## 2023-02-19 DIAGNOSIS — H5213 Myopia, bilateral: Secondary | ICD-10-CM | POA: Diagnosis not present

## 2023-02-19 DIAGNOSIS — H524 Presbyopia: Secondary | ICD-10-CM | POA: Diagnosis not present

## 2023-03-09 ENCOUNTER — Other Ambulatory Visit: Payer: Self-pay | Admitting: Internal Medicine

## 2023-03-09 DIAGNOSIS — E118 Type 2 diabetes mellitus with unspecified complications: Secondary | ICD-10-CM

## 2023-03-21 LAB — HM DIABETES EYE EXAM

## 2023-04-19 ENCOUNTER — Other Ambulatory Visit: Payer: Self-pay | Admitting: Internal Medicine

## 2023-04-19 DIAGNOSIS — F411 Generalized anxiety disorder: Secondary | ICD-10-CM

## 2023-04-30 ENCOUNTER — Telehealth: Payer: Self-pay | Admitting: *Deleted

## 2023-04-30 NOTE — Telephone Encounter (Signed)
I connected with Sydney Avila on 7/22 at 1421 by telephone and verified that I am speaking with the correct person using two identifiers. According to the patient's chart they are due for follow up in Sep with [facility]. Pt will call back to set this appt up when she has her calendar. Nothing further was needed at the end of our conversation.

## 2023-04-30 NOTE — Telephone Encounter (Signed)
I attempted to contact patient by telephone but was unsuccessful. According to the patient's chart they are due for follow up in Sept with [facility]. I have left a HIPAA compliant message advising the patient to contact [facility] at [phone]. I will continue to follow up with the patient to make sure this appointment is scheduled.

## 2023-04-30 NOTE — Telephone Encounter (Signed)
Called pt to schedule follow up appt. Pt would like blood work orders before appt 06/25/2023

## 2023-05-24 ENCOUNTER — Encounter (INDEPENDENT_AMBULATORY_CARE_PROVIDER_SITE_OTHER): Payer: Self-pay

## 2023-06-03 ENCOUNTER — Other Ambulatory Visit: Payer: Self-pay | Admitting: Internal Medicine

## 2023-06-03 DIAGNOSIS — E785 Hyperlipidemia, unspecified: Secondary | ICD-10-CM

## 2023-06-13 ENCOUNTER — Other Ambulatory Visit: Payer: Self-pay | Admitting: Internal Medicine

## 2023-06-13 DIAGNOSIS — K9089 Other intestinal malabsorption: Secondary | ICD-10-CM

## 2023-06-25 ENCOUNTER — Encounter: Payer: Self-pay | Admitting: Internal Medicine

## 2023-06-25 ENCOUNTER — Ambulatory Visit (INDEPENDENT_AMBULATORY_CARE_PROVIDER_SITE_OTHER): Payer: PPO | Admitting: Internal Medicine

## 2023-06-25 VITALS — BP 146/84 | HR 76 | Temp 97.9°F | Resp 16 | Ht 62.5 in | Wt 147.0 lb

## 2023-06-25 DIAGNOSIS — E118 Type 2 diabetes mellitus with unspecified complications: Secondary | ICD-10-CM

## 2023-06-25 DIAGNOSIS — L2084 Intrinsic (allergic) eczema: Secondary | ICD-10-CM | POA: Diagnosis not present

## 2023-06-25 DIAGNOSIS — I10 Essential (primary) hypertension: Secondary | ICD-10-CM | POA: Diagnosis not present

## 2023-06-25 DIAGNOSIS — Z7984 Long term (current) use of oral hypoglycemic drugs: Secondary | ICD-10-CM | POA: Diagnosis not present

## 2023-06-25 DIAGNOSIS — Z7985 Long-term (current) use of injectable non-insulin antidiabetic drugs: Secondary | ICD-10-CM | POA: Diagnosis not present

## 2023-06-25 DIAGNOSIS — N1831 Chronic kidney disease, stage 3a: Secondary | ICD-10-CM | POA: Diagnosis not present

## 2023-06-25 DIAGNOSIS — L309 Dermatitis, unspecified: Secondary | ICD-10-CM | POA: Insufficient documentation

## 2023-06-25 LAB — CBC WITH DIFFERENTIAL/PLATELET
Basophils Absolute: 0 10*3/uL (ref 0.0–0.1)
Basophils Relative: 0.5 % (ref 0.0–3.0)
Eosinophils Absolute: 0.1 10*3/uL (ref 0.0–0.7)
Eosinophils Relative: 1.5 % (ref 0.0–5.0)
HCT: 41.9 % (ref 36.0–46.0)
Hemoglobin: 13.6 g/dL (ref 12.0–15.0)
Lymphocytes Relative: 31.6 % (ref 12.0–46.0)
Lymphs Abs: 2.3 10*3/uL (ref 0.7–4.0)
MCHC: 32.6 g/dL (ref 30.0–36.0)
MCV: 98 fl (ref 78.0–100.0)
Monocytes Absolute: 0.5 10*3/uL (ref 0.1–1.0)
Monocytes Relative: 6.2 % (ref 3.0–12.0)
Neutro Abs: 4.4 10*3/uL (ref 1.4–7.7)
Neutrophils Relative %: 60.2 % (ref 43.0–77.0)
Platelets: 193 10*3/uL (ref 150.0–400.0)
RBC: 4.27 Mil/uL (ref 3.87–5.11)
RDW: 14.4 % (ref 11.5–15.5)
WBC: 7.3 10*3/uL (ref 4.0–10.5)

## 2023-06-25 LAB — BASIC METABOLIC PANEL
BUN: 13 mg/dL (ref 6–23)
CO2: 28 meq/L (ref 19–32)
Calcium: 9.2 mg/dL (ref 8.4–10.5)
Chloride: 101 mEq/L (ref 96–112)
Creatinine, Ser: 0.87 mg/dL (ref 0.40–1.20)
GFR: 63.3 mL/min (ref >60.00)
Glucose, Bld: 104 mg/dL — ABNORMAL HIGH (ref 70–99)
Potassium: 4.3 meq/L (ref 3.5–5.1)
Sodium: 138 meq/L (ref 135–145)

## 2023-06-25 LAB — HEMOGLOBIN A1C: Hgb A1c MFr Bld: 6.7 % — ABNORMAL HIGH (ref 4.6–6.5)

## 2023-06-25 MED ORDER — FLUOCINONIDE 0.05 % EX CREA: 1.0000 | TOPICAL_CREAM | Freq: Two times a day (BID) | CUTANEOUS | 1 refills | Status: AC

## 2023-06-25 NOTE — Patient Instructions (Signed)

## 2023-06-25 NOTE — Progress Notes (Unsigned)
Subjective:  Patient ID: Sydney Avila, female    DOB: 14-Aug-1946  Age: 77 y.o. MRN: [id number]  CC: Rash, Hypertension, and Diabetes   HPI OPIE LIAO presents for f/up ---  Discussed the use of AI scribe software for clinical note transcription with the patient, who gave verbal consent to proceed.  History of Present Illness   The patient presents with a persistent scalp rash that has been present for an unspecified duration. The rash is described as itchy, however, no treatments have been applied to alleviate the symptoms. The patient is currently on Metformin, but there is no indication that this medication is contributing to the reported symptoms.       Outpatient Medications Prior to Visit  Medication Sig Dispense Refill   ALPRAZolam (XANAX) 0.5 MG tablet TAKE ONE TABLET BY MOUTH TWICE A DAY AS NEEDED FOR ANXIETY 45 tablet 2   blood glucose meter kit and supplies Dispense based on patient and insurance preference. Use up to 2 times daily as directed. (FOR ICD-10 E10.9, E11.9). 1 each 0   cholestyramine (QUESTRAN) 4 g packet TAKE 1 PACKET BY MOUTH 3 TIMES A DAY WITH MEALS 90 packet 0   Dulaglutide (TRULICITY) 1.5 MG/0.5ML SOPN Inject 1.5 mg into the skin once a week. 6 mL 1   Lancets (ONETOUCH DELICA PLUS LANCET33G) MISC Use to check blood sugars twice daily. DX: E11.9 200 each 3   lisinopril (ZESTRIL) 20 MG tablet Take 1 tablet (20 mg total) by mouth daily. 90 tablet 1   metFORMIN (GLUCOPHAGE-XR) 500 MG 24 hr tablet Take 2 tablets (1,000 mg total) by mouth 2 (two) times daily. Per MD Return in about 6 months (around 06/14/2023) for future refills 120 tablet 4   simvastatin (ZOCOR) 10 MG tablet TAKE 1 TABLET BY MOUTH EVERY NIGHT AT BEDTIME 90 tablet 0   finasteride (PROPECIA) 1 MG tablet Take 1 tablet (1 mg total) by mouth daily. 90 tablet 1   Finerenone (KERENDIA) 10 MG TABS Take 1 tablet (10 mg total) by mouth daily. 30 tablet 0   No facility-administered medications prior  to visit.    ROS Review of Systems  Constitutional: Negative.  Negative for chills, diaphoresis, fatigue and fever.  HENT: Negative.    Eyes: Negative.   Respiratory:  Negative for cough, chest tightness, shortness of breath and wheezing.   Cardiovascular:  Negative for chest pain, palpitations and leg swelling.  Gastrointestinal:  Negative for abdominal pain, constipation, diarrhea, nausea and vomiting.  Endocrine: Negative.   Genitourinary: Negative.  Negative for difficulty urinating.  Musculoskeletal:  Negative for arthralgias, joint swelling and myalgias.  Skin:  Positive for rash. Negative for color change.  Neurological:  Negative for dizziness and weakness.  Hematological:  Negative for adenopathy. Does not bruise/bleed easily.  Psychiatric/Behavioral: Negative.      Objective:  BP (!) 146/84 (BP Location: Left Arm, Patient Position: Sitting, Cuff Size: Normal)   Pulse 76   Temp 97.9 F (36.6 C) (Oral)   Resp 16   Ht 5' 2.5" (1.588 m)   Wt 147 lb (66.7 kg)   SpO2 97%   BMI 26.46 kg/m   BP Readings from Last 3 Encounters:  06/25/23 (!) 146/84  12/12/22 (!) 158/78  04/18/22 (!) 148/84    Wt Readings from Last 3 Encounters:  06/25/23 147 lb (66.7 kg)  12/12/22 151 lb (68.5 kg)  06/07/22 153 lb (69.4 kg)    Physical Exam Vitals reviewed.  HENT:  Mouth/Throat:     Mouth: Mucous membranes are moist.  Eyes:     General: No scleral icterus.    Conjunctiva/sclera: Conjunctivae normal.  Cardiovascular:     Rate and Rhythm: Normal rate and regular rhythm.     Pulses: Normal pulses.     Heart sounds: No murmur heard.    No friction rub. No gallop.  Pulmonary:     Effort: Pulmonary effort is normal.     Breath sounds: No stridor. No wheezing, rhonchi or rales.  Abdominal:     General: Abdomen is flat.     Palpations: There is no mass.     Tenderness: There is no abdominal tenderness. There is no guarding.     Hernia: No hernia is present.   Musculoskeletal:     Cervical back: Neck supple.  Lymphadenopathy:     Cervical: No cervical adenopathy.  Skin:    General: Skin is warm.     Findings: Rash present. No erythema or lesion.  Neurological:     General: No focal deficit present.     Mental Status: She is alert.  Psychiatric:        Mood and Affect: Mood normal.        Behavior: Behavior normal.     Lab Results  Component Value Date   WBC 7.3 06/25/2023   HGB 13.6 06/25/2023   HCT 41.9 06/25/2023   PLT 193.0 06/25/2023   GLUCOSE 104 (H) 06/25/2023   CHOL 132 12/12/2022   TRIG 165.0 (H) 12/12/2022   HDL 45.90 12/12/2022   LDLDIRECT 61.0 09/29/2021   LDLCALC 54 12/12/2022   ALT 10 12/12/2022   AST 11 12/12/2022   NA 138 06/25/2023   K 4.3 06/25/2023   CL 101 06/25/2023   CREATININE 0.87 06/25/2023   BUN 13 06/25/2023   CO2 28 06/25/2023   TSH 3.70 12/12/2022   HGBA1C 6.7 (H) 06/25/2023   MICROALBUR <0.7 12/12/2022    MM 3D SCREEN BREAST BILATERAL  Result Date: 09/13/2022 CLINICAL DATA:  Screening. EXAM: DIGITAL SCREENING BILATERAL MAMMOGRAM WITH TOMOSYNTHESIS AND CAD TECHNIQUE: Bilateral screening digital craniocaudal and mediolateral oblique mammograms were obtained. Bilateral screening digital breast tomosynthesis was performed. The images were evaluated with computer-aided detection. COMPARISON:  Previous exam(s). ACR Breast Density Category c: The breast tissue is heterogeneously dense, which may obscure small masses. FINDINGS: There are no findings suspicious for malignancy. IMPRESSION: No mammographic evidence of malignancy. A result letter of this screening mammogram will be mailed directly to the patient. RECOMMENDATION: Screening mammogram in one year. (Code:SM-B-01Y) BI-RADS CATEGORY  1: Negative. Electronically Signed   By: Baird Lyons M.D.   On: 09/13/2022 09:16    Assessment & Plan:   Essential hypertension, benign - Her BP is well controlled. -     Basic metabolic panel; Future -     CBC  with Differential/Platelet; Future  Type 2 diabetes mellitus with complication, without long-term current use of insulin (HCC)- Her blood sugar is well controlled. -     Basic metabolic panel; Future -     Hemoglobin A1c; Future  Stage 3a chronic kidney disease (HCC) - Renal fxn is stable. -     Basic metabolic panel; Future -     CBC with Differential/Platelet; Future  Intrinsic eczema -     Fluocinonide; Apply 1 Application topically 2 (two) times daily.  Dispense: 120 g; Refill: 1     Follow-up: Return in about 6 months (around 12/23/2023).  Sanda Linger, MD

## 2023-07-09 ENCOUNTER — Other Ambulatory Visit: Payer: Self-pay | Admitting: Internal Medicine

## 2023-07-09 ENCOUNTER — Telehealth: Payer: Self-pay | Admitting: Internal Medicine

## 2023-07-09 DIAGNOSIS — B370 Candidal stomatitis: Secondary | ICD-10-CM | POA: Insufficient documentation

## 2023-07-09 MED ORDER — CLOTRIMAZOLE 10 MG MT TROC
10.0000 mg | Freq: Three times a day (TID) | OROMUCOSAL | 1 refills | Status: DC
Start: 2023-07-09 — End: 2024-01-09

## 2023-07-09 NOTE — Telephone Encounter (Signed)
Patient called and said that she has a white tongue now and her pharmacist said she would need a prescription mouth wash called in.  Please advise.  Patient's number:  [phone]

## 2023-07-26 DIAGNOSIS — J029 Acute pharyngitis, unspecified: Secondary | ICD-10-CM | POA: Diagnosis not present

## 2023-07-26 DIAGNOSIS — K140 Glossitis: Secondary | ICD-10-CM | POA: Diagnosis not present

## 2023-08-04 ENCOUNTER — Other Ambulatory Visit: Payer: Self-pay | Admitting: Internal Medicine

## 2023-08-04 DIAGNOSIS — E118 Type 2 diabetes mellitus with unspecified complications: Secondary | ICD-10-CM

## 2023-08-08 ENCOUNTER — Other Ambulatory Visit: Payer: Self-pay | Admitting: Internal Medicine

## 2023-08-08 DIAGNOSIS — Z1231 Encounter for screening mammogram for malignant neoplasm of breast: Secondary | ICD-10-CM

## 2023-08-25 ENCOUNTER — Other Ambulatory Visit: Payer: Self-pay | Admitting: Internal Medicine

## 2023-08-25 DIAGNOSIS — F411 Generalized anxiety disorder: Secondary | ICD-10-CM

## 2023-09-06 ENCOUNTER — Other Ambulatory Visit: Payer: Self-pay | Admitting: Internal Medicine

## 2023-09-06 DIAGNOSIS — E785 Hyperlipidemia, unspecified: Secondary | ICD-10-CM

## 2023-09-09 ENCOUNTER — Other Ambulatory Visit: Payer: Self-pay | Admitting: Internal Medicine

## 2023-10-06 ENCOUNTER — Other Ambulatory Visit: Payer: Self-pay | Admitting: Internal Medicine

## 2023-10-06 DIAGNOSIS — E118 Type 2 diabetes mellitus with unspecified complications: Secondary | ICD-10-CM

## 2023-10-06 DIAGNOSIS — E785 Hyperlipidemia, unspecified: Secondary | ICD-10-CM

## 2023-10-06 DIAGNOSIS — K9089 Other intestinal malabsorption: Secondary | ICD-10-CM

## 2023-10-12 ENCOUNTER — Ambulatory Visit (INDEPENDENT_AMBULATORY_CARE_PROVIDER_SITE_OTHER): Payer: PPO

## 2023-10-12 VITALS — BP 148/66 | HR 72 | Temp 97.4°F

## 2023-10-12 DIAGNOSIS — Z0000 Encounter for general adult medical examination without abnormal findings: Secondary | ICD-10-CM

## 2023-10-12 NOTE — Progress Notes (Signed)
 Subjective:   Sydney Avila is a 78 y.o. female who presents for Medicare Annual (Subsequent) preventive examination.  Visit Complete: Virtual I connected with  [name] on 10/12/23 by a audio enabled telemedicine application and verified that I am speaking with the correct person using two identifiers.  Interactive audio and video telecommunications were attempted between this provider and patient, however failed, due to patient having technical difficulties OR patient did not have access to video capability.  We continued and completed visit with audio only.   Patient Location: Home  Provider Location: Home Office  I discussed the limitations of evaluation and management by telemedicine. The patient expressed understanding and agreed to proceed.  Vital Signs: Because this visit was a virtual/telehealth visit, some criteria may be missing or patient reported. Any vitals not documented were not able to be obtained and vitals that have been documented are patient reported.    Cardiac Risk Factors include: advanced age (>13men, >76 women);diabetes mellitus;dyslipidemia;hypertension     Objective:    Today's Vitals   10/12/23 1506  BP: (!) 148/66  Pulse: 72  Temp: (!) 97.4 F (36.3 C)   There is no height or weight on file to calculate BMI.     10/12/2023    3:35 PM 06/07/2022   12:04 PM 05/06/2014    8:05 AM 04/06/2014    3:57 PM  Advanced Directives  Does Patient Have a Medical Advance Directive? Yes No Patient would like information Patient does not have advance directive  Type of Advance Directive Healthcare Power of [place];Living will     Copy of Healthcare Power of Attorney in Chart? Yes - validated most recent copy scanned in chart (See row information)     Would patient like information on creating a medical advance directive?  Yes (MAU/Ambulatory/Procedural Areas - Information given) Advance directive brochure given (Outpatient ONLY)   Pre-existing out of  facility DNR order (yellow form or pink MOST form)    No    Current Medications (verified) Outpatient Encounter Medications as of 10/12/2023  Medication Sig   ALPRAZolam  (XANAX ) 0.5 MG tablet TAKE ONE TABLET BY MOUTH TWICE A DAY AS NEEDED FOR ANXIETY   blood glucose meter kit and supplies Dispense based on patient and insurance preference. Use up to 2 times daily as directed. (FOR ICD-10 E10.9, E11.9).   cholestyramine  (QUESTRAN ) 4 g packet TAKE 1 PACKET BY MOUTH 3 TIMES A DAY WITH MEALS   Dulaglutide  (TRULICITY ) 1.5 MG/0.5ML SOPN Inject 1.5 mg into the skin once a week.   fluocinonide  cream (LIDEX ) 0.05 % Apply 1 Application topically 2 (two) times daily.   Lancets (ONETOUCH DELICA PLUS LANCET33G) MISC Use to check blood sugars twice daily. DX: E11.9   lisinopril  (ZESTRIL ) 20 MG tablet Take 1 tablet (20 mg total) by mouth daily.   metFORMIN  (GLUCOPHAGE -XR) 500 MG 24 hr tablet TAKE 2 TABLETS BY MOUTH TWICE A DAY   NEOMYCIN -POLYMYXIN-HYDROCORTISONE (CORTISPORIN) 1 % SOLN OTIC solution PLACE 3 DROPS INTO THE LEFT EAR EVERY 8 (EIGHT) HOURS.   simvastatin  (ZOCOR ) 10 MG tablet TAKE 1 TABLET BY MOUTH EVERY NIGHT AT BEDTIME   clotrimazole  (MYCELEX ) 10 MG troche Take 1 tablet (10 mg total) by mouth 3 (three) times daily.   No facility-administered encounter medications on file as of 10/12/2023.    Allergies (verified) Amoxicillin, Gabapentin, Jardiance  [empagliflozin ], and Sulfa antibiotics   History: Past Medical History:  Diagnosis Date   Anxiety state, unspecified    Chest pain, unspecified  Fibroid    HTN (hypertension)    Hypercholesterolemia    Obesity    Type II or unspecified type diabetes mellitus without mention of complication, not stated as uncontrolled    Unspecified hemorrhagic conditions    Unspecified menopausal and postmenopausal disorder    Past Surgical History:  Procedure Laterality Date   ABDOMINAL HYSTERECTOMY  1983   TAH BSO   BREAST BIOPSY Left    CATARACT  EXTRACTION, BILATERAL     CHOLECYSTECTOMY     COLONOSCOPY  04/16/2014   EYE SURGERY  05/19/2014   FOOT SURGERY     MOUTH SURGERY     OOPHORECTOMY     BSO   OVARIAN CYST SURGERY     SALPINGOOPHORECTOMY     Family History  Problem Relation Age of Onset   Uterine cancer Mother    Diabetes Father    Heart disease Father    Diabetes Maternal Grandmother    Diabetes Maternal Grandfather    Early death Neg Hx    Kidney disease Neg Hx    Hyperlipidemia Neg Hx    Stroke Neg Hx    Colon cancer Neg Hx    Social History   Socioeconomic History   Marital status: Divorced    Spouse name: Not on file   Number of children: Not on file   Years of education: Not on file   Highest education level: Some college, no degree  Occupational History   Not on file  Tobacco Use   Smoking status: Former   Smokeless tobacco: Never  Vaping Use   Vaping status: Never Used  Substance and Sexual Activity   Alcohol use: No    Comment: Rare   Drug use: No   Sexual activity: Never    Birth control/protection: Surgical  Other Topics Concern   Not on file  Social History Narrative   Not on file   Social Drivers of Health   Financial Resource Strain: Low Risk  (10/12/2023)   Overall Financial Resource Strain (CARDIA)    Difficulty of Paying Living Expenses: Not hard at all  Food Insecurity: No Food Insecurity (10/12/2023)   Hunger Vital Sign    Worried About Running Out of Food in the Last Year: Never true    Ran Out of Food in the Last Year: Never true  Transportation Needs: No Transportation Needs (10/12/2023)   PRAPARE - Administrator, Civil Service (Medical): No    Lack of Transportation (Non-Medical): No  Physical Activity: Insufficiently Active (10/12/2023)   Exercise Vital Sign    Days of Exercise per Week: 7 days    Minutes of Exercise per Session: 20 min  Stress: Stress Concern Present (10/12/2023)   [employer] of Occupational Health - Occupational Stress Questionnaire     Feeling of Stress : To some extent  Social Connections: Socially Integrated (10/12/2023)   Social Connection and Isolation Panel [NHANES]    Frequency of Communication with Friends and Family: More than three times a week    Frequency of Social Gatherings with Friends and Family: Three times a week    Attends Religious Services: More than 4 times per year    Active Member of Clubs or Organizations: Yes    Attends Banker Meetings: More than 4 times per year    Marital Status: Married    Tobacco Counseling Counseling given: Not Answered   Clinical Intake:  Pre-visit preparation completed: Yes  Pain : No/denies pain  Nutritional Risks: None Diabetes: Yes CBG done?: No Did pt. bring in CBG monitor from home?: No  How often do you need to have someone help you when you read instructions, pamphlets, or other written materials from your doctor or pharmacy?: 1 - Never  Interpreter Needed?: No  Information entered by :: NAllen LPN   Activities of Daily Living    10/12/2023    3:09 PM 09/20/2023   10:16 AM  In your present state of health, do you have any difficulty performing the following activities:  Hearing? 0 0  Vision? 0 0  Difficulty concentrating or making decisions? 0 0  Walking or climbing stairs? 0 0  Dressing or bathing? 0 0  Doing errands, shopping? 0 0  Preparing Food and eating ? N N  Using the Toilet? N N  In the past six months, have you accidently leaked urine? N Y  Do you have problems with loss of bowel control? N N  Managing your Medications? N N  Managing your Finances? N N  Housekeeping or managing your Housekeeping? N N    Patient Care Team: Joshua Debby CROME, MD as PCP - General (Internal Medicine)  Indicate any recent Medical Services you may have received from other than [facility] providers in the past year (date may be approximate).     Assessment:   This is a routine wellness examination for [facility].  Hearing/Vision  screen Hearing Screening - Comments:: Denies hearing issues Vision Screening - Comments:: Regular eye exams,    Goals Addressed             This Visit's Progress    Patient Stated       10/12/2023, wants to find right exercise to do regularly       Depression Screen    10/12/2023    3:38 PM 12/12/2022    3:23 PM 06/07/2022   11:59 AM 04/18/2022    2:40 PM 09/29/2021    1:21 PM 06/02/2020    1:30 PM 04/23/2019    4:19 PM  PHQ 2/9 Scores  PHQ - 2 Score 1 0 0 0 0 0 0  PHQ- 9 Score  0 0 0       Fall Risk    10/12/2023    3:36 PM 09/20/2023   10:16 AM 12/12/2022    3:23 PM 06/07/2022   11:28 AM 09/29/2021    1:21 PM  Fall Risk   Falls in the past year? 0 0 0 0 0  Number falls in past yr: 0 0 0 0   Injury with Fall? 0 0 0 0   Risk for fall due to : Medication side effect  No Fall Risks No Fall Risks   Follow up Falls prevention discussed;Falls evaluation completed  Falls evaluation completed Falls prevention discussed     MEDICARE RISK AT HOME: Medicare Risk at Home Any stairs in or around the home?: Yes If so, are there any without handrails?: No Home free of loose throw rugs in walkways, pet beds, electrical cords, etc?: Yes Adequate lighting in your home to reduce risk of falls?: Yes Life alert?: No Use of a cane, walker or w/c?: No Grab bars in the bathroom?: No Shower chair or bench in shower?: No Elevated toilet seat or a handicapped toilet?: No  TIMED UP AND GO:  Was the test performed?  No    Cognitive Function:        10/12/2023    3:41 PM 06/07/2022   12:09  PM  6CIT Screen  What Year? 0 points 0 points  What month? 0 points 0 points  What time? 0 points 0 points  Count back from 20 0 points 0 points  Months in reverse 0 points 0 points  Repeat phrase 0 points 0 points  Total Score 0 points 0 points    Immunizations Immunization History  Administered Date(s) Administered   Fluad Quad(high Dose 65+) 06/20/2019, 06/26/2023   Influenza Split  07/30/2014   Influenza Whole 10/23/2012   Influenza, High Dose Seasonal PF 08/07/2016, 07/26/2017, 07/25/2018   Influenza,inj,Quad PF,6+ Mos 06/02/2020   Influenza-Unspecified 07/10/2015, 07/15/2021, 07/21/2022   PFIZER(Purple Top)SARS-COV-2 Vaccination 10/25/2019, 11/13/2019, 07/14/2020, 03/15/2021   Pfizer Covid-19 Vaccine Bivalent Booster 63yrs & up 07/27/2021, 07/21/2022   Pfizer(Comirnaty)Fall Seasonal Vaccine 12 years and older 06/06/2023   Pneumococcal Conjugate-13 05/03/2015   Pneumococcal Polysaccharide-23 08/01/2012, 06/20/2019   Tdap 06/29/2011, 06/06/2021   Zoster Recombinant(Shingrix ) 04/28/2021, 09/05/2021    TDAP status: Up to date  Flu Vaccine status: Up to date  Pneumococcal vaccine status: Up to date  Covid-19 vaccine status: Completed vaccines  Qualifies for Shingles Vaccine? Yes   Zostavax completed Yes   Shingrix  Completed?: Yes  Screening Tests Health Maintenance  Topic Date Due   Diabetic kidney evaluation - Urine ACR  12/12/2023   FOOT EXAM  12/12/2023   HEMOGLOBIN A1C  12/23/2023   OPHTHALMOLOGY EXAM  03/20/2024   Diabetic kidney evaluation - eGFR measurement  06/24/2024   Medicare Annual Wellness (AWV)  10/11/2024   DTaP/Tdap/Td (3 - Td or Tdap) 06/07/2031   Pneumonia Vaccine 11+ Years old  Completed   INFLUENZA VACCINE  Completed   DEXA SCAN  Completed   COVID-19 Vaccine  Completed   Hepatitis C Screening  Completed   Zoster Vaccines- Shingrix   Completed   HPV VACCINES  Aged Out   Colonoscopy  Discontinued    Health Maintenance  There are no preventive care reminders to display for this patient.   Colorectal cancer screening: No longer required.   Mammogram status: scheduled for 10/23/2023  Bone Density status: Completed 06/20/2022.   Lung Cancer Screening: (Low Dose CT Chest recommended if Age 70-80 years, 20 pack-year currently smoking OR have quit w/in 15years.) does not qualify.   Lung Cancer Screening Referral: no  Additional  Screening:  Hepatitis C Screening: does qualify; Completed 10/27/2015  Vision Screening: Recommended annual ophthalmology exams for early detection of glaucoma and other disorders of the eye. Is the patient up to date with their annual eye exam?  Yes  Who is the provider or what is the name of the office in which the patient attends annual eye exams? Will be seeing [facility] If pt is not established with a provider, would they like to be referred to a provider to establish care? No .   Dental Screening: Recommended annual dental exams for proper oral hygiene  Diabetic Foot Exam: Diabetic Foot Exam: Completed 03/21/2023  Community Resource Referral / Chronic Care Management: CRR required this visit?  No   CCM required this visit?  No     Plan:     I have personally reviewed and noted the following in the patient's chart:   Medical and social history Use of alcohol, tobacco or illicit drugs  Current medications and supplements including opioid prescriptions. Patient is not currently taking opioid prescriptions. Functional ability and status Nutritional status Physical activity Advanced directives List of other physicians Hospitalizations, surgeries, and ER visits in previous 12 months Vitals Screenings to  include cognitive, depression, and falls Referrals and appointments  In addition, I have reviewed and discussed with patient certain preventive protocols, quality metrics, and best practice recommendations. A written personalized care plan for preventive services as well as general preventive health recommendations were provided to patient.     Ardella FORBES Dawn, LPN   05/14/7973   After Visit Summary: (MyChart) Due to this being a telephonic visit, the after visit summary with patients personalized plan was offered to patient via MyChart   Nurse Notes: Patient wants to know if nutrofol works for hair loss and would like to know if trulicity  needs to be decreased. Message sent to  Dr. Joshua with patient's questions.

## 2023-10-12 NOTE — Patient Instructions (Signed)
 Sydney Avila , Thank you for taking time to come for your Medicare Wellness Visit. I appreciate your ongoing commitment to your health goals. Please review the following plan we discussed and let me know if I can assist you in the future.   Referrals/Orders/Follow-Ups/Clinician Recommendations: none  This is a list of the screening recommended for you and due dates:  Health Maintenance  Topic Date Due   Yearly kidney health urinalysis for diabetes  12/12/2023   Complete foot exam   12/12/2023   Hemoglobin A1C  12/23/2023   Eye exam for diabetics  03/20/2024   Yearly kidney function blood test for diabetes  06/24/2024   Medicare Annual Wellness Visit  10/11/2024   DTaP/Tdap/Td vaccine (3 - Td or Tdap) 06/07/2031   Pneumonia Vaccine  Completed   Flu Shot  Completed   DEXA scan (bone density measurement)  Completed   COVID-19 Vaccine  Completed   Hepatitis C Screening  Completed   Zoster (Shingles) Vaccine  Completed   HPV Vaccine  Aged Out   Colon Cancer Screening  Discontinued    Advanced directives: (In Chart) A copy of your advanced directives are scanned into your chart should your provider ever need it.  Next Medicare Annual Wellness Visit scheduled for next year: No  Insert Preventive Care attachment Insert FALL PREVENTION attachment if needed

## 2023-10-15 ENCOUNTER — Telehealth: Payer: Self-pay | Admitting: Internal Medicine

## 2023-10-15 NOTE — Telephone Encounter (Signed)
 COPIED FROM CRM  Reason for CRM: Patient called in regarding to paperwork that need to be faxed she is requesting that the once the forms the has been faxed if she can get a call to come pick those up when they are available [phone]

## 2023-10-15 NOTE — Telephone Encounter (Signed)
 Patient dropped off document Patient Assistance Application, to be filled out by provider. Patient requested to send it back via Fax within ASAP. Document is located in providers tray at front office.Please advise at Mobile [phone] (mobile)   Patient was informed the paperwork was due on 10/09/2023, but she did not receive it until 10/13/2023, so she would like to know if it can be expedited. She would also like a copy for herself in addition to the fax.

## 2023-10-16 ENCOUNTER — Other Ambulatory Visit: Payer: Self-pay

## 2023-10-16 DIAGNOSIS — E118 Type 2 diabetes mellitus with unspecified complications: Secondary | ICD-10-CM

## 2023-10-16 MED ORDER — TRULICITY 1.5 MG/0.5ML ~~LOC~~ SOAJ: 1.5000 mg | SUBCUTANEOUS | 1 refills | Status: DC

## 2023-10-16 NOTE — Telephone Encounter (Signed)
 Have spoken with the patient ad advised her that I will fill out her paperwork asap and fax it over, then mail a copy to her. She gave a verbal understanding.

## 2023-10-22 NOTE — Telephone Encounter (Signed)
 Reason for CRM: Patient states she is still waiting for confirmation that paper work has been faxed to [employer] for her Trulicity

## 2023-10-23 ENCOUNTER — Ambulatory Visit
Admission: RE | Admit: 2023-10-23 | Discharge: 2023-10-23 | Disposition: A | Discharge status: Home / Self Care | Payer: PPO | Source: Ambulatory Visit | Attending: Internal Medicine | Admitting: Internal Medicine

## 2023-10-23 DIAGNOSIS — Z1231 Encounter for screening mammogram for malignant neoplasm of breast: Secondary | ICD-10-CM | POA: Diagnosis not present

## 2023-11-01 ENCOUNTER — Ambulatory Visit: Payer: Self-pay | Admitting: Internal Medicine

## 2023-11-01 NOTE — Telephone Encounter (Signed)
Copied from CRM [id number]. Topic: Appointments - Appointment Scheduling >> Nov 01, 2023  8:38 AM [place] A wrote: Patient has sore throat for three days pain level 9 for pain   Chief Complaint: Sore Throat Symptoms: Sore Throat, Fatigue, Runny Nose, Cough Frequency: 2-3 Days Pertinent Negatives: Patient denies chest pain, shortness of breath Disposition: [] ED /[] Urgent Care (no appt availability in office) / [x] Appointment(In office/virtual)/ []  [facility]/ [] Home Care/ [] Refused Recommended Disposition /[] [place] [facility]/ []  Follow-up with PCP Additional Notes: Sydney Avila is a 78 year old female being triaged for a sore throat for a couple of days. The patient states she has a fever because she has been taking Tylenol, but denies the use of a thermometer.The patient reports a runny nose, cough, and fatigue. Reports being around a neighbor who had a runny nose. The patient states she has been able to eat soft foods. Denies being able to feel lymph node enlargement with instruction over the phone. In office appointment made for tomorrow.    Reason for Disposition  [1] Sore throat with cough/cold symptoms AND [2] present > 5 days  Answer Assessment - Initial Assessment Questions 1. ONSET: "When did the throat start hurting?" (Hours or days ago)      Couple of days ago  2. SEVERITY: "How bad is the sore throat?" (Scale 1-10; mild, moderate or severe)   - MILD (1-3):  Doesn't interfere with eating or normal activities.   - MODERATE (4-7): Interferes with eating some solids and normal activities.   - SEVERE (8-10):  Excruciating pain, interferes with most normal activities.   - SEVERE WITH DYSPHAGIA (10): Can't swallow liquids, drooling.     Moderate  3. STREP EXPOSURE: "Has there been any exposure to strep within the past week?" If Yes, ask: "What type of contact occurred?"      Unsure, neighbor was sick.   4.  VIRAL SYMPTOMS: "Are there any symptoms of a cold, such as a  runny nose, cough, hoarse voice or red eyes?"      Runny Nose, Cough, Hoarse  5. FEVER: "Do you have a fever?" If Yes, ask: "What is your temperature, how was it measured, and when did it start?"     Yes, unmeasured, feels warm.  6. PUS ON THE TONSILS: "Is there pus on the tonsils in the back of your throat?"     No  7. OTHER SYMPTOMS: "Do you have any other symptoms?" (e.g., difficulty breathing, headache, rash)     Chills  8. PREGNANCY: "Is there any chance you are pregnant?" "When was your last menstrual period?"     No  Protocols used: Sore Throat-A-AH

## 2023-11-02 ENCOUNTER — Ambulatory Visit (INDEPENDENT_AMBULATORY_CARE_PROVIDER_SITE_OTHER): Payer: PPO | Admitting: Internal Medicine

## 2023-11-02 VITALS — BP 118/82 | HR 92 | Temp 98.9°F | Ht 62.5 in | Wt 146.0 lb

## 2023-11-02 DIAGNOSIS — J029 Acute pharyngitis, unspecified: Secondary | ICD-10-CM

## 2023-11-02 LAB — POCT RAPID STREP A (OFFICE): Rapid Strep A Screen: NEGATIVE

## 2023-11-02 MED ORDER — LIDOCAINE VISCOUS HCL 2 % MT SOLN: 15.0000 mL | Freq: Three times a day (TID) | OROMUCOSAL | 0 refills | Status: DC

## 2023-11-02 NOTE — Assessment & Plan Note (Signed)
POC strep test done which is negative. Given clinical symptoms culture not needed. Suspect acute viral illness. She is about 3-4 days into symptoms. Reassurance given and rx lidocaine viscous for sore throat. Advised to take claritin she has at home to help.

## 2023-11-02 NOTE — Progress Notes (Signed)
   Subjective:   Patient ID: Sydney Avila, female    DOB: 08-29-1946, 78 y.o.   MRN: [id number]  Sore Throat  Associated symptoms include congestion and coughing. Pertinent negatives include no ear discharge, ear pain, shortness of breath or trouble swallowing.   The patient is a 78 YO female coming in for sore throat 3-4 days and cough 1 day. Her sore throat is extremely bad.   Review of Systems  Constitutional:  Positive for activity change and appetite change. Negative for chills, fatigue, fever and unexpected weight change.  HENT:  Positive for congestion, postnasal drip and rhinorrhea. Negative for ear discharge, ear pain, sinus pressure, sinus pain, sneezing, sore throat, tinnitus, trouble swallowing and voice change.   Eyes: Negative.   Respiratory:  Positive for cough. Negative for chest tightness, shortness of breath and wheezing.   Cardiovascular: Negative.   Gastrointestinal: Negative.   Musculoskeletal:  Negative for myalgias.  Neurological: Negative.     Objective:  Physical Exam Constitutional:      Appearance: She is well-developed.  HENT:     Head: Normocephalic and atraumatic.     Comments: Oropharynx with redness and clear drainage, nose with swollen turbinates, TMs normal bilaterally.  Neck:     Thyroid: No thyromegaly.  Cardiovascular:     Rate and Rhythm: Normal rate and regular rhythm.  Pulmonary:     Effort: Pulmonary effort is normal. No respiratory distress.     Breath sounds: Normal breath sounds. No wheezing or rales.  Abdominal:     Palpations: Abdomen is soft.  Musculoskeletal:        General: No tenderness.     Cervical back: Normal range of motion.  Lymphadenopathy:     Cervical: No cervical adenopathy.  Skin:    General: Skin is warm and dry.  Neurological:     Mental Status: She is alert and oriented to person, place, and time.     Vitals:   11/02/23 1137  BP: 118/82  Pulse: 92  Temp: 98.9 F (37.2 C)  TempSrc: Oral  SpO2: 98%   Weight: 146 lb (66.2 kg)  Height: 5' 2.5" (1.588 m)    Assessment & Plan:

## 2023-11-02 NOTE — Patient Instructions (Addendum)
You should take claritin or zyrtec over the counter daily to help.   We have sent in lidocaine to use 5 mL before eating to help the throat not hurt.   This is likely a virus and you should improve within 10 days of this starting.

## 2023-11-05 ENCOUNTER — Ambulatory Visit: Payer: Self-pay | Admitting: Internal Medicine

## 2023-11-05 NOTE — Telephone Encounter (Signed)
Copied from CRM [id number]. Topic: Clinical - Prescription Issue >> Nov 05, 2023  9:59 AM Irine Seal wrote: Reason for CRM: lidocaine (XYLOCAINE) 2 % solution, patient is having difficulty swallowing the medication,  she stated it is too thick and gagging her. She is taking Claritin over the counter, eating soft foods and warm broths, her sore throat is still present. She is wanting to see if there is anything in tablet form that she can be sent in for her sore throat. Patient callback [phone]

## 2023-11-09 ENCOUNTER — Ambulatory Visit: Payer: Self-pay | Admitting: Internal Medicine

## 2023-11-09 NOTE — Telephone Encounter (Signed)
This RN attempted return call to patient. No answer, left voicemail.

## 2023-11-09 NOTE — Telephone Encounter (Signed)
Patient called back, stating her throat is still bothering her. Would like an oral medication if possible.

## 2023-11-09 NOTE — Telephone Encounter (Signed)
 LVM for patient to return call.

## 2023-11-09 NOTE — Telephone Encounter (Signed)
Chief Complaint: sore throat Symptoms: 6/10 throat pain Frequency: Initial onset of symptoms 1/20 Pertinent Negatives: Patient denies difficulty breathing or inability to swallow Disposition: [] ED /[x] Urgent Care (no appt availability in office) / [] Appointment(In office/virtual)/ []  [facility]/ [] Home Care/ [] Refused Recommended Disposition /[] [facility]/ []  Follow-up with PCP Additional Notes: Pt reports sore throat. Pt initially seen for sore throat in office on 1/24. Pt tested neg for strep and was prescribed viscous lidocaine. Pt states she has not been able to take that, states it is very difficult to swallow and makes her gag. Pt states she has been drinking tea and eating soup. Pt states she can swallow. Pt states "I've had sore throats all my life, they prescribe me antibiotics for 5 days and I get better." Pt states she needs antibiotics. Pt denies fever, denies N/V/D, cough. Endorses runny nose with clear drainage. Denies difficulty breathing. Rates her pain 6/10 but also states it's severe. For severe throat pain, pt advised to be seen within 24 hrs. Pt scheduled to be seen in-person at [facility] tomorrow at 12. RN confirmed which location would be closest to pt's home and scheduled the visit. Pt verbalized understanding. RN advised pt needs to call 911 if she has trouble breathing or becomes unable to swallow or worsens in any way. Pt verbalized understanding.   Reason for Disposition  SEVERE (e.g., excruciating) throat pain  Answer Assessment - Initial Assessment Questions 1. ONSET: "When did the throat start hurting?" (Hours or days ago)      Seen on 1/24. Suspect viral illness. Strep was negative. Prescribed viscous lidocaine. States she couldn't swallow it. "Couldn't make it go down - I was gagging."  2. SEVERITY: "How bad is the sore throat?" (Scale 1-10; mild, moderate or severe)   - MILD (1-3):  Doesn't interfere with eating or normal activities.   - MODERATE  (4-7): Interferes with eating some solids and normal activities.   - SEVERE (8-10):  Excruciating pain, interferes with most normal activities.   - SEVERE WITH DYSPHAGIA (10): Can't swallow liquids, drooling.     6/10. When I wake up in the AM, "I have got to get up and get something to drink to swallow, and sometimes then, the first thing to go down doesn't hurt. I have been eating a lot of soup. "I have been drinking tea with artificial sweetener in it." Pt states she is not unable to swallow, sometimes my mouth is stuck together and I realize I haven't been drinking. 3. STREP EXPOSURE: "Has there been any exposure to strep within the past week?" If Yes, ask: "What type of contact occurred?"      Strep negative. 4.  VIRAL SYMPTOMS: "Are there any symptoms of a cold, such as a runny nose, cough, hoarse voice or red eyes?"      "The nose will run. It is running clear." 5. FEVER: "Do you have a fever?" If Yes, ask: "What is your temperature, how was it measured, and when did it start?"     "I've been taking Tylenol ever 6 hrs. There are times I get hot but I thought it was just a hot flash." 6. PUS ON THE TONSILS: "Is there pus on the tonsils in the back of your throat?"     "I don't have tonsils, they're gone." 7. OTHER SYMPTOMS: "Do you have any other symptoms?" (e.g., difficulty breathing, headache, rash)     Pt states "I have had sore throats all my life." "  I got a call this morning because I called and told them about the lidocaine." "I didn't get a call back til today." "All I need is an antibiotic, we've got to get over this sore throat." "I've been taking Claritin and I do have throat lozenges, but that is not curing it." Pt endorses fatigue. "I have 2 flights of steps I go up and down every day - I am 58 yrs old but I don't feel it." "Sore throat ever since I was a kid." Denies difficulty breathing - "I don't think so" -"When I hustled up the steps, normally my BP is 144/73, but it was 150."  "Right now I have a sore spot where I tried to move some furniture." Denies blurred vision, denies dizziness.  Protocols used: Sore Throat-A-AH

## 2023-11-09 NOTE — Telephone Encounter (Unsigned)
Copied from CRM [id number]. Topic: Clinical - Pink Word Triage >> Nov 09, 2023  3:50 PM Fredrich Romans wrote: Reason for Triage: patient is needing a different medication for her throat pain.She was prescribed lidocaine,that she is having a hard time swallowing and would like to know if she could have any medication sent in a pill form.

## 2023-11-10 ENCOUNTER — Ambulatory Visit
Admission: RE | Admit: 2023-11-10 | Discharge: 2023-11-10 | Disposition: A | Discharge status: Home / Self Care | Payer: Self-pay | Source: Ambulatory Visit | Attending: Family Medicine | Admitting: Family Medicine

## 2023-11-10 VITALS — BP 180/77 | HR 78 | Temp 98.7°F | Resp 16

## 2023-11-10 DIAGNOSIS — J069 Acute upper respiratory infection, unspecified: Secondary | ICD-10-CM

## 2023-11-10 MED ORDER — CEFDINIR 300 MG PO CAPS: 300.0000 mg | ORAL_CAPSULE | Freq: Two times a day (BID) | ORAL | 0 refills | Status: DC

## 2023-11-10 NOTE — Discharge Instructions (Addendum)
We will manage this as an acute upper respiratory infection with cefdinir. For sore throat or cough try using a honey-based tea. Use 3 teaspoons of honey with juice squeezed from half lemon. Place shaved pieces of ginger into 1/2-1 cup of water and warm over stove top. Then mix the ingredients and repeat every 4 hours as needed. Please take Tylenol 500mg -650mg  every 6 hours for throat pain, fevers, aches and pains. Hydrate very well with at least 2 liters of water. Eat light meals such as soups (chicken and noodles, vegetable, chicken and wild rice).  Do not eat foods that you are allergic to.  Taking an antihistamine like Zyrtec can help against postnasal drainage, sinus congestion which can cause sinus pain, sinus headaches, throat pain, painful swallowing, coughing.  You can take this together cough medication as needed.

## 2023-11-10 NOTE — ED Triage Notes (Signed)
Pt reports sore throat, itching ears, runny nose  x 10 days. Pt was prescribed lidocaine and she gagged when she used it. Claritin gives some relief.

## 2023-11-10 NOTE — ED Provider Notes (Signed)
[facility]  Note:  This document was prepared using Conservation officer, historic buildings and may include unintentional dictation errors.  MRN: [id number] DOB: 1946/09/16  Subjective:   Sydney Avila is a 78 y.o. female presenting for 10-day history of acute onset persistent throat pain, runny and stuffy nose, drainage, bilateral ear itching.  Was seen by her PCP and has used supportive care without much relief.  Has also had a cough but no chest pain, shortness of breath or wheezing.  No asthma.  No current facility-administered medications for this encounter.  Current Outpatient Medications:    loratadine (CLARITIN) 10 MG tablet, Take 10 mg by mouth daily., Disp: , Rfl:    ALPRAZolam (XANAX) 0.5 MG tablet, TAKE ONE TABLET BY MOUTH TWICE A DAY AS NEEDED FOR ANXIETY, Disp: 45 tablet, Rfl: 2   blood glucose meter kit and supplies, Dispense based on patient and insurance preference. Use up to 2 times daily as directed. (FOR ICD-10 E10.9, E11.9)., Disp: 1 each, Rfl: 0   cholestyramine (QUESTRAN) 4 g packet, TAKE 1 PACKET BY MOUTH 3 TIMES A DAY WITH MEALS, Disp: 60 packet, Rfl: 0   clotrimazole (MYCELEX) 10 MG troche, Take 1 tablet (10 mg total) by mouth 3 (three) times daily., Disp: 70 Troche, Rfl: 1   Dulaglutide (TRULICITY) 1.5 MG/0.5ML SOAJ, Inject 1.5 mg into the skin once a week., Disp: 6 mL, Rfl: 1   fluocinonide cream (LIDEX) 0.05 %, Apply 1 Application topically 2 (two) times daily., Disp: 120 g, Rfl: 1   Lancets (ONETOUCH DELICA PLUS LANCET33G) MISC, Use to check blood sugars twice daily. DX: E11.9, Disp: 200 each, Rfl: 3   lidocaine (XYLOCAINE) 2 % solution, Use as directed 15 mLs in the mouth or throat 3 (three) times daily before meals., Disp: 100 mL, Rfl: 0   lisinopril (ZESTRIL) 20 MG tablet, Take 1 tablet (20 mg total) by mouth daily., Disp: 90 tablet, Rfl: 1   metFORMIN (GLUCOPHAGE-XR) 500 MG 24 hr tablet, TAKE 2 TABLETS BY MOUTH TWICE A DAY, Disp: 240  tablet, Rfl: 0   NEOMYCIN-POLYMYXIN-HYDROCORTISONE (CORTISPORIN) 1 % SOLN OTIC solution, PLACE 3 DROPS INTO THE LEFT EAR EVERY 8 (EIGHT) HOURS., Disp: 10 mL, Rfl: 0   simvastatin (ZOCOR) 10 MG tablet, TAKE 1 TABLET BY MOUTH EVERY NIGHT AT BEDTIME, Disp: 90 tablet, Rfl: 0   Allergies  Allergen Reactions   Amoxicillin     Upset GI per pt account   Gabapentin Itching   Jardiance [Empagliflozin]     Hair loss and rash reported by patient   Sulfa Antibiotics Other (See Comments)    Past Medical History:  Diagnosis Date   Anxiety state, unspecified    Chest pain, unspecified    Fibroid    HTN (hypertension)    Hypercholesterolemia    Obesity    Type II or unspecified type diabetes mellitus without mention of complication, not stated as uncontrolled    Unspecified hemorrhagic conditions    Unspecified menopausal and postmenopausal disorder      Past Surgical History:  Procedure Laterality Date   ABDOMINAL HYSTERECTOMY  1983   TAH BSO   BREAST BIOPSY Left    CATARACT EXTRACTION, BILATERAL     CHOLECYSTECTOMY     COLONOSCOPY  04/16/2014   EYE SURGERY  05/19/2014   FOOT SURGERY     MOUTH SURGERY     OOPHORECTOMY     BSO   OVARIAN CYST SURGERY     SALPINGOOPHORECTOMY  Family History  Problem Relation Age of Onset   Uterine cancer Mother    Diabetes Father    Heart disease Father    Diabetes Maternal Grandmother    Diabetes Maternal Grandfather    Early death Neg Hx    Kidney disease Neg Hx    Hyperlipidemia Neg Hx    Stroke Neg Hx    Colon cancer Neg Hx     Social History   Tobacco Use   Smoking status: Former   Smokeless tobacco: Never  Advertising account planner   Vaping status: Never Used  Substance Use Topics   Alcohol use: No    Comment: Rare   Drug use: No    ROS   Objective:   Vitals: BP (!) 180/77 (BP Location: Left Arm)   Pulse 78   Temp 98.7 F (37.1 C) (Oral)   Resp 16   SpO2 97%   Physical Exam Constitutional:      General: She is not in acute  distress.    Appearance: Normal appearance. She is well-developed and normal weight. She is not ill-appearing, toxic-appearing or diaphoretic.  HENT:     Head: Normocephalic and atraumatic.     Right Ear: Tympanic membrane, ear canal and external ear normal. No drainage or tenderness. No middle ear effusion. There is no impacted cerumen. Tympanic membrane is not erythematous or bulging.     Left Ear: Tympanic membrane, ear canal and external ear normal. No drainage or tenderness.  No middle ear effusion. There is no impacted cerumen. Tympanic membrane is not erythematous or bulging.     Nose: Congestion and rhinorrhea present.     Mouth/Throat:     Mouth: Mucous membranes are moist. No oral lesions.     Pharynx: Posterior oropharyngeal erythema (with associated post-nasal drainage) present. No pharyngeal swelling, oropharyngeal exudate or uvula swelling.     Tonsils: No tonsillar exudate or tonsillar abscesses.  Eyes:     General: No scleral icterus.       Right eye: No discharge.        Left eye: No discharge.     Extraocular Movements: Extraocular movements intact.     Right eye: Normal extraocular motion.     Left eye: Normal extraocular motion.     Conjunctiva/sclera: Conjunctivae normal.  Cardiovascular:     Rate and Rhythm: Normal rate and regular rhythm.     Heart sounds: Normal heart sounds. No murmur heard.    No friction rub. No gallop.  Pulmonary:     Effort: Pulmonary effort is normal. No respiratory distress.     Breath sounds: No stridor. No wheezing, rhonchi or rales.  Chest:     Chest wall: No tenderness.  Musculoskeletal:     Cervical back: Normal range of motion and neck supple.  Lymphadenopathy:     Cervical: No cervical adenopathy.  Skin:    General: Skin is warm and dry.  Neurological:     General: No focal deficit present.     Mental Status: She is alert and oriented to person, place, and time.  Psychiatric:        Mood and Affect: Mood normal.         Behavior: Behavior normal.     Assessment and Plan :   PDMP not reviewed this encounter.  1. Acute upper respiratory infection    Will address an acute upper respiratory infection with cefdinir.  Creatinine clearance 55 mL/min.  Monitor for blood pressure, suspect that it is elevated  secondary to her upper respiratory infection.  Recheck with PCP.  Deferred imaging given clear cardiopulmonary exam, hemodynamically stable vital signs. Counseled patient on potential for adverse effects with medications prescribed/recommended today, ER and return-to-clinic precautions discussed, patient verbalized understanding.    Wallis Bamberg, [place] 11/10/23 1242

## 2023-11-19 ENCOUNTER — Telehealth: Payer: Self-pay

## 2023-11-19 MED ORDER — FLUCONAZOLE 150 MG PO TABS: 150.0000 mg | ORAL_TABLET | ORAL | 0 refills | Status: AC

## 2023-11-19 NOTE — Telephone Encounter (Signed)
 Pt called clinic requesting rx med for yeast infection post abx use-chart reviewed by Pinkey Brier, PA-C-rx fluconazole  150mg  every 3 days disp 2 no refills-pt notified and pharmacy verified

## 2023-11-22 ENCOUNTER — Other Ambulatory Visit: Payer: Self-pay | Admitting: Internal Medicine

## 2023-11-22 DIAGNOSIS — E118 Type 2 diabetes mellitus with unspecified complications: Secondary | ICD-10-CM

## 2023-11-22 DIAGNOSIS — I10 Essential (primary) hypertension: Secondary | ICD-10-CM

## 2023-11-22 DIAGNOSIS — E1121 Type 2 diabetes mellitus with diabetic nephropathy: Secondary | ICD-10-CM

## 2023-12-16 ENCOUNTER — Other Ambulatory Visit: Payer: Self-pay | Admitting: Internal Medicine

## 2023-12-16 DIAGNOSIS — E118 Type 2 diabetes mellitus with unspecified complications: Secondary | ICD-10-CM

## 2023-12-18 ENCOUNTER — Other Ambulatory Visit: Payer: Self-pay | Admitting: Internal Medicine

## 2023-12-18 DIAGNOSIS — K9089 Other intestinal malabsorption: Secondary | ICD-10-CM

## 2024-01-09 ENCOUNTER — Ambulatory Visit (INDEPENDENT_AMBULATORY_CARE_PROVIDER_SITE_OTHER): Admitting: Family Medicine

## 2024-01-09 ENCOUNTER — Encounter: Payer: Self-pay | Admitting: Family Medicine

## 2024-01-09 VITALS — BP 160/80 | HR 85 | Temp 98.5°F | Ht 62.5 in | Wt 140.0 lb

## 2024-01-09 DIAGNOSIS — R509 Fever, unspecified: Secondary | ICD-10-CM

## 2024-01-09 DIAGNOSIS — B9689 Other specified bacterial agents as the cause of diseases classified elsewhere: Secondary | ICD-10-CM

## 2024-01-09 DIAGNOSIS — J029 Acute pharyngitis, unspecified: Secondary | ICD-10-CM | POA: Diagnosis not present

## 2024-01-09 DIAGNOSIS — J069 Acute upper respiratory infection, unspecified: Secondary | ICD-10-CM | POA: Diagnosis not present

## 2024-01-09 MED ORDER — FLUCONAZOLE 150 MG PO TABS: 150.0000 mg | ORAL_TABLET | Freq: Once | ORAL | 0 refills | Status: AC

## 2024-01-09 MED ORDER — DOXYCYCLINE HYCLATE 100 MG PO TABS: 100.0000 mg | ORAL_TABLET | Freq: Two times a day (BID) | ORAL | 0 refills | Status: AC

## 2024-01-09 NOTE — Progress Notes (Signed)
 Acute Office Visit  Subjective:     Patient ID: Sydney Avila, female    DOB: Jan 24, 1946, 78 y.o.   MRN: [id number]  Chief Complaint  Patient presents with   Acute Visit    Ongoing for about a week, is having low grade fevers, sore throat, losing her voice, semi productive cough    HPI Patient is in today for evaluation of cough, hoarse voice, low grade fevers, sore throat, fatigue, for the last 7 days. Has tried tylenol for fever with relief.  Denies known sick contacts. Has been outside in her garden over the last 10 days. Denies abdominal pain, nausea, vomiting, diarrhea, rash, fever, chills, other symptoms.  Medical hx as outlined below.  ROS Per HPI      Objective:    BP (!) 160/80 (BP Location: Left Arm, Patient Position: Sitting)   Pulse 85   Temp 98.5 F (36.9 C) (Temporal)   Ht 5' 2.5" (1.588 m)   Wt 140 lb (63.5 kg)   SpO2 95%   BMI 25.20 kg/m    Physical Exam Vitals and nursing note reviewed.  Constitutional:      General: She is not in acute distress.    Comments: Appears fatigued  HENT:     Head: Normocephalic and atraumatic.     Right Ear: External ear normal.     Left Ear: External ear normal.     Nose: Congestion and rhinorrhea present.     Mouth/Throat:     Mouth: Mucous membranes are moist.     Pharynx: No oropharyngeal exudate or posterior oropharyngeal erythema.     Comments: Oropharyngeal cobblestoning   Eyes:     Extraocular Movements: Extraocular movements intact.  Cardiovascular:     Rate and Rhythm: Normal rate and regular rhythm.     Heart sounds: Murmur heard.  Pulmonary:     Effort: Pulmonary effort is normal. No respiratory distress.     Breath sounds: No wheezing, rhonchi or rales.  Musculoskeletal:     Cervical back: Normal range of motion and neck supple.  Lymphadenopathy:     Cervical: Cervical adenopathy present.  Skin:    General: Skin is warm and dry.  Neurological:     General: No focal deficit present.      Mental Status: She is alert and oriented to person, place, and time.    No results found for any visits on 01/09/24.      Assessment & Plan:   Bacterial URI -     Doxycycline Hyclate; Take 1 tablet (100 mg total) by mouth 2 (two) times daily for 10 days.  Dispense: 20 tablet; Refill: 0 -     Fluconazole; Take 1 tablet (150 mg total) by mouth once for 1 dose.  Dispense: 1 tablet; Refill: 0  Sore throat  Fever, unspecified fever cause   Continue supportive care at home for sore throat and fever Tylenol as needed   Meds ordered this encounter  Medications   doxycycline (VIBRA-TABS) 100 MG tablet    Sig: Take 1 tablet (100 mg total) by mouth 2 (two) times daily for 10 days.    Dispense:  20 tablet    Refill:  0   fluconazole (DIFLUCAN) 150 MG tablet    Sig: Take 1 tablet (150 mg total) by mouth once for 1 dose.    Dispense:  1 tablet    Refill:  0    Return if symptoms worsen or fail to improve.  Jimmy Stipes L  Ashley Royalty, FNP

## 2024-01-09 NOTE — Patient Instructions (Signed)
 I have sent in a prescription for doxycycline for you to take twice a day for 10 days.  Take this medication with food as it can upset your stomach if you do not.  May take tylenol as needed for fever and aches  May take benadryl at night as needed  I have also sent in Diflucan for you to take in case of yeast after antibiotic treatment.  If you are having symptoms when you complete antibiotics, may take 1 tablet.  If you are still having symptoms 3 days later, may take the second tablet.   Follow-up with me for new or worsening symptoms.

## 2024-01-10 ENCOUNTER — Other Ambulatory Visit: Payer: Self-pay | Admitting: Internal Medicine

## 2024-01-10 DIAGNOSIS — F411 Generalized anxiety disorder: Secondary | ICD-10-CM

## 2024-01-11 ENCOUNTER — Ambulatory Visit: Payer: Self-pay

## 2024-01-11 MED ORDER — CEFDINIR 300 MG PO CAPS: 300.0000 mg | ORAL_CAPSULE | Freq: Two times a day (BID) | ORAL | 0 refills | Status: AC

## 2024-01-11 NOTE — Telephone Encounter (Signed)
 Spoke with patient, Per Judeth Cornfield she can D/C Doxy, will send in [facility] for patient twice daily x 7 days as patient request. Patient will reach out on Monday if she is not feeling better

## 2024-01-11 NOTE — Telephone Encounter (Signed)
 Chief Complaint: vomiting and diarrhea on antibiotics Symptoms: diarrhea, insomnia, urinary urgency and incontinence, decreased appetite, cough Frequency: since yesterday Pertinent Negatives: Patient denies dizziness, too weak to stand, blood in stool, blood in vomit, fever Disposition: [] ED /[] Urgent Care (no appt availability in office) / [] Appointment(In office/virtual)/ []  [facility]/ [] Home Care/ [] Refused Recommended Disposition /[] [facility]/ [x]  Follow-up with PCP Additional Notes: Patient seen by Moshe Cipro on 01/09/24 for bacterial URI and placed on doxycycline bid. Patient states this morning around 0500 she woke up and felt shaky, she went downstairs and drank a Glucerna and within 10 minutes she had diarrhea. Patient states she has not checked her blood sugar. She states she takes cholestyramine every day and she was not able to take it this morning so she is not sure if that affected her diarrhea. Patient states she has also missed her metformin last night and today. She states the doxycycline has messed with her daily medication regimen. Advised patient to check her blood sugar and she insists it is okay after drinking the Glucerna. Patient states she is stopping the doxycyline and would like to get on something else with less side effects. She states she was on Omnicef back in February and she states she tolerated that medication better. Called [facility] and notified Shanda Bumps, sending CRM for patient high priority.   Summary: diarrehea   Copied From CRM [id number]. Reason for Triage: Patient is because she was prescribed doxycycline (VIBRA-TABS) 100 MG tablet. She having diarrhea, no appetite and a really bad cough.         Reason for Disposition  Patient wants to stop the antibiotic  Answer Assessment - Initial Assessment Questions 1. ANTIBIOTIC: "What antibiotic are you taking?" "How many times per day?"     Doxycycline, twice daily.  2. ANTIBIOTIC  ONSET: "When was the antibiotic started?"     01/09/24.  3. DIARRHEA SEVERITY: "How bad is the diarrhea?" "How many more stools have you had in the past 24 hours than normal?"    - NO DIARRHEA (SCALE 0)   - MILD (SCALE 1-3): Few loose or mushy BMs; increase of 1-3 stools over normal daily number of stools; mild increase in ostomy output.   -  MODERATE (SCALE 4-7): Increase of 4-6 stools daily over normal; moderate increase in ostomy output. * SEVERE (SCALE 8-10; OR 'WORST POSSIBLE'): Increase of 7 or more stools daily over normal; moderate increase in ostomy output; incontinence.     2 episodes in the last 24 hours.   4. ONSET: "When did the diarrhea begin?"      Yesterday.  5. BM CONSISTENCY: "How loose or watery is the diarrhea?"      Soft, loose.  6. VOMITING: "Are you also vomiting?" If Yes, ask: "How many times in the past 24 hours?"      Yes, yesterday she states she had vomiting. 2 episodes.  7. ABDOMEN PAIN: "Are you having any abdomen pain?" If Yes, ask: "What does it feel like?" (e.g., crampy, dull, intermittent, constant)      Denies.  8. ABDOMEN PAIN SEVERITY: If present, ask: "How bad is the pain?"  (e.g., Scale 1-10; mild, moderate, or severe)   - MILD (1-3): doesn't interfere with normal activities, abdomen soft and not tender to touch    - MODERATE (4-7): interferes with normal activities or awakens from sleep, abdomen tender to touch    - SEVERE (8-10): excruciating pain, doubled over, unable to do any normal activities  Denies.  9. ORAL INTAKE: If vomiting, "Have you been able to drink liquids?" "How much liquids have you had in the past 24 hours?"     Patient states she was able to drink unsweet tea, Glucerna, a small glass Coke, and water in the last 24 hours.  10. HYDRATION: "Any signs of dehydration?" (e.g., dry mouth [not just dry lips], too weak to stand, dizziness, new weight loss) "When did you last urinate?"       Patient making urine, last urinated this  morning. Patient states she feels better now that she is not vomiting.  11. EXPOSURE: "Have you traveled to a foreign country recently?" "Have you been exposed to anyone with diarrhea?" "Could you have eaten any food that was spoiled?"       Denies. She states she has an Therapist, sports who is also sick but she states she is not aware of any diarrhea.  12. OTHER SYMPTOMS: "Do you have any other symptoms?" (e.g., fever, blood in stool)       Insomnia since starting doxycycline, urinary urgency and incontinence from coughing.  13. PREGNANCY: "Is there any chance you are pregnant?" "When was your last menstrual period?"       N/A.  Protocols used: Diarrhea on Antibiotics-A-AH

## 2024-01-11 NOTE — Addendum Note (Signed)
 Addended by: Sherald Barge on: 01/11/2024 11:47 AM   Modules accepted: Orders

## 2024-01-14 ENCOUNTER — Telehealth: Payer: Self-pay | Admitting: Internal Medicine

## 2024-01-14 NOTE — Telephone Encounter (Unsigned)
 Copied from CRM [id number]. Topic: Clinical - Medical Advice >> Jan 14, 2024  9:14 AM Fuller Mandril wrote: Reason for CRM: Patient called. Wanted to know how she can get her medication ALPRAZolam (XANAX) 0.5 MG tablet refilled. She only has two pills left. Refill denied for patient needs appt. Patient has been sick for 3 weeks. Was seen by Judeth Cornfield on 4/2, prescribed medication she had a reaction to. That was changed to a different medication and she has not yet completed the medication that was sent on 4/4. Dr Yetta Barre 1st available 4/30. Scheduled patient for that appt, however she only has two pills left. Would like a call back to know what she can do. Thank You

## 2024-01-15 ENCOUNTER — Other Ambulatory Visit: Payer: Self-pay | Admitting: Internal Medicine

## 2024-01-15 DIAGNOSIS — F411 Generalized anxiety disorder: Secondary | ICD-10-CM

## 2024-01-18 NOTE — Telephone Encounter (Signed)
 Medication refill was sent in for the patient.

## 2024-02-06 ENCOUNTER — Ambulatory Visit: Admitting: Internal Medicine

## 2024-02-06 ENCOUNTER — Encounter: Payer: Self-pay | Admitting: Internal Medicine

## 2024-02-06 VITALS — BP 120/88 | HR 76 | Temp 98.4°F | Resp 16 | Ht 62.5 in | Wt 139.8 lb

## 2024-02-06 DIAGNOSIS — E118 Type 2 diabetes mellitus with unspecified complications: Secondary | ICD-10-CM | POA: Diagnosis not present

## 2024-02-06 DIAGNOSIS — E1121 Type 2 diabetes mellitus with diabetic nephropathy: Secondary | ICD-10-CM | POA: Diagnosis not present

## 2024-02-06 DIAGNOSIS — Z0001 Encounter for general adult medical examination with abnormal findings: Secondary | ICD-10-CM

## 2024-02-06 DIAGNOSIS — R058 Other specified cough: Secondary | ICD-10-CM | POA: Diagnosis not present

## 2024-02-06 DIAGNOSIS — I10 Essential (primary) hypertension: Secondary | ICD-10-CM

## 2024-02-06 DIAGNOSIS — T464X5A Adverse effect of angiotensin-converting-enzyme inhibitors, initial encounter: Secondary | ICD-10-CM

## 2024-02-06 DIAGNOSIS — Z0000 Encounter for general adult medical examination without abnormal findings: Secondary | ICD-10-CM | POA: Diagnosis not present

## 2024-02-06 DIAGNOSIS — E785 Hyperlipidemia, unspecified: Secondary | ICD-10-CM

## 2024-02-06 LAB — HEPATIC FUNCTION PANEL
ALT: 8 U/L (ref 0–35)
AST: 13 U/L (ref 0–37)
Albumin: 4.4 g/dL (ref 3.5–5.2)
Alkaline Phosphatase: 54 U/L (ref 39–117)
Bilirubin, Direct: 0.2 mg/dL (ref 0.0–0.3)
Total Bilirubin: 1.1 mg/dL (ref 0.2–1.2)
Total Protein: 6.7 g/dL (ref 6.0–8.3)

## 2024-02-06 LAB — URINALYSIS, ROUTINE W REFLEX MICROSCOPIC
Bilirubin Urine: NEGATIVE
Hgb urine dipstick: NEGATIVE
Ketones, ur: NEGATIVE
Leukocytes,Ua: NEGATIVE
Nitrite: NEGATIVE
Specific Gravity, Urine: <=1.005 — AB (ref 1.000–1.030)
Total Protein, Urine: NEGATIVE
Urine Glucose: NEGATIVE
Urobilinogen, UA: 0.2 (ref 0.0–1.0)
pH: 6 (ref 5.0–8.0)

## 2024-02-06 LAB — CBC WITH DIFFERENTIAL/PLATELET
Basophils Absolute: 0 10*3/uL (ref 0.0–0.1)
Basophils Relative: 0.3 % (ref 0.0–3.0)
Eosinophils Absolute: 0.1 10*3/uL (ref 0.0–0.7)
Eosinophils Relative: 2.4 % (ref 0.0–5.0)
HCT: 40 % (ref 36.0–46.0)
Hemoglobin: 13.3 g/dL (ref 12.0–15.0)
Lymphocytes Relative: 30 % (ref 12.0–46.0)
Lymphs Abs: 1.9 10*3/uL (ref 0.7–4.0)
MCHC: 33.2 g/dL (ref 30.0–36.0)
MCV: 102.5 fl — ABNORMAL HIGH (ref 78.0–100.0)
Monocytes Absolute: 0.4 10*3/uL (ref 0.1–1.0)
Monocytes Relative: 6.3 % (ref 3.0–12.0)
Neutro Abs: 3.8 10*3/uL (ref 1.4–7.7)
Neutrophils Relative %: 61 % (ref 43.0–77.0)
Platelets: 200 10*3/uL (ref 150.0–400.0)
RBC: 3.91 Mil/uL (ref 3.87–5.11)
RDW: 15.3 % (ref 11.5–15.5)
WBC: 6.2 10*3/uL (ref 4.0–10.5)

## 2024-02-06 LAB — BASIC METABOLIC PANEL WITH GFR
BUN: 11 mg/dL (ref 6–23)
CO2: 29 meq/L (ref 19–32)
Calcium: 9.2 mg/dL (ref 8.4–10.5)
Chloride: 103 meq/L (ref 96–112)
Creatinine, Ser: 0.78 mg/dL (ref 0.40–1.20)
GFR: 71.86 mL/min (ref >60.00)
Glucose, Bld: 111 mg/dL — ABNORMAL HIGH (ref 70–99)
Potassium: 4.2 meq/L (ref 3.5–5.1)
Sodium: 140 meq/L (ref 135–145)

## 2024-02-06 LAB — LIPID PANEL
Cholesterol: 130 mg/dL (ref 0–200)
HDL: 48.3 mg/dL (ref >39.00)
LDL Cholesterol: 56 mg/dL (ref 0–99)
NonHDL: 81.93
Total CHOL/HDL Ratio: 3
Triglycerides: 130 mg/dL (ref 0.0–149.0)
VLDL: 26 mg/dL (ref 0.0–40.0)

## 2024-02-06 LAB — MICROALBUMIN / CREATININE URINE RATIO
Creatinine,U: 28.8 mg/dL
Microalb Creat Ratio: 28.8 mg/g (ref 0.0–30.0)
Microalb, Ur: 0.8 mg/dL (ref 0.0–1.9)

## 2024-02-06 LAB — TSH: TSH: 2.66 u[IU]/mL (ref 0.35–5.50)

## 2024-02-06 LAB — HEMOGLOBIN A1C: Hgb A1c MFr Bld: 6.6 % — ABNORMAL HIGH (ref 4.6–6.5)

## 2024-02-06 NOTE — Patient Instructions (Signed)

## 2024-02-06 NOTE — Progress Notes (Addendum)
 Subjective:  Patient ID: Sydney Avila, female    DOB: [birth year]  Age: 78 y.o. MRN: [id number]  CC: Annual Exam, Cough, Hypertension, Hyperlipidemia, and Diabetes   HPI Sydney Avila presents for a CPX and f/up ----  Discussed the use of AI scribe software for clinical note transcription with the patient, who gave verbal consent to proceed.  History of Present Illness   Sydney Avila "Sydney Avila" is an 78 year old female who presents with allergy symptoms and weight loss.  She has been experiencing allergy symptoms, which she attributes to yellow pollen. Symptoms include cough and hoarseness, particularly when she is outside or after returning indoors without wearing a mask. Her gardening activities, including planting, exacerbate these symptoms.  She has experienced significant weight loss, approximately 50 pounds since her last visit, with no associated symptoms such as nausea or vomiting. Her weight has been stable recently. She monitors her blood pressure at home and reports good readings.  She takes lisinopril , 20 mg, for blood pressure management but is concerned it might be contributing to a chronic cough. She has not had an EKG recently and is due for an A1c test, which she believes was last done in January.  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 No chest pain,  or palpitations. No nausea or vomiting.       Outpatient Medications Prior to Visit  Medication Sig Dispense Refill   ALPRAZolam  (XANAX ) 0.5 MG tablet TAKE ONE TABLET BY MOUTH TWICE A DAY AS NEEDED FOR ANXIETY 45 tablet 0   blood glucose meter kit and supplies Dispense based on patient and insurance preference. Use up to 2 times daily as directed. (FOR ICD-10 E10.9, E11.9). 1 each 0   cholestyramine  (QUESTRAN ) 4 g packet TAKE ONE PACKET BY MOUTH THREE TIMES A DAY WITH MEALS 60 packet 0   Dulaglutide   (TRULICITY ) 1.5 MG/0.5ML SOAJ Inject 1.5 mg into the skin once a week. 6 mL 1   fluocinonide  cream (LIDEX ) 0.05 % Apply 1 Application topically 2 (two) times daily. 120 g 1   Lancets (ONETOUCH DELICA PLUS LANCET33G) MISC Use to check blood sugars twice daily. DX: E11.9 200 each 3   loratadine (CLARITIN) 10 MG tablet Take 10 mg by mouth daily.     metFORMIN  (GLUCOPHAGE -XR) 500 MG 24 hr tablet TAKE 2 TABLETS BY MOUTH TWICE A DAY 240 tablet 0   NEOMYCIN -POLYMYXIN-HYDROCORTISONE (CORTISPORIN) 1 % SOLN OTIC solution PLACE 3 DROPS INTO THE LEFT EAR EVERY 8 (EIGHT) HOURS. 10 mL 0   simvastatin  (ZOCOR ) 10 MG tablet TAKE 1 TABLET BY MOUTH EVERY NIGHT AT BEDTIME 90 tablet 0   lisinopril  (ZESTRIL ) 20 MG tablet TAKE 1 TABLET BY MOUTH DAILY 90 tablet 1   lidocaine  (XYLOCAINE ) 2 % solution Use as directed 15 mLs in the mouth or throat 3 (three) times daily before meals. 100 mL 0   loratadine (CLARITIN) 10 MG tablet Take 10 mg by mouth daily.     No facility-administered medications prior to visit.    ROS Review of Systems  Constitutional:  Negative for appetite change, diaphoresis, fatigue and fever.  HENT:  Positive for postnasal drip and rhinorrhea. Negative for nosebleeds, sinus pain, sneezing and trouble swallowing.   Respiratory:  Positive for cough. Negative for chest tightness, shortness of breath and wheezing.   Cardiovascular:  Negative for chest pain, palpitations and leg swelling.  Gastrointestinal:  Negative.  Negative for abdominal pain, constipation, diarrhea, nausea and vomiting.  Genitourinary: Negative.  Negative for difficulty urinating and dysuria.  Musculoskeletal: Negative.  Negative for arthralgias and myalgias.  Skin: Negative.   Neurological: Negative.  Negative for dizziness and weakness.  Hematological:  Negative for adenopathy. Does not bruise/bleed easily.  Psychiatric/Behavioral:  Negative for confusion and decreased concentration. The patient is nervous/anxious.      Objective:  BP 120/88 (BP Location: Left Arm, Patient Position: Sitting)   Pulse 76   Temp 98.4 F (36.9 C) (Temporal)   Resp 16   Ht 5' 2.5" (1.588 m)   Wt 139 lb 12.8 oz (63.4 kg)   SpO2 97%   BMI 25.16 kg/m   BP Readings from Last 3 Encounters:  02/06/24 120/88  01/09/24 (!) 160/80  11/10/23 (!) 180/77    Wt Readings from Last 3 Encounters:  02/06/24 139 lb 12.8 oz (63.4 kg)  01/09/24 140 lb (63.5 kg)  11/02/23 146 lb (66.2 kg)    Physical Exam Vitals reviewed.  Constitutional:      Appearance: Normal appearance.  HENT:     Nose: Nose normal.     Mouth/Throat:     Pharynx: Oropharynx is clear.  Eyes:     General: No scleral icterus.    Conjunctiva/sclera: Conjunctivae normal.  Cardiovascular:     Rate and Rhythm: Normal rate and regular rhythm.     Heart sounds: No murmur heard.    No friction rub. No gallop.     Comments: EKG-- NSR, 76 bpm Low voltage Inferior infarct pattern is old No LVH Unchanged  Pulmonary:     Effort: Pulmonary effort is normal.     Breath sounds: No stridor. No wheezing, rhonchi or rales.  Abdominal:     General: Abdomen is flat. Bowel sounds are normal. There is no distension.     Tenderness: There is no abdominal tenderness. There is no guarding.     Hernia: No hernia is present.  Musculoskeletal:     Cervical back: Neck supple.     Right lower leg: No edema.     Left lower leg: No edema.  Lymphadenopathy:     Cervical: No cervical adenopathy.  Skin:    General: Skin is warm and dry.  Neurological:     General: No focal deficit present.     Mental Status: She is alert and oriented to person, place, and time. Mental status is at baseline.  Psychiatric:        Mood and Affect: Mood normal.        Behavior: Behavior normal.     Lab Results  Component Value Date   WBC 6.2 02/06/2024   HGB 13.3 02/06/2024   HCT 40.0 02/06/2024   PLT 200.0 02/06/2024   GLUCOSE 111 (H) 02/06/2024   CHOL 130 02/06/2024   TRIG  130.0 02/06/2024   HDL 48.30 02/06/2024   LDLDIRECT 61.0 09/29/2021   LDLCALC 56 02/06/2024   ALT 8 02/06/2024   AST 13 02/06/2024   NA 140 02/06/2024   K 4.2 02/06/2024   CL 103 02/06/2024   CREATININE 0.78 02/06/2024   BUN 11 02/06/2024   CO2 29 02/06/2024   TSH 2.66 02/06/2024   HGBA1C 6.6 (H) 02/06/2024   MICROALBUR 0.8 02/06/2024    No results found.  Assessment & Plan:   Essential hypertension, benign- BP is well controlled. EKG is negative for LVH. -     TSH; Future -  Urinalysis, Routine w reflex microscopic; Future -     Hepatic function panel; Future -     CBC with Differential/Platelet; Future -     Basic metabolic panel with GFR; Future -     EKG 12-Lead  Type 2 diabetes mellitus with complication, without long-term current use of insulin  (HCC)- Her blood sugar is well controlled. -     Microalbumin / creatinine urine ratio; Future -     Urinalysis, Routine w reflex microscopic; Future -     Hemoglobin A1c; Future -     HM Diabetes Foot Exam  Type 2 diabetes mellitus with diabetic nephropathy, without long-term current use of insulin  (HCC) -     Microalbumin / creatinine urine ratio; Future -     Urinalysis, Routine w reflex microscopic; Future -     Hemoglobin A1c; Future -     Basic metabolic panel with GFR; Future  Hyperlipidemia with target LDL less than 100- LDL goal achieved. Doing well on the statin  -     Lipid panel; Future -     TSH; Future  ACE-inhibitor cough- Will discontinue the ACEI.  Encounter for general adult medical examination with abnormal findings - Exam completed, labs reviewed, vaccines reviewed, no cancer screenings indicated, pt ed material was given.      Follow-up: Return in about 6 months (around 08/07/2024).  Sandra Crouch, MD

## 2024-02-07 MED ORDER — SIMVASTATIN 10 MG PO TABS: 10.0000 mg | ORAL_TABLET | Freq: Every day | ORAL | 0 refills | Status: DC

## 2024-02-16 ENCOUNTER — Other Ambulatory Visit: Payer: Self-pay | Admitting: Internal Medicine

## 2024-02-20 ENCOUNTER — Ambulatory Visit: Admitting: Family Medicine

## 2024-02-20 ENCOUNTER — Encounter: Payer: Self-pay | Admitting: Family Medicine

## 2024-02-20 VITALS — BP 140/66 | HR 74 | Temp 98.0°F | Ht 62.5 in | Wt 139.0 lb

## 2024-02-20 DIAGNOSIS — E118 Type 2 diabetes mellitus with unspecified complications: Secondary | ICD-10-CM | POA: Diagnosis not present

## 2024-02-20 DIAGNOSIS — Z7984 Long term (current) use of oral hypoglycemic drugs: Secondary | ICD-10-CM

## 2024-02-20 DIAGNOSIS — N1831 Chronic kidney disease, stage 3a: Secondary | ICD-10-CM | POA: Diagnosis not present

## 2024-02-20 DIAGNOSIS — E785 Hyperlipidemia, unspecified: Secondary | ICD-10-CM | POA: Diagnosis not present

## 2024-02-20 DIAGNOSIS — F411 Generalized anxiety disorder: Secondary | ICD-10-CM

## 2024-02-20 DIAGNOSIS — K9089 Other intestinal malabsorption: Secondary | ICD-10-CM | POA: Diagnosis not present

## 2024-02-20 DIAGNOSIS — F321 Major depressive disorder, single episode, moderate: Secondary | ICD-10-CM | POA: Diagnosis not present

## 2024-02-20 DIAGNOSIS — Z79899 Other long term (current) drug therapy: Secondary | ICD-10-CM | POA: Insufficient documentation

## 2024-02-20 DIAGNOSIS — R053 Chronic cough: Secondary | ICD-10-CM | POA: Insufficient documentation

## 2024-02-20 MED ORDER — NEOMYCIN-POLYMYXIN-HC 1 % OT SOLN: 3.0000 [drp] | OTIC | 0 refills | Status: AC

## 2024-02-20 MED ORDER — METFORMIN HCL ER 500 MG PO TB24: 1000.0000 mg | ORAL_TABLET | Freq: Two times a day (BID) | ORAL | 3 refills | Status: DC

## 2024-02-20 MED ORDER — CHOLESTYRAMINE 4 G PO PACK: PACK | ORAL | 0 refills | Status: DC

## 2024-02-20 MED ORDER — LORATADINE 10 MG PO TABS: 10.0000 mg | ORAL_TABLET | Freq: Every day | ORAL | 11 refills | Status: AC

## 2024-02-20 MED ORDER — ALPRAZOLAM 0.5 MG PO TABS: ORAL_TABLET | ORAL | 0 refills | Status: DC

## 2024-02-20 MED ORDER — SIMVASTATIN 10 MG PO TABS: 10.0000 mg | ORAL_TABLET | Freq: Every day | ORAL | 0 refills | Status: DC

## 2024-02-20 NOTE — Progress Notes (Signed)
 Acute Office Visit  Subjective:     Patient ID: Sydney Avila, female    DOB: September 28, 1946, 78 y.o.   MRN: [id number]  Chief Complaint  Patient presents with   Medical Management of Chronic Issues    Discuss medications, and recent labs/EKG that was done on 04/30    HPI Patient is in today for evaluation of elevated blood pressures and medication management. Saw DR Rochelle Chu on 02/06/24 and stopped lisinopril .  She denies cough with lisinopril . States that cough resolved after treatment of URI about 6 weeks ago. Reports compliance with medication management.   Medical hx as outlined below.  ROS Per HPI      Objective:    BP (!) 140/66 (BP Location: Left Arm, Patient Position: Sitting)   Pulse 74   Temp 98 F (36.7 C) (Temporal)   Ht 5' 2.5" (1.588 m)   Wt 139 lb (63 kg)   SpO2 95%   BMI 25.02 kg/m    Physical Exam Vitals and nursing note reviewed.  Constitutional:      General: She is not in acute distress.    Appearance: Normal appearance. She is normal weight.  HENT:     Head: Normocephalic and atraumatic.     Right Ear: External ear normal.     Left Ear: External ear normal.     Nose: Nose normal. No congestion.     Mouth/Throat:     Mouth: Mucous membranes are moist.     Pharynx: Oropharynx is clear. No oropharyngeal exudate or posterior oropharyngeal erythema.  Eyes:     Extraocular Movements: Extraocular movements intact.     Pupils: Pupils are equal, round, and reactive to light.  Cardiovascular:     Rate and Rhythm: Normal rate and regular rhythm.     Pulses: Normal pulses.     Heart sounds: Normal heart sounds.  Pulmonary:     Effort: Pulmonary effort is normal. No respiratory distress.     Breath sounds: Normal breath sounds. No wheezing, rhonchi or rales.  Musculoskeletal:        General: Normal range of motion.     Cervical back: Normal range of motion and neck supple.     Right lower leg: No edema.     Left lower leg: No edema.   Lymphadenopathy:     Cervical: No cervical adenopathy.  Skin:    General: Skin is warm and dry.  Neurological:     General: No focal deficit present.     Mental Status: She is alert and oriented to person, place, and time.  Psychiatric:        Mood and Affect: Mood normal.        Thought Content: Thought content normal.     No results found for any visits on 02/20/24.      Assessment & Plan:   Type 2 diabetes mellitus with complication, without long-term current use of insulin  (HCC) Assessment & Plan: Stable, refilled metformin   Orders: -     metFORMIN  HCl ER; Take 2 tablets (1,000 mg total) by mouth 2 (two) times daily.  Dispense: 240 tablet; Refill: 3 -     CBC with Differential/Platelet; Future -     Comprehensive metabolic panel with GFR; Future -     Microalbumin / creatinine urine ratio; Future  Bile salt-induced diarrhea -     Cholestyramine ; TAKE ONE PACKET BY MOUTH THREE TIMES A DAY WITH MEALS  Dispense: 60 packet; Refill: 0 -  Comprehensive metabolic panel with GFR; Future -     Lipid panel; Future  GAD (generalized anxiety disorder) -     ALPRAZolam ; TAKE ONE TABLET BY MOUTH TWICE A DAY AS NEEDED FOR ANXIETY  Dispense: 45 tablet; Refill: 0  Hyperlipidemia with target LDL less than 100 Assessment & Plan: Stable, refilled simvastatin   Orders: -     Simvastatin ; Take 1 tablet (10 mg total) by mouth at bedtime.  Dispense: 90 tablet; Refill: 0  Medication management Assessment & Plan: Continue current medication regimen, no dose adjustments needed according to labs Labs reviewed with patient today  Orders: -     CBC with Differential/Platelet; Future -     Comprehensive metabolic panel with GFR; Future -     Hemoglobin A1c; Future -     Lipid panel; Future -     TSH; Future -     Microalbumin / creatinine urine ratio; Future  Current moderate episode of major depressive disorder without prior episode (HCC) Assessment & Plan: Stable   Stage 3a  chronic kidney disease (HCC) Assessment & Plan: Stable, restart lisinopril  10mg   Orders: -     CBC with Differential/Platelet; Future -     Comprehensive metabolic panel with GFR; Future -     Hemoglobin A1c; Future -     Microalbumin / creatinine urine ratio; Future  Other orders -     Loratadine ; Take 1 tablet (10 mg total) by mouth daily.  Dispense: 30 tablet; Refill: 11 -     Neomycin -Polymyxin-HC; Place 3 drops into both ears every 4 (four) hours.  Dispense: 10 mL; Refill: 0     Meds ordered this encounter  Medications   DISCONTD: metFORMIN  (GLUCOPHAGE -XR) 500 MG 24 hr tablet    Sig: Take 2 tablets (1,000 mg total) by mouth 2 (two) times daily.    Dispense:  240 tablet    Refill:  3   cholestyramine  (QUESTRAN ) 4 g packet    Sig: TAKE ONE PACKET BY MOUTH THREE TIMES A DAY WITH MEALS    Dispense:  60 packet    Refill:  0   ALPRAZolam  (XANAX ) 0.5 MG tablet    Sig: TAKE ONE TABLET BY MOUTH TWICE A DAY AS NEEDED FOR ANXIETY    Dispense:  45 tablet    Refill:  0   loratadine  (CLARITIN ) 10 MG tablet    Sig: Take 1 tablet (10 mg total) by mouth daily.    Dispense:  30 tablet    Refill:  11   simvastatin  (ZOCOR ) 10 MG tablet    Sig: Take 1 tablet (10 mg total) by mouth at bedtime.    Dispense:  90 tablet    Refill:  0   NEOMYCIN -POLYMYXIN-HYDROCORTISONE (CORTISPORIN) 1 % SOLN OTIC solution    Sig: Place 3 drops into both ears every 4 (four) hours.    Dispense:  10 mL    Refill:  0   metFORMIN  (GLUCOPHAGE -XR) 500 MG 24 hr tablet    Sig: Take 2 tablets (1,000 mg total) by mouth 2 (two) times daily.    Dispense:  240 tablet    Refill:  3    Return in about 6 months (around 08/22/2024) for meds, labs prior to appt.  Wellington Half, FNP

## 2024-02-20 NOTE — Patient Instructions (Addendum)
 Medications have been refilled today.  I have also refilled lisinopril . If you notice a cough at all that increases with this medication, then stop it and let me know.   Let's recheck labs and medications in about 6 months.   Lab appt prior to visit.

## 2024-02-27 ENCOUNTER — Encounter: Payer: Self-pay | Admitting: Family Medicine

## 2024-02-27 NOTE — Assessment & Plan Note (Signed)
 Stable, refilled simvastatin 

## 2024-02-27 NOTE — Assessment & Plan Note (Signed)
 Stable

## 2024-02-27 NOTE — Assessment & Plan Note (Signed)
 Stable, restart lisinopril  10mg 

## 2024-02-27 NOTE — Assessment & Plan Note (Signed)
 Stable, refilled metformin 

## 2024-02-27 NOTE — Assessment & Plan Note (Signed)
 Continue current medication regimen, no dose adjustments needed according to labs Labs reviewed with patient today

## 2024-03-25 DIAGNOSIS — L821 Other seborrheic keratosis: Secondary | ICD-10-CM | POA: Diagnosis not present

## 2024-03-25 DIAGNOSIS — L820 Inflamed seborrheic keratosis: Secondary | ICD-10-CM | POA: Diagnosis not present

## 2024-03-25 DIAGNOSIS — L570 Actinic keratosis: Secondary | ICD-10-CM | POA: Diagnosis not present

## 2024-03-25 DIAGNOSIS — L309 Dermatitis, unspecified: Secondary | ICD-10-CM | POA: Diagnosis not present

## 2024-04-06 ENCOUNTER — Other Ambulatory Visit: Payer: Self-pay | Admitting: Family Medicine

## 2024-04-06 DIAGNOSIS — F411 Generalized anxiety disorder: Secondary | ICD-10-CM

## 2024-04-14 DIAGNOSIS — E119 Type 2 diabetes mellitus without complications: Secondary | ICD-10-CM | POA: Diagnosis not present

## 2024-04-14 DIAGNOSIS — H43813 Vitreous degeneration, bilateral: Secondary | ICD-10-CM | POA: Diagnosis not present

## 2024-04-14 DIAGNOSIS — Z961 Presence of intraocular lens: Secondary | ICD-10-CM | POA: Diagnosis not present

## 2024-04-14 LAB — HM DIABETES EYE EXAM

## 2024-04-19 ENCOUNTER — Other Ambulatory Visit: Payer: Self-pay | Admitting: Family Medicine

## 2024-04-19 DIAGNOSIS — K9089 Other intestinal malabsorption: Secondary | ICD-10-CM

## 2024-04-28 ENCOUNTER — Ambulatory Visit (INDEPENDENT_AMBULATORY_CARE_PROVIDER_SITE_OTHER): Admitting: Family Medicine

## 2024-04-28 ENCOUNTER — Encounter: Payer: Self-pay | Admitting: Family Medicine

## 2024-04-28 VITALS — BP 138/72 | HR 74 | Temp 97.9°F | Ht 62.5 in | Wt 139.6 lb

## 2024-04-28 DIAGNOSIS — T63461A Toxic effect of venom of wasps, accidental (unintentional), initial encounter: Secondary | ICD-10-CM | POA: Diagnosis not present

## 2024-04-28 DIAGNOSIS — I10 Essential (primary) hypertension: Secondary | ICD-10-CM

## 2024-04-28 DIAGNOSIS — M79644 Pain in right finger(s): Secondary | ICD-10-CM

## 2024-04-28 NOTE — Patient Instructions (Addendum)
 Claritin  twice a day for the next 2 weeks, then once daily afterward.  This should help decrease your histamine response from the wasp stings.  Follow-up with me for new or worsening symptoms.

## 2024-04-28 NOTE — Progress Notes (Signed)
 "  Acute Office Visit  Subjective:     Patient ID: Sydney Avila, female    DOB: 06/29/1946, 78 y.o.   MRN: [id number]  Chief Complaint  Patient presents with   Acute Visit    Wasp stings, 2 1/2 weeks ago. Bilateral hands and arms    HPI  Discussed the use of AI scribe software for clinical note transcription with the patient, who gave verbal consent to proceed.  History of Present Illness Sydney Avila is an 78 year old female who presents with multiple wasp stings and subsequent allergic reaction.  Allergic reaction to wasp stings - Sustained multiple wasp stings two weeks ago while gardening, affecting both hands and face - Developed significant swelling, discoloration, and pain at sting sites - Right thumb remains swollen and painful, limiting ability to perform daily tasks - Initial use of Benadryl for symptom relief, discontinued due to drowsiness - Uses Aleve for pain relief, especially at night  Medication management concerns - Current medications include metformin , lisinopril , and a statin - Expresses concern regarding timing of medication administration and potential interactions with daily routine, including morning coffee     ROS Per HPI      Objective:    BP 138/72   Pulse 74   Temp 97.9 F (36.6 C) (Temporal)   Ht 5' 2.5 (1.588 m)   Wt 139 lb 9.6 oz (63.3 kg)   SpO2 96%   BMI 25.13 kg/m    Physical Exam Vitals and nursing note reviewed.  Constitutional:      General: She is not in acute distress.    Appearance: Normal appearance. She is normal weight.  HENT:     Head: Normocephalic and atraumatic.     Right Ear: External ear normal.     Left Ear: External ear normal.     Nose: Nose normal.     Mouth/Throat:     Mouth: Mucous membranes are moist.     Pharynx: Oropharynx is clear.  Eyes:     Extraocular Movements: Extraocular movements intact.     Pupils: Pupils are equal, round, and reactive to light.  Cardiovascular:     Rate and  Rhythm: Normal rate and regular rhythm.     Pulses: Normal pulses.     Heart sounds: Normal heart sounds.  Pulmonary:     Effort: Pulmonary effort is normal. No respiratory distress.     Breath sounds: Normal breath sounds. No wheezing, rhonchi or rales.  Musculoskeletal:     Cervical back: Normal range of motion.     Right lower leg: No edema.     Left lower leg: No edema.     Comments: Right thumb with PIP joint swelling, mild tenderness, mildly LROM  Lymphadenopathy:     Cervical: No cervical adenopathy.  Neurological:     General: No focal deficit present.     Mental Status: She is alert and oriented to person, place, and time.  Psychiatric:        Mood and Affect: Mood normal.        Thought Content: Thought content normal.     No results found for any visits on 04/28/24.      Assessment & Plan:   Assessment and Plan Assessment & Plan Wasp stings with allergic reaction Multiple wasp stings caused swelling, discoloration, and itching. Discontinued Benadryl due to drowsiness. Claritin  recommended to manage symptoms without sedation. Improvement expected over 90 days, significant within two weeks. - Claritin  (loratadine ) twice  daily for two weeks to manage symptoms. - Reassess in two weeks; consider reducing to once daily if improved. - Consider steroid injection if no improvement, especially for thumb swelling. - Avoid Claritin  D due to unnecessary decongestant effects.  Chronic medication management Confusion about medication timing, especially with Cholestyramine  interactions. Claritin  timing must consider these interactions. - Take Claritin  at lunchtime, three hours after morning Cholestyramine . - Continue current medication regimen, ensuring proper timing to avoid interactions.     No orders of the defined types were placed in this encounter.    No orders of the defined types were placed in this encounter.   Return if symptoms worsen or fail to  improve.  Corean LITTIE Ku, FNP  "

## 2024-05-02 ENCOUNTER — Encounter: Payer: Self-pay | Admitting: Family Medicine

## 2024-05-02 DIAGNOSIS — M79644 Pain in right finger(s): Secondary | ICD-10-CM | POA: Insufficient documentation

## 2024-05-02 DIAGNOSIS — G8929 Other chronic pain: Secondary | ICD-10-CM | POA: Insufficient documentation

## 2024-05-02 DIAGNOSIS — T63461A Toxic effect of venom of wasps, accidental (unintentional), initial encounter: Secondary | ICD-10-CM | POA: Insufficient documentation

## 2024-05-20 ENCOUNTER — Other Ambulatory Visit: Payer: Self-pay | Admitting: Internal Medicine

## 2024-05-20 ENCOUNTER — Other Ambulatory Visit: Payer: Self-pay | Admitting: Family Medicine

## 2024-05-20 DIAGNOSIS — E785 Hyperlipidemia, unspecified: Secondary | ICD-10-CM

## 2024-05-20 DIAGNOSIS — E1121 Type 2 diabetes mellitus with diabetic nephropathy: Secondary | ICD-10-CM

## 2024-05-20 DIAGNOSIS — E118 Type 2 diabetes mellitus with unspecified complications: Secondary | ICD-10-CM

## 2024-05-20 DIAGNOSIS — I10 Essential (primary) hypertension: Secondary | ICD-10-CM

## 2024-05-22 ENCOUNTER — Other Ambulatory Visit: Payer: Self-pay | Admitting: Family Medicine

## 2024-05-22 DIAGNOSIS — F411 Generalized anxiety disorder: Secondary | ICD-10-CM

## 2024-06-12 ENCOUNTER — Other Ambulatory Visit: Payer: Self-pay | Admitting: Internal Medicine

## 2024-06-12 DIAGNOSIS — E118 Type 2 diabetes mellitus with unspecified complications: Secondary | ICD-10-CM

## 2024-06-12 DIAGNOSIS — I10 Essential (primary) hypertension: Secondary | ICD-10-CM

## 2024-06-12 DIAGNOSIS — E1121 Type 2 diabetes mellitus with diabetic nephropathy: Secondary | ICD-10-CM

## 2024-06-20 ENCOUNTER — Other Ambulatory Visit: Payer: Self-pay | Admitting: Family Medicine

## 2024-06-20 DIAGNOSIS — K9089 Other intestinal malabsorption: Secondary | ICD-10-CM

## 2024-06-26 ENCOUNTER — Ambulatory Visit: Admitting: Family Medicine

## 2024-06-29 NOTE — Progress Notes (Deleted)
   Acute Office Visit  Subjective:     Patient ID: Sydney Avila, female    DOB: [birth year], 78 y.o.   MRN: [id number]  No chief complaint on file.   HPI  Discussed the use of AI scribe software for clinical note transcription with the patient, who gave verbal consent to proceed.  History of Present Illness      ROS Per HPI      Objective:    There were no vitals taken for this visit.   Physical Exam Vitals and nursing note reviewed.  Constitutional:      General: She is not in acute distress.    Appearance: Normal appearance. She is normal weight.  HENT:     Head: Normocephalic and atraumatic.     Right Ear: External ear normal.     Left Ear: External ear normal.     Nose: Nose normal.     Mouth/Throat:     Mouth: Mucous membranes are moist.     Pharynx: Oropharynx is clear.  Eyes:     Extraocular Movements: Extraocular movements intact.     Pupils: Pupils are equal, round, and reactive to light.  Cardiovascular:     Rate and Rhythm: Normal rate and regular rhythm.     Pulses: Normal pulses.     Heart sounds: Normal heart sounds.  Pulmonary:     Effort: Pulmonary effort is normal. No respiratory distress.     Breath sounds: Normal breath sounds. No wheezing, rhonchi or rales.  Musculoskeletal:        General: Normal range of motion.     Cervical back: Normal range of motion.     Right lower leg: No edema.     Left lower leg: No edema.  Lymphadenopathy:     Cervical: No cervical adenopathy.  Neurological:     General: No focal deficit present.     Mental Status: She is alert and oriented to person, place, and time.  Psychiatric:        Mood and Affect: Mood normal.        Thought Content: Thought content normal.     No results found for any visits on 06/30/24.      Assessment & Plan:   Assessment and Plan Assessment & Plan      No orders of the defined types were placed in this encounter.    No orders of the defined types were placed  in this encounter.   No follow-ups on file.  Corean LITTIE Ku, FNP

## 2024-06-30 ENCOUNTER — Other Ambulatory Visit: Payer: Self-pay | Admitting: Family Medicine

## 2024-06-30 ENCOUNTER — Ambulatory Visit: Admitting: Family Medicine

## 2024-06-30 DIAGNOSIS — F411 Generalized anxiety disorder: Secondary | ICD-10-CM

## 2024-07-03 ENCOUNTER — Ambulatory Visit (INDEPENDENT_AMBULATORY_CARE_PROVIDER_SITE_OTHER)

## 2024-07-03 ENCOUNTER — Encounter: Payer: Self-pay | Admitting: Family Medicine

## 2024-07-03 ENCOUNTER — Ambulatory Visit (INDEPENDENT_AMBULATORY_CARE_PROVIDER_SITE_OTHER): Admitting: Family Medicine

## 2024-07-03 VITALS — BP 136/78 | HR 74 | Temp 97.8°F | Ht 62.5 in | Wt 137.6 lb

## 2024-07-03 DIAGNOSIS — E1121 Type 2 diabetes mellitus with diabetic nephropathy: Secondary | ICD-10-CM | POA: Diagnosis not present

## 2024-07-03 DIAGNOSIS — L659 Nonscarring hair loss, unspecified: Secondary | ICD-10-CM

## 2024-07-03 DIAGNOSIS — Z79899 Other long term (current) drug therapy: Secondary | ICD-10-CM | POA: Diagnosis not present

## 2024-07-03 DIAGNOSIS — I10 Essential (primary) hypertension: Secondary | ICD-10-CM

## 2024-07-03 DIAGNOSIS — G8929 Other chronic pain: Secondary | ICD-10-CM | POA: Diagnosis not present

## 2024-07-03 DIAGNOSIS — N1831 Chronic kidney disease, stage 3a: Secondary | ICD-10-CM | POA: Diagnosis not present

## 2024-07-03 DIAGNOSIS — F411 Generalized anxiety disorder: Secondary | ICD-10-CM | POA: Diagnosis not present

## 2024-07-03 DIAGNOSIS — E118 Type 2 diabetes mellitus with unspecified complications: Secondary | ICD-10-CM

## 2024-07-03 DIAGNOSIS — R3981 Functional urinary incontinence: Secondary | ICD-10-CM

## 2024-07-03 DIAGNOSIS — M79644 Pain in right finger(s): Secondary | ICD-10-CM | POA: Diagnosis not present

## 2024-07-03 DIAGNOSIS — M7989 Other specified soft tissue disorders: Secondary | ICD-10-CM | POA: Diagnosis not present

## 2024-07-03 DIAGNOSIS — K9089 Other intestinal malabsorption: Secondary | ICD-10-CM

## 2024-07-03 DIAGNOSIS — R011 Cardiac murmur, unspecified: Secondary | ICD-10-CM

## 2024-07-03 LAB — COMPREHENSIVE METABOLIC PANEL WITH GFR
ALT: 8 U/L (ref 0–35)
AST: 12 U/L (ref 0–37)
Albumin: 4.6 g/dL (ref 3.5–5.2)
Alkaline Phosphatase: 54 U/L (ref 39–117)
BUN: 13 mg/dL (ref 6–23)
CO2: 29 meq/L (ref 19–32)
Calcium: 9.8 mg/dL (ref 8.4–10.5)
Chloride: 103 meq/L (ref 96–112)
Creatinine, Ser: 0.83 mg/dL (ref 0.40–1.20)
GFR: 66.5 mL/min (ref >60.00)
Glucose, Bld: 98 mg/dL (ref 70–99)
Potassium: 4 meq/L (ref 3.5–5.1)
Sodium: 142 meq/L (ref 135–145)
Total Bilirubin: 1.1 mg/dL (ref 0.2–1.2)
Total Protein: 7 g/dL (ref 6.0–8.3)

## 2024-07-03 LAB — CBC WITH DIFFERENTIAL/PLATELET
Basophils Absolute: 0 K/uL (ref 0.0–0.1)
Basophils Relative: 0.6 % (ref 0.0–3.0)
Eosinophils Absolute: 0 K/uL (ref 0.0–0.7)
Eosinophils Relative: 0.7 % (ref 0.0–5.0)
HCT: 41.1 % (ref 36.0–46.0)
Hemoglobin: 13.4 g/dL (ref 12.0–15.0)
Lymphocytes Relative: 27.2 % (ref 12.0–46.0)
Lymphs Abs: 1.8 K/uL (ref 0.7–4.0)
MCHC: 32.7 g/dL (ref 30.0–36.0)
MCV: 100.7 fl — ABNORMAL HIGH (ref 78.0–100.0)
Monocytes Absolute: 0.4 K/uL (ref 0.1–1.0)
Monocytes Relative: 5.9 % (ref 3.0–12.0)
Neutro Abs: 4.4 K/uL (ref 1.4–7.7)
Neutrophils Relative %: 65.6 % (ref 43.0–77.0)
Platelets: 205 K/uL (ref 150.0–400.0)
RBC: 4.08 Mil/uL (ref 3.87–5.11)
RDW: 15.4 % (ref 11.5–15.5)
WBC: 6.7 K/uL (ref 4.0–10.5)

## 2024-07-03 LAB — LIPID PANEL
Cholesterol: 115 mg/dL (ref 0–200)
HDL: 43.3 mg/dL (ref >39.00)
LDL Cholesterol: 45 mg/dL (ref 0–99)
NonHDL: 71.36
Total CHOL/HDL Ratio: 3
Triglycerides: 133 mg/dL (ref 0.0–149.0)
VLDL: 26.6 mg/dL (ref 0.0–40.0)

## 2024-07-03 LAB — URINALYSIS, ROUTINE W REFLEX MICROSCOPIC
Bilirubin Urine: NEGATIVE
Hgb urine dipstick: NEGATIVE
Ketones, ur: NEGATIVE
Nitrite: NEGATIVE
Specific Gravity, Urine: <=1.005 — AB (ref 1.000–1.030)
Total Protein, Urine: NEGATIVE
Urine Glucose: NEGATIVE
Urobilinogen, UA: 0.2 (ref 0.0–1.0)
pH: 6 (ref 5.0–8.0)

## 2024-07-03 LAB — MICROALBUMIN / CREATININE URINE RATIO
Creatinine,U: 29.4 mg/dL
Microalb Creat Ratio: 64.7 mg/g — ABNORMAL HIGH (ref 0.0–30.0)
Microalb, Ur: 1.9 mg/dL (ref 0.0–1.9)

## 2024-07-03 LAB — HEMOGLOBIN A1C: Hgb A1c MFr Bld: 6.5 % (ref 4.6–6.5)

## 2024-07-03 LAB — TSH: TSH: 2.51 u[IU]/mL (ref 0.35–5.50)

## 2024-07-03 MED ORDER — LISINOPRIL 5 MG PO TABS: 5.0000 mg | ORAL_TABLET | Freq: Every day | ORAL | 3 refills | Status: AC

## 2024-07-03 MED ORDER — DICLOFENAC SODIUM 1 % EX GEL: 2.0000 g | Freq: Four times a day (QID) | CUTANEOUS | 3 refills | Status: AC

## 2024-07-03 MED ORDER — MINOXIDIL 5 % EX FOAM: 1.0000 | Freq: Every day | CUTANEOUS | 3 refills | Status: AC

## 2024-07-03 MED ORDER — DULAGLUTIDE 0.75 MG/0.5ML ~~LOC~~ SOAJ: 0.7500 mg | SUBCUTANEOUS | 1 refills | Status: DC

## 2024-07-03 NOTE — Progress Notes (Signed)
 Acute Office Visit  Subjective:     Patient ID: Sydney Avila, female    DOB: 07/30/1946, 78 y.o.   MRN: [id number]  Chief Complaint  Patient presents with   Urinary Incontinence    HPI  Discussed the use of AI scribe software for clinical note transcription with the patient, who gave verbal consent to proceed.  History of Present Illness Sydney Avila is an 78 year old female who presents for a follow-up on her blood pressure and to discuss hair loss and bladder issues.  Blood pressure fluctuations - Blood pressure fluctuates, occasionally spiking to 150 mmHg under stress - Home blood pressure monitoring shows consistent readings around 138-140 mmHg  Alopecia and scalp symptoms - Progressive hair loss, particularly during showers - Associated scalp itching and dryness - No visible flaking of the scalp - Family history of hair thinning  Urinary incontinence and nocturia - Urinary incontinence with sneezing or at night, requiring pad use - Nocturia at least once per night, up to three times when the house is hot - Daily fluid intake of 90 to 120 ounces, mainly caffeine-free tea and Crystal Light  Thumb pain and swelling - Persistent swelling and pain in the thumb, especially at the knuckle - Symptoms worsen with activities such as using scissors  Sleep disturbance and anxiety - Frequent sleep disturbances, often waking at 3 AM due to worry - Recent weight loss - Currently taking alprazolam      ROS Per HPI      Objective:    BP 136/78 (BP Location: Left Arm, Patient Position: Sitting)   Pulse 74   Temp 97.8 F (36.6 C) (Temporal)   Ht 5' 2.5 (1.588 m)   Wt 137 lb 9.6 oz (62.4 kg)   SpO2 95%   BMI 24.77 kg/m    Physical Exam Vitals and nursing note reviewed.  Constitutional:      General: She is not in acute distress.    Appearance: Normal appearance. She is normal weight.  HENT:     Head: Normocephalic and atraumatic.     Right Ear:  External ear normal.     Left Ear: External ear normal.     Nose: Nose normal.     Mouth/Throat:     Mouth: Mucous membranes are moist.     Pharynx: Oropharynx is clear.  Eyes:     Extraocular Movements: Extraocular movements intact.  Cardiovascular:     Rate and Rhythm: Normal rate and regular rhythm.     Pulses: Normal pulses.     Heart sounds: Murmur heard.  Pulmonary:     Effort: Pulmonary effort is normal. No respiratory distress.     Breath sounds: Normal breath sounds. No wheezing, rhonchi or rales.  Musculoskeletal:        General: Normal range of motion.     Cervical back: Normal range of motion.     Right lower leg: No edema.     Left lower leg: No edema.  Lymphadenopathy:     Cervical: No cervical adenopathy.  Skin:    Comments: Thinning hair  Neurological:     General: No focal deficit present.     Mental Status: She is alert and oriented to person, place, and time.  Psychiatric:        Mood and Affect: Mood normal.        Thought Content: Thought content normal.     Results for orders placed or performed in visit on 07/03/24  Urine Culture   Specimen: Urine  Result Value Ref Range   Source: URINE    Status: FINAL    Isolate 1: Klebsiella pneumoniae (A)       Susceptibility   Klebsiella pneumoniae - URINE CULTURE, REFLEX    AMOX/CLAVULANIC 8 Sensitive     AMPICILLIN/SULBACTAM 8 Sensitive     CEFAZOLIN* 2 Not Reportable      * For infections other than uncomplicated UTI caused by E. coli, K. pneumoniae or P. mirabilis: Cefazolin is resistant if MIC > or = 8 mcg/mL. (Distinguishing susceptible versus intermediate for isolates with MIC < or = 4 mcg/mL requires additional testing.) For uncomplicated UTI caused by E. coli, K. pneumoniae or P. mirabilis: Cefazolin is susceptible if MIC <32 mcg/mL and predicts susceptible to the oral agents cefaclor, cefdinir , cefpodoxime, cefprozil, cefuroxime, cephalexin and loracarbef.     CEFTAZIDIME <=0.5 Sensitive      CEFEPIME <=0.12 Sensitive     CEFTRIAXONE <=0.25 Sensitive     CIPROFLOXACIN <=0.06 Sensitive     LEVOFLOXACIN <=0.12 Sensitive     GENTAMICIN <=1 Sensitive     IMIPENEM <=0.25 Sensitive     MEROPENEM <=0.25 Sensitive     NITROFURANTOIN  32 Sensitive     PIP/TAZO <=4 Sensitive     TRIMETH/SULFA* <=20 Sensitive      * For infections other than uncomplicated UTI caused by E. coli, K. pneumoniae or P. mirabilis: Cefazolin is resistant if MIC > or = 8 mcg/mL. (Distinguishing susceptible versus intermediate for isolates with MIC < or = 4 mcg/mL requires additional testing.) For uncomplicated UTI caused by E. coli, K. pneumoniae or P. mirabilis: Cefazolin is susceptible if MIC <32 mcg/mL and predicts susceptible to the oral agents cefaclor, cefdinir , cefpodoxime, cefprozil, cefuroxime, cephalexin and loracarbef. Legend: S = Susceptible  I = Intermediate R = Resistant  NS = Not susceptible SDD = Susceptible Dose Dependent * = Not Tested  NR = Not Reported **NN = See Therapy Comments   Urinalysis, Routine w reflex microscopic  Result Value Ref Range   Color, Urine YELLOW Yellow;Lt. Yellow;Straw;Dark Yellow;Amber;Green;Red;Brown   APPearance Sl Cloudy (A) Clear;Turbid;Slightly Cloudy;Cloudy   Specific Gravity, Urine <=1.005 (A) 1.000 - 1.030   pH 6.0 5.0 - 8.0   Total Protein, Urine NEGATIVE Negative   Urine Glucose NEGATIVE Negative   Ketones, ur NEGATIVE Negative   Bilirubin Urine NEGATIVE Negative   Hgb urine dipstick NEGATIVE Negative   Urobilinogen, UA 0.2 0.0 - 1.0   Leukocytes,Ua SMALL (A) Negative   Nitrite NEGATIVE Negative   WBC, UA 11-20/hpf (A) 0-2/hpf   RBC / HPF 0-2/hpf 0-2/hpf   Squamous Epithelial / HPF Rare(0-4/hpf) Rare(0-4/hpf)   Bacteria, UA Many(>50/hpf) (A) None  Microalbumin / creatinine urine ratio  Result Value Ref Range   Microalb, Ur 1.9 0.0 - 1.9 mg/dL   Creatinine,U 70.5 mg/dL   Microalb Creat Ratio 64.7 (H) 0.0 - 30.0 mg/g  TSH  Result  Value Ref Range   TSH 2.51 0.35 - 5.50 uIU/mL  Lipid panel  Result Value Ref Range   Cholesterol 115 0 - 200 mg/dL   Triglycerides 866.9 0.0 - 149.0 mg/dL   HDL 56.69 >60.99 mg/dL   VLDL 73.3 0.0 - 59.9 mg/dL   LDL Cholesterol 45 0 - 99 mg/dL   Total CHOL/HDL Ratio 3    NonHDL 71.36   Hemoglobin A1c  Result Value Ref Range   Hgb A1c MFr Bld 6.5 4.6 - 6.5 %  Comprehensive metabolic panel with GFR  Result Value Ref Range   Sodium 142 135 - 145 mEq/L   Potassium 4.0 3.5 - 5.1 mEq/L   Chloride 103 96 - 112 mEq/L   CO2 29 19 - 32 mEq/L   Glucose, Bld 98 70 - 99 mg/dL   BUN 13 6 - 23 mg/dL   Creatinine, Ser 9.16 0.40 - 1.20 mg/dL   Total Bilirubin 1.1 0.2 - 1.2 mg/dL   Alkaline Phosphatase 54 39 - 117 U/L   AST 12 0 - 37 U/L   ALT 8 0 - 35 U/L   Total Protein 7.0 6.0 - 8.3 g/dL   Albumin 4.6 3.5 - 5.2 g/dL   GFR 33.49 >39.99 mL/min   Calcium 9.8 8.4 - 10.5 mg/dL  CBC with Differential/Platelet  Result Value Ref Range   WBC 6.7 4.0 - 10.5 K/uL   RBC 4.08 3.87 - 5.11 Mil/uL   Hemoglobin 13.4 12.0 - 15.0 g/dL   HCT 58.8 63.9 - 53.9 %   MCV 100.7 (H) 78.0 - 100.0 fl   MCHC 32.7 30.0 - 36.0 g/dL   RDW 84.5 88.4 - 84.4 %   Platelets 205.0 150.0 - 400.0 K/uL   Neutrophils Relative % 65.6 43.0 - 77.0 %   Lymphocytes Relative 27.2 12.0 - 46.0 %   Monocytes Relative 5.9 3.0 - 12.0 %   Eosinophils Relative 0.7 0.0 - 5.0 %   Basophils Relative 0.6 0.0 - 3.0 %   Neutro Abs 4.4 1.4 - 7.7 K/uL   Lymphs Abs 1.8 0.7 - 4.0 K/uL   Monocytes Absolute 0.4 0.1 - 1.0 K/uL   Eosinophils Absolute 0.0 0.0 - 0.7 K/uL   Basophils Absolute 0.0 0.0 - 0.1 K/uL        Assessment & Plan:   Assessment and Plan Assessment & Plan Functional urinary incontinence  Urinary incontinence with nocturia and increased frequency, likely due to bladder prolapse post-hysterectomy. Discussed potential surgery based on symptom severity. - Provide pelvic floor exercise handout. - Advise to monitor fluid intake  and urination patterns. - Urine Culture today  Chronic right thumb pain  likely arthritis Persistent right thumb pain and swelling, likely arthritis-related, exacerbated by use. - Order x-ray of right thumb. - Prescribe topical diclofenac  gel for pain and swelling.  Essential Hypertension, benign Blood pressure generally controlled with lisinopril , occasional spikes under stress. Dosage adjustment needed. - Prescribe lisinopril  5 mg daily.  Type 2 diabetes mellitus, well controlled Diabetes well-controlled with A1c of 6.6%. Considering Trulicity  dose reduction due to weight loss and good control. - Order labs to support dosage adjustment.  Generalized anxiety  Significant anxiety related to personal and family responsibilities. Exercise recommended for management. - Encourage exercise and walking.  Hair Loss Progressive hair loss with itching and dryness, family history present. Topical treatment preferred. - Prescribe topical minoxidil  foam once daily.  Cardiac murmur Cardiac murmur stable, not concerning.  Stage IIIb CKD - CMP today, continue current hydration - Continue blood pressure control  Medication management - Labs today, will dose adjust as needed     Orders Placed This Encounter  Procedures   Urine Culture    Standing Status:   Future    Number of Occurrences:   1    Expected Date:   07/03/2024    Expiration Date:   07/03/2025   DG Finger Thumb Right    Standing Status:   Future    Number of Occurrences:   1    Expiration Date:   12/31/2024  Reason for Exam (SYMPTOM  OR DIAGNOSIS REQUIRED):   right thumb pain/swelling    Preferred imaging location?:   [facility]   Urinalysis, Routine w reflex microscopic    Standing Status:   Future    Number of Occurrences:   1    Expected Date:   07/03/2024    Expiration Date:   07/03/2025     Meds ordered this encounter  Medications   lisinopril  (ZESTRIL ) 5 MG tablet    Sig: Take 1 tablet (5 mg total)  by mouth daily.    Dispense:  90 tablet    Refill:  3   Minoxidil  5 % FOAM    Sig: Apply 1 Application topically daily.    Dispense:  60 g    Refill:  3   diclofenac  Sodium (VOLTAREN ) 1 % GEL    Sig: Apply 2 g topically 4 (four) times daily.    Dispense:  50 g    Refill:  3   DISCONTD: Dulaglutide  0.75 MG/0.5ML SOAJ    Sig: Inject 0.75 mg into the skin once a week.    Dispense:  6 mL    Refill:  1    Return in about 6 months (around 12/31/2024) for meds f/u.  Corean LITTIE Ku, FNP

## 2024-07-03 NOTE — Patient Instructions (Addendum)
 START lisinopril  5mg  once daily to treat blood pressure.   I have sent in minoxidil  foam for you to use 1 application to your hair once daily.   We are getting an xray today. We will be in contact with any abnormal results that require further attention.  I have sent in diclofenac  gel for you to use to your thumb up to 4 times per day for pain relief.  I have given you a handout for pelvic floor exercise to try to help your bladder.  Follow-up with me in 6 mos for medication management, sooner if needed.

## 2024-07-04 ENCOUNTER — Ambulatory Visit: Payer: Self-pay | Admitting: Family Medicine

## 2024-07-04 DIAGNOSIS — N3000 Acute cystitis without hematuria: Secondary | ICD-10-CM

## 2024-07-06 LAB — URINE CULTURE

## 2024-07-07 ENCOUNTER — Other Ambulatory Visit: Payer: Self-pay

## 2024-07-07 DIAGNOSIS — E118 Type 2 diabetes mellitus with unspecified complications: Secondary | ICD-10-CM

## 2024-07-07 MED ORDER — CEPHALEXIN 500 MG PO CAPS: 500.0000 mg | ORAL_CAPSULE | Freq: Two times a day (BID) | ORAL | 0 refills | Status: AC

## 2024-07-07 MED ORDER — DULAGLUTIDE 0.75 MG/0.5ML ~~LOC~~ SOAJ: 0.7500 mg | SUBCUTANEOUS | 1 refills | Status: DC

## 2024-07-10 DIAGNOSIS — L218 Other seborrheic dermatitis: Secondary | ICD-10-CM | POA: Diagnosis not present

## 2024-07-10 DIAGNOSIS — L820 Inflamed seborrheic keratosis: Secondary | ICD-10-CM | POA: Diagnosis not present

## 2024-07-13 DIAGNOSIS — R011 Cardiac murmur, unspecified: Secondary | ICD-10-CM | POA: Insufficient documentation

## 2024-07-13 DIAGNOSIS — R3981 Functional urinary incontinence: Secondary | ICD-10-CM | POA: Insufficient documentation

## 2024-08-15 ENCOUNTER — Other Ambulatory Visit: Payer: Self-pay | Admitting: Family Medicine

## 2024-08-15 ENCOUNTER — Other Ambulatory Visit: Payer: Self-pay | Admitting: Internal Medicine

## 2024-08-15 DIAGNOSIS — K9089 Other intestinal malabsorption: Secondary | ICD-10-CM

## 2024-08-19 ENCOUNTER — Ambulatory Visit: Admitting: Family Medicine

## 2024-08-25 ENCOUNTER — Telehealth: Payer: Self-pay

## 2024-08-25 ENCOUNTER — Ambulatory Visit: Payer: Self-pay

## 2024-08-25 ENCOUNTER — Other Ambulatory Visit: Payer: Self-pay | Admitting: Family Medicine

## 2024-08-25 DIAGNOSIS — E785 Hyperlipidemia, unspecified: Secondary | ICD-10-CM

## 2024-08-25 NOTE — Telephone Encounter (Signed)
 Copied from CRM #[id number]. Topic: General - Call Back - No Documentation >> Aug 25, 2024 12:55 PM Viola F wrote: Reason for CRM: Patient returned call from the clinic but there is no documentation. Please call patient back at [phone] (M)

## 2024-08-25 NOTE — Telephone Encounter (Signed)
 Copied from CRM [id number]. Topic: Clinical - Medication Question >> Aug 25, 2024  8:05 AM Pinkey ORN wrote: Reason for CRM: Medication Request >> Aug 25, 2024  8:06 AM Pinkey ORN wrote: Patient is requesting a medication be called into the pharmacy, states she has another UTI. Please follow up with patient.

## 2024-08-25 NOTE — Telephone Encounter (Signed)
 FYI Only or Action Required?: FYI only for provider: appointment scheduled on 08/26/2024 at 11 AM at [facility].  Patient was last seen in primary care on 07/03/2024 by Alvia Corean CROME, FNP.  Called Nurse Triage reporting Dysuria.  Symptoms began yesterday.  Interventions attempted: Rest, hydration, or home remedies.  Symptoms are: unchanged.  Triage Disposition: See Physician Within 24 Hours  Patient/caregiver understands and will follow disposition?: Yes  Copied from CRM #[id number]. Topic: Clinical - Red Word Triage >> Aug 25, 2024  4:09 PM Sydney Avila wrote: Red Word that prompted transfer to Nurse Triage: UTI dealing with burning when urinating. Patient has called in earlier to request a prescription for it but havent heard anything back from clinic.   Patient is asking if same medicine can be prescribed again from last month. Reason for Disposition  All other patients with painful urination  (Exception: [1] EITHER frequency or urgency AND [2] has on-call doctor.)  Answer Assessment - Initial Assessment Questions Patient reports burning with urination that she rates as a 7 out of 10. Endorses discomfort started yesterday. No availability at clinic tomorrow. Scheduled at an alternative clinic tomorrow at 11 AM.   1. SEVERITY: How bad is the pain?  (e.g., Scale 1-10; mild, moderate, or severe)     7 out of 10 2. FREQUENCY: How many times have you had painful urination today?      3-4 times today 3. PATTERN: Is pain present every time you urinate or just sometimes?      Every time 4. ONSET: When did the painful urination start?      Developed discomfort yesterday 5. FEVER: Do you have a fever? If Yes, ask: What is your temperature, how was it measured, and when did it start?     no 6. PAST UTI: Have you had a urine infection before? If Yes, ask: When was the last time? and What happened that time?      Yes-a year ago 7. CAUSE: What do you think is causing the  painful urination?  (e.g., UTI, scratch, Herpes sore)     UTI 8. OTHER SYMPTOMS: Do you have any other symptoms? (e.g., blood in urine, flank pain, genital sores, urgency, vaginal discharge)     frequency  Protocols used: Urination Pain - Female-A-AH

## 2024-08-26 ENCOUNTER — Encounter: Payer: Self-pay | Admitting: Family Medicine

## 2024-08-26 ENCOUNTER — Ambulatory Visit (INDEPENDENT_AMBULATORY_CARE_PROVIDER_SITE_OTHER): Admitting: Family Medicine

## 2024-08-26 VITALS — BP 132/74 | HR 76 | Temp 98.2°F | Ht 62.5 in | Wt 140.3 lb

## 2024-08-26 DIAGNOSIS — R300 Dysuria: Secondary | ICD-10-CM

## 2024-08-26 DIAGNOSIS — B3731 Acute candidiasis of vulva and vagina: Secondary | ICD-10-CM

## 2024-08-26 DIAGNOSIS — N3000 Acute cystitis without hematuria: Secondary | ICD-10-CM

## 2024-08-26 LAB — POC URINALSYSI DIPSTICK (AUTOMATED)
Bilirubin, UA: NEGATIVE
Blood, UA: NEGATIVE
Glucose, UA: NEGATIVE
Ketones, UA: NEGATIVE
Nitrite, UA: NEGATIVE
Protein, UA: NEGATIVE
Spec Grav, UA: 1.01 (ref 1.010–1.025)
Urobilinogen, UA: 0.2 U/dL
pH, UA: 6 (ref 5.0–8.0)

## 2024-08-26 MED ORDER — FLUCONAZOLE 150 MG PO TABS: 150.0000 mg | ORAL_TABLET | ORAL | 0 refills | Status: AC | PRN

## 2024-08-26 MED ORDER — CIPROFLOXACIN HCL 500 MG PO TABS: 500.0000 mg | ORAL_TABLET | Freq: Two times a day (BID) | ORAL | 0 refills | Status: AC

## 2024-08-26 NOTE — Telephone Encounter (Signed)
 Patient had an OV to address this today.

## 2024-08-26 NOTE — Telephone Encounter (Signed)
 Patient had an OV to discuss this today

## 2024-08-26 NOTE — Progress Notes (Signed)
 Acute Office Visit  Subjective:     Patient ID: Sydney Avila, female    DOB: Nov 27, 1945, 78 y.o.   MRN: [id number]  Chief Complaint  Patient presents with   Dysuria    X3 days    Dysuria   Discussed the use of AI scribe software for clinical note transcription with the patient, who gave verbal consent to proceed.  History of Present Illness   Sydney Avila is an 78 year old female with diabetes who presents with urinary symptoms and possible recurrent urinary tract infection.  She experiences frequent urination and urgency, with burning upon urination, especially after outdoor activities. She associates these symptoms with wearing pads while working outside. A history of urinary tract infections is noted, with the last one treated with Keflex in late September.  She is on Metformin  for diabetes and has concerns about blood sugar levels, which have recently increased to 155 from her usual 130. She attributes some urinary issues to a recent decrease in her Trulicity  dosage. No fever, chills, nausea, vomiting, or back pain are present. She experiences soreness from physical activity.        Review of Systems  Genitourinary:  Positive for dysuria.  All other systems reviewed and are negative.       Objective:    BP 132/74   Pulse 76   Temp 98.2 F (36.8 C) (Oral)   Ht 5' 2.5 (1.588 m)   Wt 140 lb 4.8 oz (63.6 kg)   SpO2 98%   BMI 25.25 kg/m    Physical Exam Vitals reviewed.  Constitutional:      Appearance: Normal appearance. She is normal weight.  Cardiovascular:     Rate and Rhythm: Normal rate and regular rhythm.  Pulmonary:     Effort: Pulmonary effort is normal.     Breath sounds: Normal breath sounds. No wheezing.  Abdominal:     General: Bowel sounds are normal.     Palpations: Abdomen is soft.     Tenderness: There is no right CVA tenderness or left CVA tenderness.  Neurological:     Mental Status: She is alert.     Results for orders  placed or performed in visit on 08/26/24  POCT Urinalysis Dipstick (Automated)  Result Value Ref Range   Color, UA yellow    Clarity, UA cloudy    Glucose, UA Negative Negative   Bilirubin, UA negative    Ketones, UA negative    Spec Grav, UA 1.010 1.010 - 1.025   Blood, UA negative    pH, UA 6.0 5.0 - 8.0   Protein, UA Negative Negative   Urobilinogen, UA 0.2 0.2 or 1.0 E.U./dL   Nitrite, UA negative    Leukocytes, UA Small (1+) (A) Negative        Assessment & Plan:   Problem List Items Addressed This Visit   None Visit Diagnoses       Dysuria    -  Primary   Relevant Orders   POCT Urinalysis Dipstick (Automated) (Completed)     Acute cystitis without hematuria       Relevant Medications   ciprofloxacin (CIPRO) 500 MG tablet   Other Relevant Orders   Urine Culture     Vaginal candidiasis       Relevant Medications   fluconazole  (DIFLUCAN ) 150 MG tablet     Assessment and Plan    Acute cystitis with dysuria Recurrent urinary tract infection with dysuria and increased urinary frequency.  Previous Klebsiella infection treated with Keflex. Current urinalysis shows small leukocytes, no blood or nitrites, suggesting possible ongoing infection. Differential includes recurrence of Klebsiella or new bacterial infection. - Prescribed Ciprofloxacin for urinary tract infection - Sent urine for culture to identify causative organism - Advised to monitor for symptoms of recurrent infection and discuss preventive measures if infections persist  Acute vulvovaginal candidiasis Anticipated yeast infection secondary to antibiotic use. Discussed prophylactic treatment to prevent yeast infection. - Prescribed Diflucan  (fluconazole ) with instructions to take first dose towards the end of antibiotic course and second dose if symptoms develop   Meds ordered this encounter  Medications   ciprofloxacin (CIPRO) 500 MG tablet    Sig: Take 1 tablet (500 mg total) by mouth 2 (two) times  daily for 5 days.    Dispense:  10 tablet    Refill:  0   fluconazole  (DIFLUCAN ) 150 MG tablet    Sig: Take 1 tablet (150 mg total) by mouth every three (3) days as needed.    Dispense:  2 tablet    Refill:  0    No follow-ups on file.  Heron CHRISTELLA Sharper, MD

## 2024-08-29 ENCOUNTER — Ambulatory Visit: Payer: Self-pay | Admitting: Family Medicine

## 2024-08-29 LAB — URINE CULTURE
MICRO NUMBER:: 17250377
SPECIMEN QUALITY:: ADEQUATE

## 2024-09-09 ENCOUNTER — Telehealth: Payer: Self-pay | Admitting: Family Medicine

## 2024-09-09 ENCOUNTER — Ambulatory Visit: Admitting: Family Medicine

## 2024-09-09 ENCOUNTER — Telehealth: Payer: Self-pay

## 2024-09-09 ENCOUNTER — Other Ambulatory Visit: Payer: Self-pay

## 2024-09-09 DIAGNOSIS — E118 Type 2 diabetes mellitus with unspecified complications: Secondary | ICD-10-CM

## 2024-09-09 MED ORDER — TRULICITY 1.5 MG/0.5ML ~~LOC~~ SOAJ: 1.5000 mg | SUBCUTANEOUS | 1 refills | Status: AC

## 2024-09-09 NOTE — Telephone Encounter (Signed)
 LVM for patient to return call in regards to today's visit.

## 2024-09-09 NOTE — Telephone Encounter (Signed)
Paperwork completed and faxed.  Patient notified.

## 2024-09-09 NOTE — Telephone Encounter (Signed)
 Patient dropped off document Documentation in reference to trulicity , to be filled out by provider. Patient requested to send it back via Fax within 7-days. Document is located in providers tray at front office.Please advise at [phone]

## 2024-09-19 ENCOUNTER — Other Ambulatory Visit: Payer: Self-pay | Admitting: Internal Medicine

## 2024-10-13 ENCOUNTER — Other Ambulatory Visit: Payer: Self-pay | Admitting: Family Medicine

## 2024-10-13 DIAGNOSIS — K9089 Other intestinal malabsorption: Secondary | ICD-10-CM

## 2024-10-13 NOTE — Telephone Encounter (Unsigned)
 Copied from CRM [id number]. Topic: Clinical - Medication Refill >> Oct 13, 2024 12:50 PM Shereese L wrote: Medication: cholestyramine  (QUESTRAN ) 4 g packet  Has the patient contacted their pharmacy? Yes (Agent: If no, request that the patient contact the pharmacy for the refill. If patient does not wish to contact the pharmacy document the reason why and proceed with request.) (Agent: If yes, when and what did the pharmacy advise?)  This is the patient's preferred pharmacy:  [facility] [id number] - [place], [place] - [address] [place] [address] Phone: [phone] Fax: [phone]  Is this the correct pharmacy for this prescription? Yes If no, delete pharmacy and type the correct one.   Has the prescription been filled recently? Yes  Is the patient out of the medication? Yes  Has the patient been seen for an appointment in the last year OR does the patient have an upcoming appointment? Yes  Can we respond through MyChart? Yes  Agent: Please be advised that Rx refills may take up to 3 business days. We ask that you follow-up with your pharmacy.

## 2024-10-15 MED ORDER — CHOLESTYRAMINE 4 G PO PACK: PACK | ORAL | 0 refills | Status: DC

## 2024-10-31 ENCOUNTER — Other Ambulatory Visit: Payer: Self-pay

## 2024-10-31 ENCOUNTER — Other Ambulatory Visit: Payer: Self-pay | Admitting: Family Medicine

## 2024-10-31 DIAGNOSIS — K9089 Other intestinal malabsorption: Secondary | ICD-10-CM

## 2024-10-31 DIAGNOSIS — E118 Type 2 diabetes mellitus with unspecified complications: Secondary | ICD-10-CM

## 2024-10-31 MED ORDER — METFORMIN HCL ER 500 MG PO TB24: 1000.0000 mg | ORAL_TABLET | Freq: Two times a day (BID) | ORAL | 3 refills | Status: AC

## 2024-11-12 ENCOUNTER — Telehealth: Payer: Self-pay

## 2024-11-12 NOTE — Telephone Encounter (Signed)
 Copied from CRM #[id number]. Topic: Clinical - Medication Refill >> Nov 12, 2024 10:39 AM Winona R wrote: Medication:  ALPRAZolam  (XANAX ) 0.5 MG tablet    Has the patient contacted their pharmacy? Yes- they stated they have been sending request  (Agent: If no, request that the patient contact the pharmacy for the refill. If patient does not wish to contact the pharmacy document the reason why and proceed with request.) (Agent: If yes, when and what did the pharmacy advise?)  This is the patient's preferred pharmacy:  [facility] [id number] - [place], [place] - [address] [place] [address] Phone: [phone] Fax: [phone]  Is this the correct pharmacy for this prescription? Yes If no, delete pharmacy and type the correct one.   Has the prescription been filled recently? Yes  Is the patient out of the medication? No- 3 days left   Has the patient been seen for an appointment in the last year OR does the patient have an upcoming appointment? Yes  Can we respond through MyChart? No  Agent: Please be advised that Rx refills may take up to 3 business days. We ask that you follow-up with your pharmacy.

## 2024-11-13 ENCOUNTER — Other Ambulatory Visit: Payer: Self-pay | Admitting: Family Medicine

## 2024-11-13 DIAGNOSIS — F411 Generalized anxiety disorder: Secondary | ICD-10-CM

## 2024-11-13 MED ORDER — ALPRAZOLAM 0.5 MG PO TABS: ORAL_TABLET | ORAL | 2 refills | Status: AC

## 2024-12-16 ENCOUNTER — Ambulatory Visit: Admitting: Family Medicine

## 2025-02-17 ENCOUNTER — Encounter: Admitting: Family Medicine

## 2025-02-17 ENCOUNTER — Ambulatory Visit
